# Patient Record
Sex: Male | Born: 1945 | Race: White | Hispanic: No | Marital: Single | Smoking: Never smoker
Health system: Southern US, Community
[De-identification: ages and names within clinical notes are randomized; demographics above are authoritative.]

## PROBLEM LIST (undated history)

## (undated) DIAGNOSIS — K7689 Other specified diseases of liver: Secondary | ICD-10-CM

## (undated) DIAGNOSIS — E7800 Pure hypercholesterolemia, unspecified: Secondary | ICD-10-CM

## (undated) DIAGNOSIS — E119 Type 2 diabetes mellitus without complications: Secondary | ICD-10-CM

## (undated) DIAGNOSIS — K7581 Nonalcoholic steatohepatitis (NASH): Secondary | ICD-10-CM

## (undated) DIAGNOSIS — I10 Essential (primary) hypertension: Secondary | ICD-10-CM

## (undated) DIAGNOSIS — N289 Disorder of kidney and ureter, unspecified: Secondary | ICD-10-CM

## (undated) DIAGNOSIS — E039 Hypothyroidism, unspecified: Secondary | ICD-10-CM

## (undated) DIAGNOSIS — R05 Cough: Secondary | ICD-10-CM

## (undated) DIAGNOSIS — D649 Anemia, unspecified: Secondary | ICD-10-CM

## (undated) DIAGNOSIS — M109 Gout, unspecified: Secondary | ICD-10-CM

## (undated) HISTORY — DX: Type 2 diabetes mellitus without complications: E11.9

## (undated) HISTORY — DX: Gout, unspecified: M10.9

## (undated) HISTORY — DX: Other specified diseases of liver: K76.89

## (undated) HISTORY — DX: Disorder of kidney and ureter, unspecified: N28.9

## (undated) HISTORY — DX: Cough: R05

## (undated) HISTORY — DX: Pure hypercholesterolemia, unspecified: E78.00

## (undated) HISTORY — DX: Nonalcoholic steatohepatitis (NASH): K75.81

## (undated) HISTORY — DX: Essential (primary) hypertension: I10

## (undated) HISTORY — DX: Hypothyroidism, unspecified: E03.9

## (undated) HISTORY — DX: Anemia, unspecified: D64.9

---

## 2003-11-05 ENCOUNTER — Encounter: Admission: RE | Admit: 2003-11-05 | Discharge: 2003-11-05 | Payer: Self-pay | Admitting: Endocrinology

## 2003-12-01 ENCOUNTER — Ambulatory Visit: Payer: Self-pay | Admitting: Endocrinology

## 2003-12-02 ENCOUNTER — Ambulatory Visit: Payer: Self-pay | Admitting: Endocrinology

## 2003-12-03 ENCOUNTER — Ambulatory Visit (HOSPITAL_COMMUNITY): Admission: RE | Admit: 2003-12-03 | Discharge: 2003-12-03 | Payer: Self-pay | Admitting: Endocrinology

## 2004-10-14 ENCOUNTER — Ambulatory Visit: Payer: Self-pay | Admitting: Endocrinology

## 2004-10-19 ENCOUNTER — Ambulatory Visit: Payer: Self-pay | Admitting: Endocrinology

## 2004-11-19 ENCOUNTER — Ambulatory Visit: Payer: Self-pay | Admitting: Endocrinology

## 2005-01-25 ENCOUNTER — Ambulatory Visit: Payer: Self-pay | Admitting: Endocrinology

## 2005-01-27 ENCOUNTER — Ambulatory Visit: Payer: Self-pay | Admitting: Endocrinology

## 2005-05-27 ENCOUNTER — Ambulatory Visit: Payer: Self-pay | Admitting: Endocrinology

## 2005-05-31 ENCOUNTER — Ambulatory Visit: Payer: Self-pay | Admitting: Endocrinology

## 2006-01-16 ENCOUNTER — Ambulatory Visit: Payer: Self-pay | Admitting: Endocrinology

## 2006-01-16 LAB — CONVERTED CEMR LAB
ALT: 33 units/L (ref 0–40)
AST: 32 units/L (ref 0–37)
Albumin: 3.9 g/dL (ref 3.5–5.2)
Alkaline Phosphatase: 50 units/L (ref 39–117)
BUN: 14 mg/dL (ref 6–23)
Basophils Absolute: 0.1 10*3/uL (ref 0.0–0.1)
Basophils Relative: 0.9 % (ref 0.0–1.0)
Bilirubin Urine: NEGATIVE
CO2: 32 meq/L (ref 19–32)
Calcium: 9.3 mg/dL (ref 8.4–10.5)
Chloride: 101 meq/L (ref 96–112)
Chol/HDL Ratio, serum: 4.1
Cholesterol: 172 mg/dL (ref 0–200)
Creatinine, Ser: 1.4 mg/dL (ref 0.4–1.5)
Creatinine,U: 189.7 mg/dL
Eosinophil percent: 2.9 % (ref 0.0–5.0)
GFR calc non Af Amer: 55 mL/min
Glomerular Filtration Rate, Af Am: 67 mL/min/{1.73_m2}
Glucose, Bld: 129 mg/dL — ABNORMAL HIGH (ref 70–99)
HCT: 45.8 % (ref 39.0–52.0)
HDL: 41.6 mg/dL (ref >39.0)
Hemoglobin, Urine: NEGATIVE
Hemoglobin: 15.4 g/dL (ref 13.0–17.0)
Hgb A1c MFr Bld: 6 % (ref 4.6–6.0)
Ketones, ur: NEGATIVE mg/dL
LDL DIRECT: 74.9 mg/dL
Leukocytes, UA: NEGATIVE
Lymphocytes Relative: 42.7 % (ref 12.0–46.0)
MCHC: 33.7 g/dL (ref 30.0–36.0)
MCV: 90.6 fL (ref 78.0–100.0)
Microalb Creat Ratio: 4.2 mg/g (ref 0.0–30.0)
Microalb, Ur: 0.8 mg/dL (ref 0.0–1.9)
Monocytes Absolute: 0.7 10*3/uL (ref 0.2–0.7)
Monocytes Relative: 7 % (ref 3.0–11.0)
Neutro Abs: 5 10*3/uL (ref 1.4–7.7)
Neutrophils Relative %: 46.5 % (ref 43.0–77.0)
Nitrite: NEGATIVE
PSA: 0.78 ng/mL (ref 0.10–4.00)
Platelets: 239 10*3/uL (ref 150–400)
Potassium: 4.6 meq/L (ref 3.5–5.1)
RBC: 5.06 M/uL (ref 4.22–5.81)
RDW: 14 % (ref 11.5–14.6)
Sodium: 139 meq/L (ref 135–145)
Specific Gravity, Urine: 1.02 (ref 1.000–1.03)
TSH: 4.94 microintl units/mL (ref 0.35–5.50)
Total Bilirubin: 0.9 mg/dL (ref 0.3–1.2)
Total Protein, Urine: NEGATIVE mg/dL
Total Protein: 7.4 g/dL (ref 6.0–8.3)
Triglyceride fasting, serum: 400 mg/dL (ref 0–149)
Uric Acid, Serum: 4.9 mg/dL (ref 2.4–7.0)
Urine Glucose: NEGATIVE mg/dL
Urobilinogen, UA: 0.2 (ref 0.0–1.0)
VLDL: 80 mg/dL — ABNORMAL HIGH (ref 0–40)
WBC: 10.6 10*3/uL — ABNORMAL HIGH (ref 4.5–10.5)
pH: 6 (ref 5.0–8.0)

## 2006-01-19 ENCOUNTER — Ambulatory Visit: Payer: Self-pay | Admitting: Endocrinology

## 2006-02-06 ENCOUNTER — Encounter: Admission: RE | Admit: 2006-02-06 | Discharge: 2006-02-06 | Payer: Self-pay | Admitting: Endocrinology

## 2006-07-20 ENCOUNTER — Ambulatory Visit: Payer: Self-pay | Admitting: Endocrinology

## 2006-07-20 LAB — CONVERTED CEMR LAB
ALT: 32 units/L (ref 0–53)
AST: 30 units/L (ref 0–37)
Albumin: 4 g/dL (ref 3.5–5.2)
Alkaline Phosphatase: 80 units/L (ref 39–117)
Bilirubin, Direct: 0.1 mg/dL (ref 0.0–0.3)
Cholesterol: 228 mg/dL (ref 0–200)
Direct LDL: 55.5 mg/dL
HDL: 42.5 mg/dL (ref >39.0)
Hgb A1c MFr Bld: 13.5 % — ABNORMAL HIGH (ref 4.6–6.0)
Total Bilirubin: 1.4 mg/dL — ABNORMAL HIGH (ref 0.3–1.2)
Total CHOL/HDL Ratio: 5.4
Total Protein: 7.9 g/dL (ref 6.0–8.3)
Triglycerides: 978 mg/dL (ref 0–149)
Uric Acid, Serum: 5.3 mg/dL (ref 2.4–7.0)
VLDL: 196 mg/dL — ABNORMAL HIGH (ref 0–40)

## 2006-07-21 ENCOUNTER — Ambulatory Visit: Payer: Self-pay | Admitting: Endocrinology

## 2006-07-28 ENCOUNTER — Ambulatory Visit: Payer: Self-pay | Admitting: Endocrinology

## 2006-08-17 ENCOUNTER — Encounter: Payer: Self-pay | Admitting: Endocrinology

## 2006-08-17 DIAGNOSIS — I10 Essential (primary) hypertension: Secondary | ICD-10-CM

## 2006-08-17 DIAGNOSIS — E039 Hypothyroidism, unspecified: Secondary | ICD-10-CM | POA: Insufficient documentation

## 2006-08-17 HISTORY — DX: Essential (primary) hypertension: I10

## 2006-08-17 HISTORY — DX: Hypothyroidism, unspecified: E03.9

## 2006-08-25 ENCOUNTER — Ambulatory Visit: Payer: Self-pay | Admitting: Endocrinology

## 2006-10-23 ENCOUNTER — Ambulatory Visit: Payer: Self-pay | Admitting: Endocrinology

## 2006-10-23 LAB — CONVERTED CEMR LAB
ALT: 21 units/L (ref 0–53)
AST: 28 units/L (ref 0–37)
Albumin: 4.1 g/dL (ref 3.5–5.2)
Alkaline Phosphatase: 41 units/L (ref 39–117)
BUN: 20 mg/dL (ref 6–23)
Bilirubin, Direct: 0.1 mg/dL (ref 0.0–0.3)
CO2: 30 meq/L (ref 19–32)
Calcium: 9.8 mg/dL (ref 8.4–10.5)
Chloride: 106 meq/L (ref 96–112)
Cholesterol: 119 mg/dL (ref 0–200)
Creatinine, Ser: 1.7 mg/dL — ABNORMAL HIGH (ref 0.4–1.5)
GFR calc Af Amer: 53 mL/min
GFR calc non Af Amer: 44 mL/min
Glucose, Bld: 98 mg/dL (ref 70–99)
HDL: 40.3 mg/dL (ref >39.0)
Hgb A1c MFr Bld: 5.7 % (ref 4.6–6.0)
LDL Cholesterol: 50 mg/dL (ref 0–99)
Potassium: 4.4 meq/L (ref 3.5–5.1)
Sodium: 144 meq/L (ref 135–145)
Total Bilirubin: 0.6 mg/dL (ref 0.3–1.2)
Total CHOL/HDL Ratio: 3
Total Protein: 7.4 g/dL (ref 6.0–8.3)
Triglycerides: 144 mg/dL (ref 0–149)
VLDL: 29 mg/dL (ref 0–40)

## 2006-10-25 ENCOUNTER — Ambulatory Visit: Payer: Self-pay | Admitting: Endocrinology

## 2007-02-14 ENCOUNTER — Encounter: Payer: Self-pay | Admitting: Endocrinology

## 2007-02-15 ENCOUNTER — Encounter: Payer: Self-pay | Admitting: Endocrinology

## 2007-04-27 ENCOUNTER — Ambulatory Visit: Payer: Self-pay | Admitting: Endocrinology

## 2007-04-29 LAB — CONVERTED CEMR LAB
ALT: 22 units/L (ref 0–53)
AST: 32 units/L (ref 0–37)
Albumin: 4.3 g/dL (ref 3.5–5.2)
Alkaline Phosphatase: 54 units/L (ref 39–117)
BUN: 27 mg/dL — ABNORMAL HIGH (ref 6–23)
Basophils Absolute: 0 10*3/uL (ref 0.0–0.1)
Basophils Relative: 0 % (ref 0.0–1.0)
Bilirubin Urine: NEGATIVE
Bilirubin, Direct: 0.1 mg/dL (ref 0.0–0.3)
CO2: 34 meq/L — ABNORMAL HIGH (ref 19–32)
Calcium: 10.1 mg/dL (ref 8.4–10.5)
Chloride: 101 meq/L (ref 96–112)
Cholesterol: 133 mg/dL (ref 0–200)
Creatinine, Ser: 1.5 mg/dL (ref 0.4–1.5)
Eosinophils Absolute: 0.2 10*3/uL (ref 0.0–0.7)
Eosinophils Relative: 1.6 % (ref 0.0–5.0)
GFR calc Af Amer: 62 mL/min
GFR calc non Af Amer: 51 mL/min
Glucose, Bld: 105 mg/dL — ABNORMAL HIGH (ref 70–99)
HCT: 46.6 % (ref 39.0–52.0)
HDL: 43.5 mg/dL (ref >39.0)
Hemoglobin, Urine: NEGATIVE
Hemoglobin: 15.5 g/dL (ref 13.0–17.0)
Hgb A1c MFr Bld: 5.7 % (ref 4.6–6.0)
Ketones, ur: NEGATIVE mg/dL
LDL Cholesterol: 60 mg/dL (ref 0–99)
Leukocytes, UA: NEGATIVE
Lymphocytes Relative: 42.1 % (ref 12.0–46.0)
MCHC: 33.2 g/dL (ref 30.0–36.0)
MCV: 91.9 fL (ref 78.0–100.0)
Monocytes Absolute: 0.7 10*3/uL (ref 0.1–1.0)
Monocytes Relative: 6.2 % (ref 3.0–12.0)
Neutro Abs: 5.6 10*3/uL (ref 1.4–7.7)
Neutrophils Relative %: 50.1 % (ref 43.0–77.0)
Nitrite: NEGATIVE
PSA: 0.86 ng/mL (ref 0.10–4.00)
Platelets: 213 10*3/uL (ref 150–400)
Potassium: 4.1 meq/L (ref 3.5–5.1)
RBC: 5.07 M/uL (ref 4.22–5.81)
RDW: 14.3 % (ref 11.5–14.6)
Sodium: 142 meq/L (ref 135–145)
Specific Gravity, Urine: >=1.03 (ref 1.000–1.03)
TSH: 5.71 microintl units/mL — ABNORMAL HIGH (ref 0.35–5.50)
Total Bilirubin: 0.7 mg/dL (ref 0.3–1.2)
Total CHOL/HDL Ratio: 3.1
Total Protein, Urine: 30 mg/dL — AB
Total Protein: 8 g/dL (ref 6.0–8.3)
Triglycerides: 149 mg/dL (ref 0–149)
Uric Acid, Serum: 4 mg/dL (ref 4.0–7.8)
Urine Glucose: NEGATIVE mg/dL
Urobilinogen, UA: 0.2 (ref 0.0–1.0)
VLDL: 30 mg/dL (ref 0–40)
WBC: 11.3 10*3/uL — ABNORMAL HIGH (ref 4.5–10.5)
pH: 5 (ref 5.0–8.0)

## 2007-05-02 ENCOUNTER — Ambulatory Visit: Payer: Self-pay | Admitting: Endocrinology

## 2007-05-02 DIAGNOSIS — E1169 Type 2 diabetes mellitus with other specified complication: Secondary | ICD-10-CM | POA: Insufficient documentation

## 2007-05-02 DIAGNOSIS — E7800 Pure hypercholesterolemia, unspecified: Secondary | ICD-10-CM

## 2007-05-02 DIAGNOSIS — E785 Hyperlipidemia, unspecified: Secondary | ICD-10-CM

## 2007-05-02 HISTORY — DX: Pure hypercholesterolemia, unspecified: E78.00

## 2007-05-18 ENCOUNTER — Ambulatory Visit: Payer: Self-pay | Admitting: Internal Medicine

## 2007-06-01 ENCOUNTER — Ambulatory Visit: Payer: Self-pay | Admitting: Internal Medicine

## 2007-06-01 ENCOUNTER — Encounter: Payer: Self-pay | Admitting: Internal Medicine

## 2007-06-01 LAB — HM COLONOSCOPY

## 2007-06-07 ENCOUNTER — Encounter: Payer: Self-pay | Admitting: Internal Medicine

## 2007-08-22 IMAGING — US US CAROTID DUPLEX BILAT
1 series · 14 of 24 positions shown · non-contrast
Comparison: none

CLINICAL DATA: Hypertension.  Abnormal ophthalmologic exam.
 BILATERAL CAROTID DUPLEX DOPPLER ULTRASOUND:
 The following Doppler flow velocity measurements were obtained (in cm/sec):

[Series 1: unknown · 0.07mm/px · 14 of 52 slices shown]
[im 1/52]
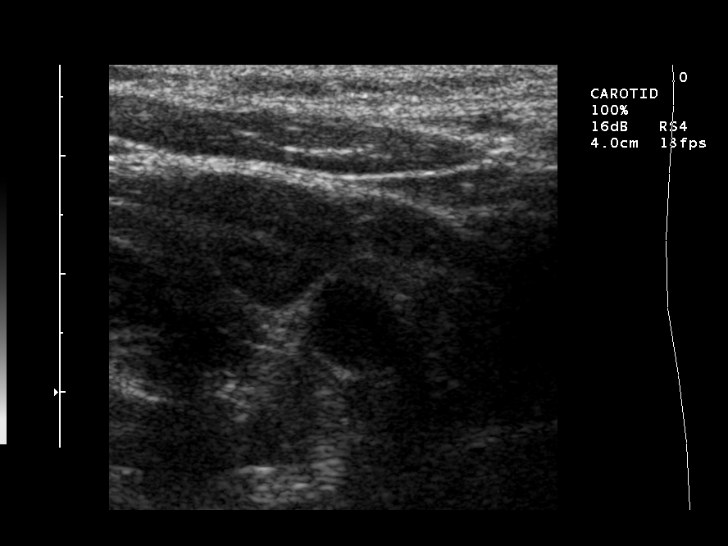
[im 5/52]
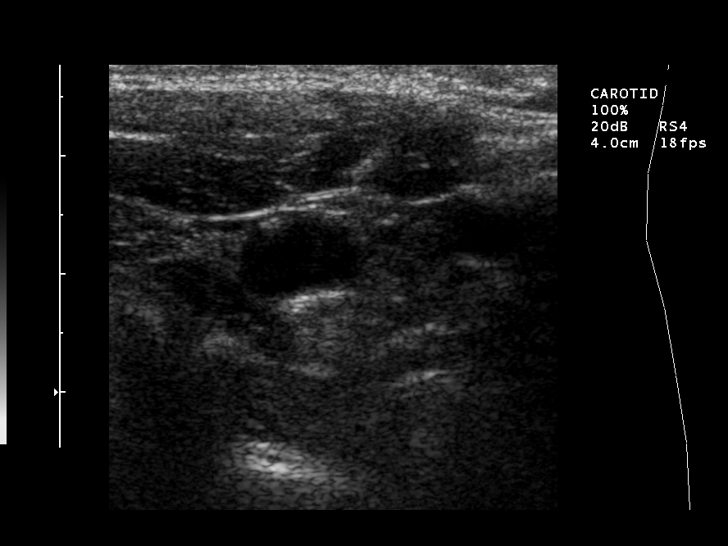
[im 9/52]
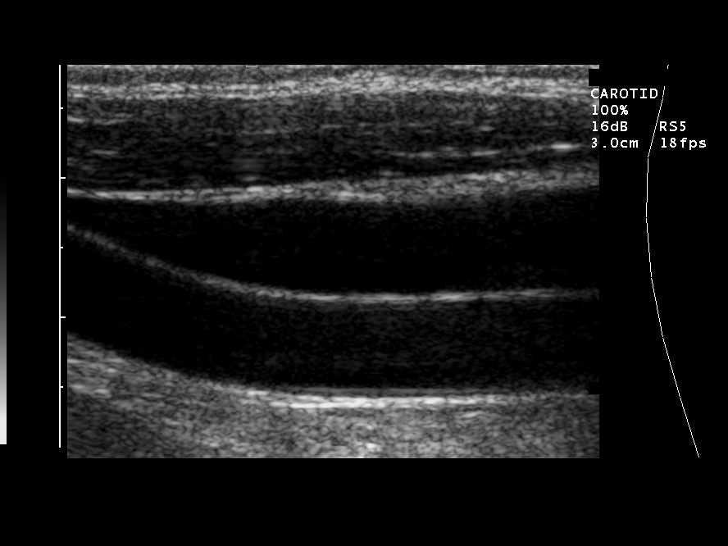
[im 14/52]
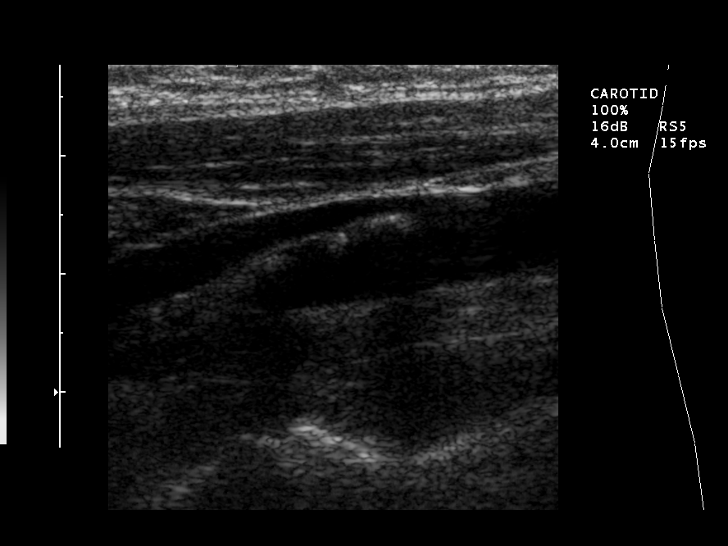
[im 16/52]
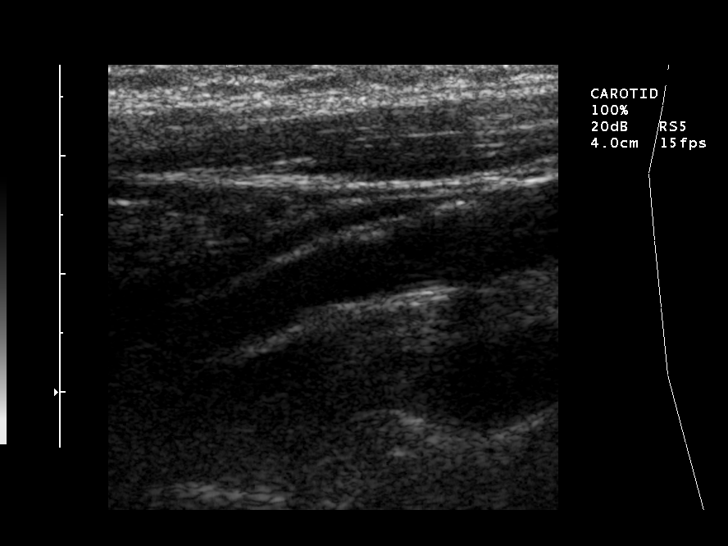
[im 20/52]
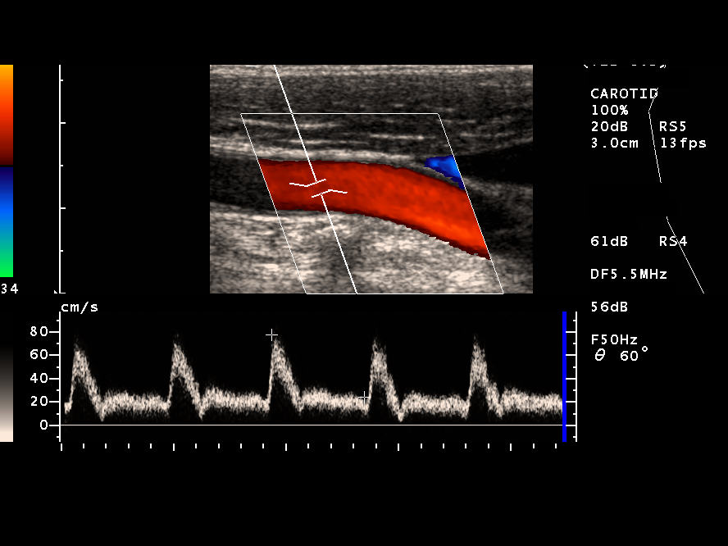
[im 25/52]
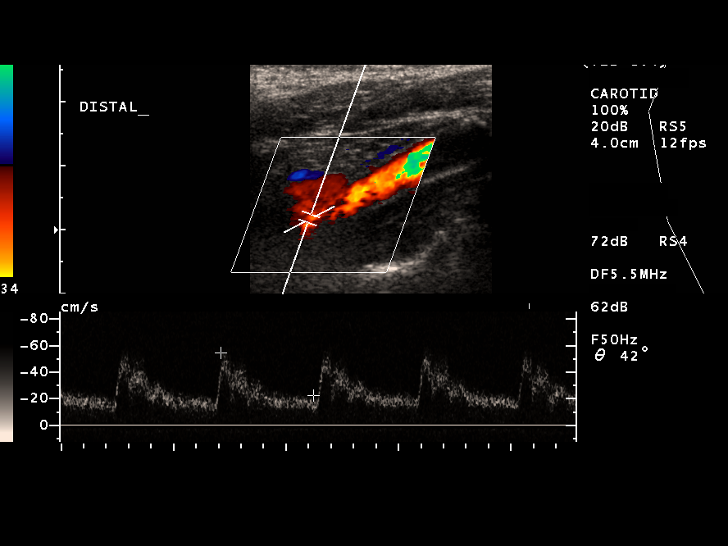
[im 27/52]
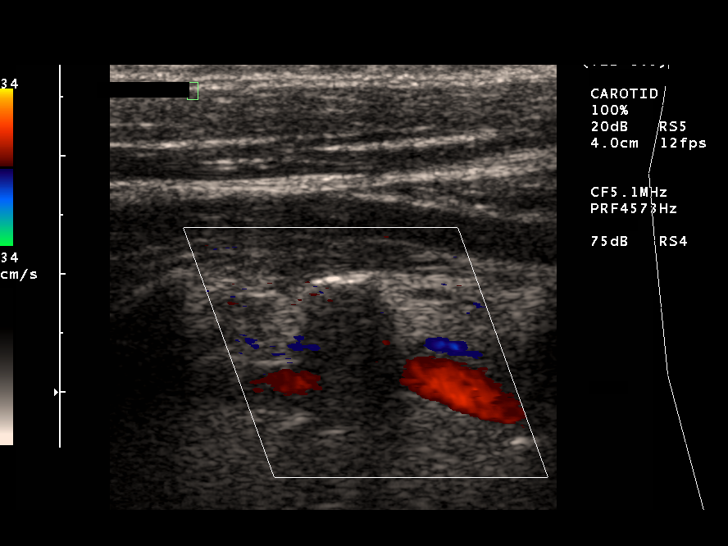
[im 32/52]
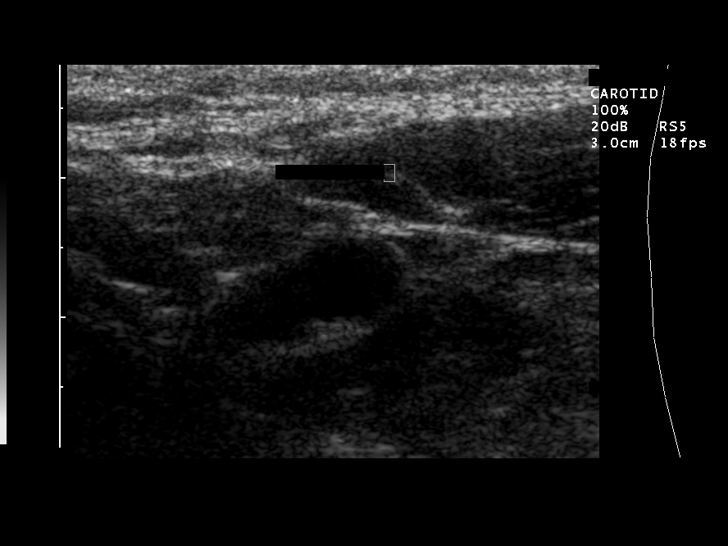
[im 36/52]
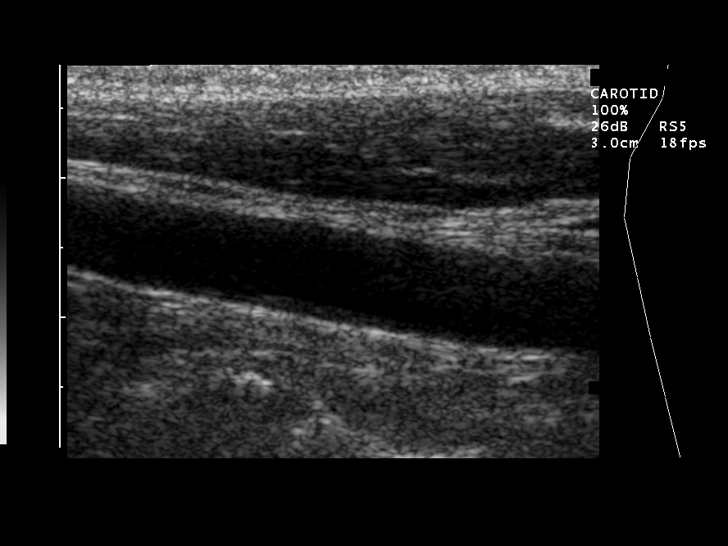
[im 40/52]
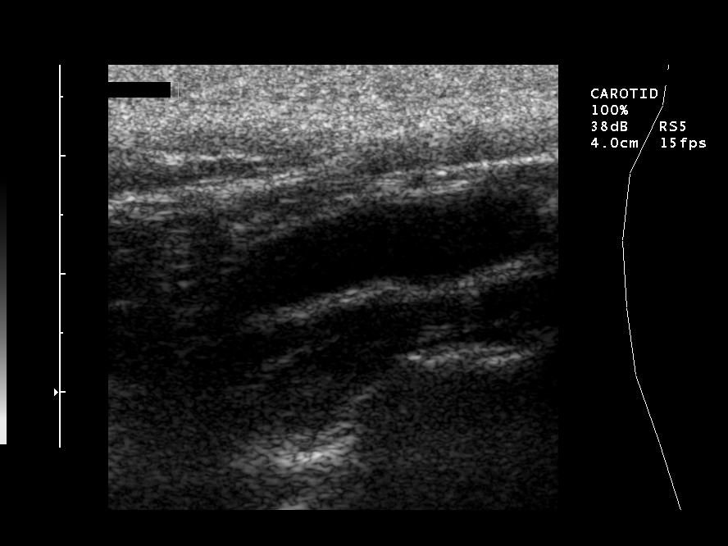
[im 43/52]
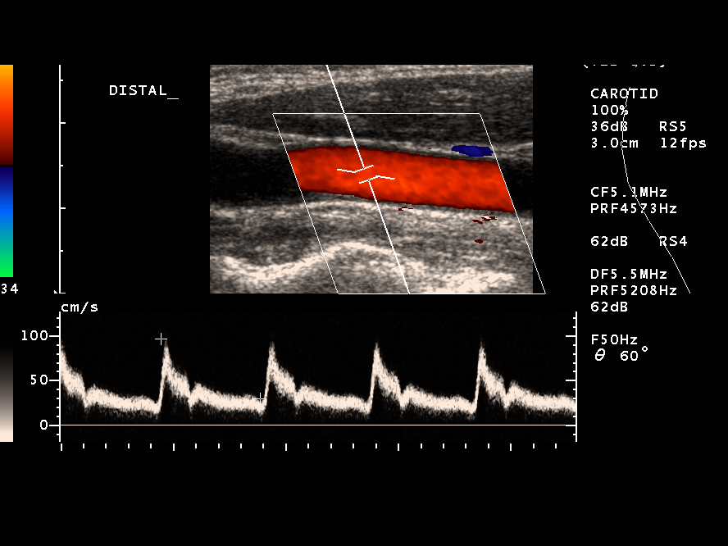
[im 47/52]
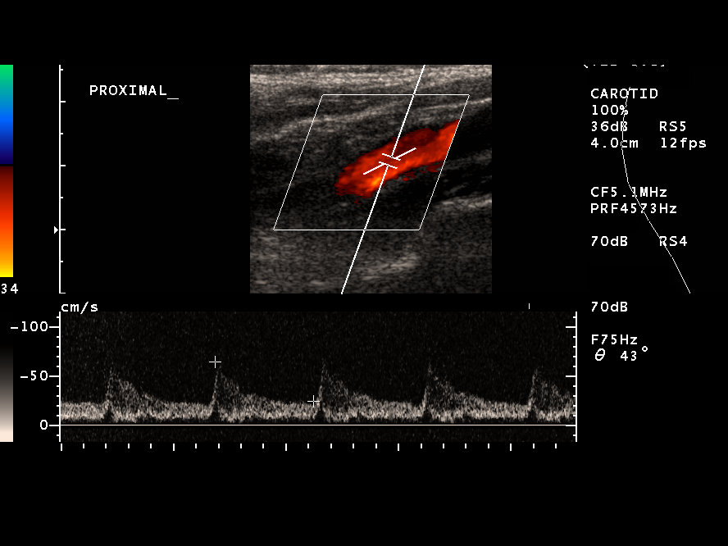
[im 52/52]
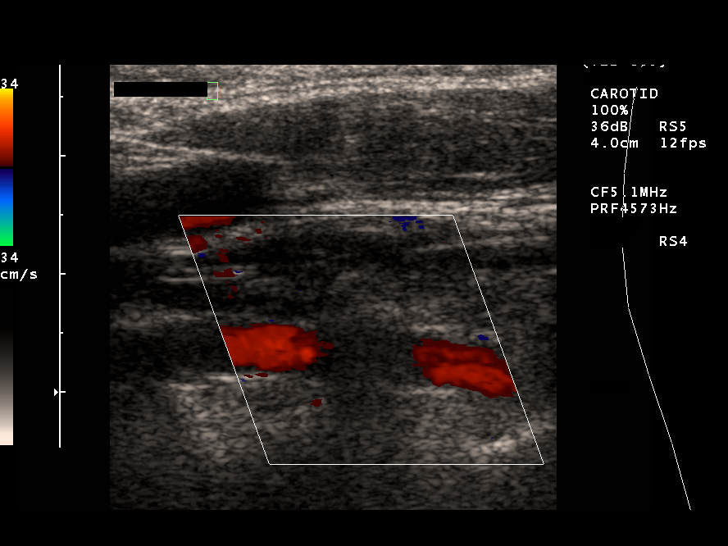

[14 of 24 positions shown; findings below may reference images not displayed]

SITE:  PEAK SYSTOLIC  END DIASTOLIC

 RIGHT  ICA:      89  31
 RIGHT CCA:  77  24
 RIGHT ICA/CCA RATIO:
 RIGHT ECA:  105
 LEFT ICA:  74  25
 LEFT CCA:  96    29
 LEFT ICA/CCA RATIO:  .77  .84
 LEFT ECA:  80
 Criteria:  Quantification of carotid stenosis is based on velocity parameters that correlate the residual internal carotid diameter with NASCET-based stenosis levels.
FINDINGS: There is minimal soft and calcified plaque in the right ICA bulb.  Doppler analysis demonstrates a low resistance waveform with sharp upstroke.  The right vertebral is antegrade in flow.  There is minimal predominantly soft plaque in the left ICA bulb.  Doppler analysis demonstrates a low resistance waveform with sharp upstroke.  The left vertebral antegrade in flow.
IMPRESSION: Estimated stenosis in the right and left ICA?s is 0 to 50% and 0 to 50% respectively.

## 2008-06-06 ENCOUNTER — Ambulatory Visit: Payer: Self-pay | Admitting: Endocrinology

## 2008-06-06 LAB — CONVERTED CEMR LAB
ALT: 59 units/L — ABNORMAL HIGH (ref 0–53)
AST: 41 units/L — ABNORMAL HIGH (ref 0–37)
Albumin: 4 g/dL (ref 3.5–5.2)
Alkaline Phosphatase: 63 units/L (ref 39–117)
BUN: 30 mg/dL — ABNORMAL HIGH (ref 6–23)
Basophils Absolute: 0.1 10*3/uL (ref 0.0–0.1)
Basophils Relative: 0.9 % (ref 0.0–3.0)
Bilirubin Urine: NEGATIVE
Bilirubin, Direct: 0.1 mg/dL (ref 0.0–0.3)
CO2: 29 meq/L (ref 19–32)
Calcium: 9.6 mg/dL (ref 8.4–10.5)
Chloride: 107 meq/L (ref 96–112)
Cholesterol: 197 mg/dL (ref 0–200)
Creatinine, Ser: 1.9 mg/dL — ABNORMAL HIGH (ref 0.4–1.5)
Direct LDL: 85.2 mg/dL
Eosinophils Absolute: 0.4 10*3/uL (ref 0.0–0.7)
Eosinophils Relative: 3.4 % (ref 0.0–5.0)
GFR calc non Af Amer: 38.49 mL/min (ref >60)
Glucose, Bld: 200 mg/dL — ABNORMAL HIGH (ref 70–99)
HCT: 44.5 % (ref 39.0–52.0)
HDL: 38.9 mg/dL — ABNORMAL LOW (ref >39.00)
Hemoglobin, Urine: NEGATIVE
Hemoglobin: 15.4 g/dL (ref 13.0–17.0)
Ketones, ur: NEGATIVE mg/dL
Leukocytes, UA: NEGATIVE
Lymphocytes Relative: 43.9 % (ref 12.0–46.0)
Lymphs Abs: 4.8 10*3/uL — ABNORMAL HIGH (ref 0.7–4.0)
MCHC: 34.6 g/dL (ref 30.0–36.0)
MCV: 90.7 fL (ref 78.0–100.0)
Monocytes Absolute: 0.7 10*3/uL (ref 0.1–1.0)
Monocytes Relative: 6.6 % (ref 3.0–12.0)
Neutro Abs: 5 10*3/uL (ref 1.4–7.7)
Neutrophils Relative %: 45.2 % (ref 43.0–77.0)
Nitrite: NEGATIVE
PSA: 0.49 ng/mL (ref 0.10–4.00)
Platelets: 209 10*3/uL (ref 150.0–400.0)
Potassium: 4.5 meq/L (ref 3.5–5.1)
RBC: 4.91 M/uL (ref 4.22–5.81)
RDW: 13.9 % (ref 11.5–14.6)
Sodium: 141 meq/L (ref 135–145)
Specific Gravity, Urine: >=1.03 (ref 1.000–1.030)
TSH: 8.76 microintl units/mL — ABNORMAL HIGH (ref 0.35–5.50)
Total Bilirubin: 1.1 mg/dL (ref 0.3–1.2)
Total CHOL/HDL Ratio: 5
Total Protein, Urine: NEGATIVE mg/dL
Total Protein: 7.5 g/dL (ref 6.0–8.3)
Triglycerides: 583 mg/dL — ABNORMAL HIGH (ref 0.0–149.0)
Urine Glucose: NEGATIVE mg/dL
Urobilinogen, UA: 0.2 (ref 0.0–1.0)
VLDL: 116.6 mg/dL — ABNORMAL HIGH (ref 0.0–40.0)
WBC: 11 10*3/uL — ABNORMAL HIGH (ref 4.5–10.5)
pH: 5 (ref 5.0–8.0)

## 2008-06-12 ENCOUNTER — Ambulatory Visit: Payer: Self-pay | Admitting: Endocrinology

## 2008-06-12 DIAGNOSIS — K7689 Other specified diseases of liver: Secondary | ICD-10-CM

## 2008-06-12 DIAGNOSIS — E119 Type 2 diabetes mellitus without complications: Secondary | ICD-10-CM

## 2008-06-12 DIAGNOSIS — K7581 Nonalcoholic steatohepatitis (NASH): Secondary | ICD-10-CM

## 2008-06-12 HISTORY — DX: Type 2 diabetes mellitus without complications: E11.9

## 2008-06-12 HISTORY — DX: Other specified diseases of liver: K76.89

## 2008-06-17 ENCOUNTER — Telehealth: Payer: Self-pay | Admitting: Endocrinology

## 2008-07-08 ENCOUNTER — Telehealth (INDEPENDENT_AMBULATORY_CARE_PROVIDER_SITE_OTHER): Payer: Self-pay | Admitting: *Deleted

## 2008-09-08 ENCOUNTER — Ambulatory Visit: Payer: Self-pay | Admitting: Endocrinology

## 2008-09-09 LAB — CONVERTED CEMR LAB
ALT: 22 units/L (ref 0–53)
AST: 26 units/L (ref 0–37)
Albumin: 4.1 g/dL (ref 3.5–5.2)
Alkaline Phosphatase: 49 units/L (ref 39–117)
Bilirubin, Direct: 0.1 mg/dL (ref 0.0–0.3)
Cholesterol: 145 mg/dL (ref 0–200)
HDL: 35.8 mg/dL — ABNORMAL LOW (ref >39.00)
Hgb A1c MFr Bld: 5.6 % (ref 4.6–6.5)
LDL Cholesterol: 81 mg/dL (ref 0–99)
Total Bilirubin: 0.9 mg/dL (ref 0.3–1.2)
Total CHOL/HDL Ratio: 4
Total Protein: 8.1 g/dL (ref 6.0–8.3)
Triglycerides: 142 mg/dL (ref 0.0–149.0)
Uric Acid, Serum: 3.4 mg/dL — ABNORMAL LOW (ref 4.0–7.8)
VLDL: 28.4 mg/dL (ref 0.0–40.0)

## 2008-09-10 ENCOUNTER — Ambulatory Visit: Payer: Self-pay | Admitting: Endocrinology

## 2008-09-10 DIAGNOSIS — R059 Cough, unspecified: Secondary | ICD-10-CM

## 2008-09-10 DIAGNOSIS — R05 Cough: Secondary | ICD-10-CM | POA: Insufficient documentation

## 2008-09-10 HISTORY — DX: Cough, unspecified: R05.9

## 2008-12-17 ENCOUNTER — Encounter: Payer: Self-pay | Admitting: Endocrinology

## 2008-12-29 ENCOUNTER — Ambulatory Visit: Payer: Self-pay | Admitting: Endocrinology

## 2008-12-30 ENCOUNTER — Ambulatory Visit: Payer: Self-pay | Admitting: Endocrinology

## 2008-12-30 LAB — CONVERTED CEMR LAB
Cholesterol: 171 mg/dL (ref 0–200)
Direct LDL: 93.3 mg/dL
HDL: 38.8 mg/dL — ABNORMAL LOW (ref >39.00)
Hgb A1c MFr Bld: 5.7 % (ref 4.6–6.5)
TSH: 2.38 microintl units/mL (ref 0.35–5.50)
Total CHOL/HDL Ratio: 4
Triglycerides: 221 mg/dL — ABNORMAL HIGH (ref 0.0–149.0)
VLDL: 44.2 mg/dL — ABNORMAL HIGH (ref 0.0–40.0)

## 2009-01-08 ENCOUNTER — Telehealth: Payer: Self-pay | Admitting: Endocrinology

## 2009-05-12 ENCOUNTER — Telehealth: Payer: Self-pay | Admitting: Endocrinology

## 2009-06-21 ENCOUNTER — Emergency Department (HOSPITAL_COMMUNITY): Admission: EM | Admit: 2009-06-21 | Discharge: 2009-06-21 | Payer: Self-pay | Admitting: Emergency Medicine

## 2009-06-22 ENCOUNTER — Telehealth: Payer: Self-pay | Admitting: Internal Medicine

## 2009-06-29 ENCOUNTER — Ambulatory Visit: Payer: Self-pay | Admitting: Endocrinology

## 2009-06-29 LAB — CONVERTED CEMR LAB
ALT: 65 units/L — ABNORMAL HIGH (ref 0–53)
AST: 54 units/L — ABNORMAL HIGH (ref 0–37)
Albumin: 4.2 g/dL (ref 3.5–5.2)
Alkaline Phosphatase: 58 units/L (ref 39–117)
BUN: 16 mg/dL (ref 6–23)
Basophils Absolute: 0 10*3/uL (ref 0.0–0.1)
Basophils Relative: 0.5 % (ref 0.0–3.0)
Bilirubin Urine: NEGATIVE
Bilirubin, Direct: 0.2 mg/dL (ref 0.0–0.3)
CO2: 30 meq/L (ref 19–32)
Calcium: 9.6 mg/dL (ref 8.4–10.5)
Chloride: 102 meq/L (ref 96–112)
Cholesterol: 160 mg/dL (ref 0–200)
Creatinine, Ser: 1.4 mg/dL (ref 0.4–1.5)
Direct LDL: 89.2 mg/dL
Eosinophils Absolute: 0.2 10*3/uL (ref 0.0–0.7)
Eosinophils Relative: 2.5 % (ref 0.0–5.0)
GFR calc non Af Amer: 53.25 mL/min (ref >60)
Glucose, Bld: 149 mg/dL — ABNORMAL HIGH (ref 70–99)
HCT: 28.2 % — ABNORMAL LOW (ref 39.0–52.0)
HDL: 37.8 mg/dL — ABNORMAL LOW (ref >39.00)
Hemoglobin, Urine: NEGATIVE
Hemoglobin: 9.7 g/dL — ABNORMAL LOW (ref 13.0–17.0)
Hgb A1c MFr Bld: 6.7 % — ABNORMAL HIGH (ref 4.6–6.5)
Ketones, ur: NEGATIVE mg/dL
Leukocytes, UA: NEGATIVE
Lymphocytes Relative: 16.4 % (ref 12.0–46.0)
Lymphs Abs: 1.3 10*3/uL (ref 0.7–4.0)
MCHC: 34.4 g/dL (ref 30.0–36.0)
MCV: 91.6 fL (ref 78.0–100.0)
Monocytes Absolute: 1 10*3/uL (ref 0.1–1.0)
Monocytes Relative: 13.2 % — ABNORMAL HIGH (ref 3.0–12.0)
Neutro Abs: 5.2 10*3/uL (ref 1.4–7.7)
Neutrophils Relative %: 67.4 % (ref 43.0–77.0)
Nitrite: NEGATIVE
PSA: 0.54 ng/mL (ref 0.10–4.00)
Platelets: 247 10*3/uL (ref 150.0–400.0)
Potassium: 4.5 meq/L (ref 3.5–5.1)
RBC: 3.08 M/uL — ABNORMAL LOW (ref 4.22–5.81)
RDW: 15.9 % — ABNORMAL HIGH (ref 11.5–14.6)
Sodium: 139 meq/L (ref 135–145)
Specific Gravity, Urine: >=1.03 (ref 1.000–1.030)
TSH: 6.19 microintl units/mL — ABNORMAL HIGH (ref 0.35–5.50)
Total Bilirubin: 1 mg/dL (ref 0.3–1.2)
Total CHOL/HDL Ratio: 4
Total Protein, Urine: NEGATIVE mg/dL
Total Protein: 7.5 g/dL (ref 6.0–8.3)
Triglycerides: 291 mg/dL — ABNORMAL HIGH (ref 0.0–149.0)
Urine Glucose: NEGATIVE mg/dL
Urobilinogen, UA: 1 (ref 0.0–1.0)
VLDL: 58.2 mg/dL — ABNORMAL HIGH (ref 0.0–40.0)
WBC: 7.7 10*3/uL (ref 4.5–10.5)
pH: 5.5 (ref 5.0–8.0)

## 2009-07-01 ENCOUNTER — Ambulatory Visit: Payer: Self-pay | Admitting: Endocrinology

## 2009-07-01 DIAGNOSIS — D649 Anemia, unspecified: Secondary | ICD-10-CM

## 2009-07-01 DIAGNOSIS — M109 Gout, unspecified: Secondary | ICD-10-CM

## 2009-07-01 HISTORY — DX: Anemia, unspecified: D64.9

## 2009-07-01 HISTORY — DX: Gout, unspecified: M10.9

## 2009-07-02 ENCOUNTER — Telehealth: Payer: Self-pay | Admitting: Endocrinology

## 2009-09-02 ENCOUNTER — Encounter: Payer: Self-pay | Admitting: Endocrinology

## 2009-09-30 ENCOUNTER — Ambulatory Visit: Payer: Self-pay | Admitting: Endocrinology

## 2009-09-30 LAB — CONVERTED CEMR LAB
Hgb A1c MFr Bld: 6.6 % — ABNORMAL HIGH (ref 4.6–6.5)
TSH: 0.34 microintl units/mL — ABNORMAL LOW (ref 0.35–5.50)
Uric Acid, Serum: 3.1 mg/dL — ABNORMAL LOW (ref 4.0–7.8)

## 2010-02-07 LAB — CONVERTED CEMR LAB
Basophils Absolute: 0 10*3/uL (ref 0.0–0.1)
Basophils Relative: 0.4 % (ref 0.0–3.0)
Eosinophils Absolute: 0.2 10*3/uL (ref 0.0–0.7)
Eosinophils Relative: 1.7 % (ref 0.0–5.0)
Folate: >20 ng/mL
HCT: 44 % (ref 39.0–52.0)
Hemoglobin: 15.4 g/dL (ref 13.0–17.0)
Iron: 63 ug/dL (ref 42–165)
Lymphocytes Relative: 31.6 % (ref 12.0–46.0)
Lymphs Abs: 3.1 10*3/uL (ref 0.7–4.0)
MCHC: 35 g/dL (ref 30.0–36.0)
MCV: 90.1 fL (ref 78.0–100.0)
Monocytes Absolute: 0.6 10*3/uL (ref 0.1–1.0)
Monocytes Relative: 5.9 % (ref 3.0–12.0)
Neutro Abs: 5.9 10*3/uL (ref 1.4–7.7)
Neutrophils Relative %: 60.4 % (ref 43.0–77.0)
Platelets: 259 10*3/uL (ref 150.0–400.0)
RBC: 4.88 M/uL (ref 4.22–5.81)
RDW: 14.2 % (ref 11.5–14.6)
Saturation Ratios: 15.3 % — ABNORMAL LOW (ref 20.0–50.0)
Transferrin: 294.7 mg/dL (ref 212.0–360.0)
Uric Acid, Serum: 2.8 mg/dL — ABNORMAL LOW (ref 4.0–7.8)
Vitamin B-12: 384 pg/mL (ref 211–911)
WBC: 9.8 10*3/uL (ref 4.5–10.5)

## 2010-02-09 NOTE — Assessment & Plan Note (Signed)
Summary: 6 MTH PHYSICAL STC   Vital Signs:  Patient profile:   65 year old male Height:      68 inches Weight:      241.50 pounds BMI:     36.85 O2 Sat:      95 % on Room air Temp:     97.0 degrees F oral Pulse rate:   81 / minute BP sitting:   172 / 90  (left arm) Cuff size:   regular  Vitals Entered By: Margaret Pyle, CMA (July 01, 2009 9:49 AM)  O2 Flow:  Room air CC: Physical   CC:  Physical.  History of Present Illness: here for regular wellness examination.  He's feeling pretty well in general, and does not smoke.  alcohol is rare.   Current Medications (verified): 1)  Adult Aspirin Low Strength 81 Mg  Tbdp (Aspirin) .... Take 1 By Mouth Qd 2)  Allopurinol 300 Mg  Tabs (Allopurinol) .... Take 1 By Mouth Once Daily 3)  Metoprolol Succinate 100 Mg  Xr24h-Tab (Metoprolol Succinate) .... Take 1 By Mouth Qd 4)  Simvastatin 40 Mg Tabs (Simvastatin) .Marland Kitchen.. 1 Qd 5)  Levothyroxine Sodium 125 Mcg Tabs (Levothyroxine Sodium) .Marland Kitchen.. 1 Qd 6)  Hyzaar 100-12.5 Mg Tabs (Losartan Potassium-Hctz) .Marland Kitchen.. 1 Qd 7)  Ondansetron 4 Mg Tbdp (Ondansetron) .Marland Kitchen.. 1 Q4h As Needed Nausea 8)  Onetouch Ultrasoft Lancets  Misc (Lancets) .... Use As Directed 9)  Onetouch Ultra Test  Strp (Glucose Blood) .... Use Test Strips As Directed 10)  Fenofibrate 54 Mg Tabs (Fenofibrate) .Marland Kitchen.. 1 Qd 11)  Amlodipine Besylate 2.5 Mg Tabs (Amlodipine Besylate) .Marland Kitchen.. 1 Once Daily 12)  Metformin Hcl 500 Mg Xr24h-Tab (Metformin Hcl) .Marland Kitchen.. 1 By Mouth Two Times A Day  Allergies (verified): 1)  ! Penicillin 2)  ! Tetracycline 3)  ! Actos (Pioglitazone Hcl)  Family History: Reviewed history from 05/02/2007 and no changes required. sister had breast cancer mother had breast cancer  Social History: Reviewed history from 05/02/2007 and no changes required. 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  The patient denies fever, vision loss, decreased hearing,  chest pain, syncope, prolonged cough, headaches, abdominal pain, melena, hematochezia, severe indigestion/heartburn, hematuria, suspicious skin lesions, and depression.    Physical Exam  General:  obese.  no distress  Head:  head: no deformity eyes: no periorbital swelling, no proptosis external nose and ears are normal mouth: no lesion seen Neck:  Supple without thyroid enlargement or tenderness.  Lungs:  Clear to auscultation bilaterally. Normal respiratory effort.  Heart:  Regular rate and rhythm without murmurs or gallops noted. Normal S1,S2.   Abdomen:  abdomen is soft, nontender.  no hepatosplenomegaly.   not distended.  no hernia  Rectal:  normal external and internal exam.  heme neg  Prostate:  Normal size prostate without masses or tenderness.  Msk:  muscle bulk and strength are grossly normal.  no obvious joint swelling.  gait is normal and steady  Neurologic:  cn 2-12 grossly intact.   readily moves all 4's.   sensation is intact to touch on the feet  Skin:  normal texture and temp.  no rash.  not diaphoretic  Cervical Nodes:  No significant adenopathy.  Psych:  Alert and cooperative; normal mood and affect; normal attention span and concentration.   Additional Exam:  SEPARATE EVALUATION FOLLOWS--EACH PROBLEM HERE IS NEW, NOT RESPONDING TO TREATMENT, OR POSES SIGNIFICANT RISK TO  THE PATIENT'S HEALTH: HISTORY OF THE PRESENT ILLNESS: hypothyroidism:  pt states fatigue recently htn:  he takes and tolerates hyzaar well PAST MEDICAL HISTORY reviewed and up to date today REVIEW OF SYSTEMS: denies sob  PHYSICAL EXAMINATION: no deformity.  no ulcer on the feet.  feet are of normal color and temp.  no edema dorsalis pedis intact bilat.  no carotid bruit see vs page LAB/XRAY RESULTS: FastTSH              [H]  6.19 uIU/mL   IMPRESSION: htn, needs increased rx hypothyroidism, needs increased rx PLAN: see instruction page   Impression & Recommendations:  Problem # 1:   ROUTINE GENERAL MEDICAL EXAM@HEALTH  CARE FACL (ICD-V70.0)  Medications Added to Medication List This Visit: 1)  Onetouch Ultrasoft Lancets Misc (Lancets) .... Use qd as directed 2)  Onetouch Ultra Test Strp (Glucose blood) .... Use test strips qd as directed 3)  Levothyroxine Sodium 150 Mcg Tabs (Levothyroxine sodium) .Marland Kitchen.. 1 once daily 4)  Losartan Potassium-hctz 100-25 Mg Tabs (Losartan potassium-hctz) .Marland Kitchen.. 1 once daily  Other Orders: EKG w/ Interpretation (93000) TLB-B12 + Folate Pnl ([address]_60454-U98/JXB) TLB-IBC Pnl (Iron/FE;Transferrin) (83550-IBC) TLB-CBC Platelet - w/Differential (85025-CBCD) TLB-Uric Acid, Blood (84550-URIC) Est. Patient Level III (14782) Est. Patient 40-64 years ([address])  Patient Instructions: 1)  blood tests are being ordered for you today.  please call [phone] to hear your test results. 2)  here are some cards to test for blood in the bowels.   3)  increase levothyroxine to 150 micrograms/day 4)  Please schedule a follow-up appointment in 3 months. 5)  change losartan-hctz to 100/25, 1/day. 6)  please consider these measures for your health:  minimize alcohol.  do not use tobacco products.  have a colonoscopy at least every 10 years from age 34.  keep firearms safely stored.  always use seat belts.  have working smoke alarms in your home.  see the dentist regularly.  never drive under the influence of alcohol or drugs (including prescription drugs).  those with fair skin should take precautions against the sun. 7)  please let me know what your wishes would be, if artificial life support measures should become necessary.  it is critically important to prevent falling down (keep floor areas well-lit, dry, and free of loose objects) 8)  (update: i left message on phone-tree:  anemia was apparently a lab error.  you don't need to do hemoccults). Prescriptions: ONETOUCH ULTRA TEST  STRP (GLUCOSE BLOOD) use test strips qd as directed  #50 x 11   Entered and Authorized  by:   Minus Breeding MD   Signed by:   Minus Breeding MD on 07/01/2009   Method used:   Electronically to        [facility] [address] (retail)       [address]       [place], [place]  [address]       Ph: [phone] or [phone]       Fax: [phone]   RxID:   [id number] METFORMIN HCL 500 MG XR24H-TAB (METFORMIN HCL) 1 by mouth two times a day  #60 x 11   Entered and Authorized by:   Minus Breeding MD   Signed by:   Minus Breeding MD on 07/01/2009   Method used:   Electronically to        [facility] [address] (retail)  [address]       [place], [place]  [address]       Ph: [phone] or [phone]       Fax: [phone]   RxID:   [id number] LOSARTAN POTASSIUM-HCTZ 100-25 MG TABS (LOSARTAN POTASSIUM-HCTZ) 1 once daily  #90 x 3   Entered and Authorized by:   Minus Breeding MD   Signed by:   Minus Breeding MD on 07/01/2009   Method used:   Print then Give to Patient   RxID:   [id number] LEVOTHYROXINE SODIUM 150 MCG TABS (LEVOTHYROXINE SODIUM) 1 once daily  #30 x 11   Entered and Authorized by:   Minus Breeding MD   Signed by:   Minus Breeding MD on 07/01/2009   Method used:   Electronically to        [facility] [address] [phone]* (retail)       [address]       [place], [place]  [address]       Ph: [phone] or [phone]       Fax: [phone]   RxID:   [id number]

## 2010-02-09 NOTE — Progress Notes (Signed)
Summary: Rx refill req  Phone Note Call from Patient Call back at Home Phone [phone]   Caller: Patient Reason for Call: Refill Medication Details for Reason: One Touch Ultrasoft Lancets Summary of Call: Pt wants rx for One Touch Ultrasoft Lancets [facility] to [facility] [address]please advise Initial call taken by: Brenton Grills MA,  July 02, 2009 4:36 PM  Follow-up for Phone Call        ok for once daily.  refill prn Follow-up by: Minus Breeding MD,  July 02, 2009 4:56 PM    Prescriptions: Biagio Borg LANCETS  MISC (LANCETS) use qd as directed  #100 x 3   Entered by:   Margaret Pyle, CMA   Authorized by:   Minus Breeding MD   Signed by:   Margaret Pyle, CMA on 07/03/2009   Method used:   Electronically to        [facility] [address] (retail)       [address]       [place], [place]  [address]       Ph: [phone] or [phone]       Fax: [phone]   RxID:   [id number]

## 2010-02-09 NOTE — Progress Notes (Signed)
Summary: Amlodipine-Pharmacy change  Phone Note Refill Request Message from:  Fax from Pharmacy on May 12, 2009 10:38 AM  Refills Requested: Medication #1:  AMLODIPINE BESYLATE 2.5 MG TABS 1 once daily.   Notes: [facility] [address]. Initial call taken by: Lucious Groves,  May 12, 2009 10:38 AM    Prescriptions: AMLODIPINE BESYLATE 2.5 MG TABS (AMLODIPINE BESYLATE) 1 once daily  #30 x 11   Entered by:   Lucious Groves   Authorized by:   Minus Breeding MD   Signed by:   Lucious Groves on 05/12/2009   Method used:   Electronically to        [facility] [address] (retail)       [address]       [place], [place]  [address]       Ph: [phone] or [phone]       Fax: [phone]   RxID:   [id number]

## 2010-02-09 NOTE — Letter (Signed)
Summary: [facility]   Imported By: Sherian Rein 10/01/2009 09:52:11  _____________________________________________________________________  External Attachment:    Type:   Image     Comment:   External Document

## 2010-02-09 NOTE — Progress Notes (Signed)
Summary: elevated bs  Phone Note From Other Clinic Call back at Home Phone [phone]   Caller: Alisa/ [facility] Details for Reason: FYI Summary of Call: Wanted to inform md pt was seen over weekend in the ER due to fall. Blood sugar then was elevated 291. pt told them he has appt schedule to see Dr. Everardo All 07/01/09. Serina Cowper states she call pt this am to see how he was doing. pt blood sugar this am was 222.  Initial call taken by: Orlan Leavens,  June 22, 2009 10:38 AM  Follow-up for Phone Call        Called pt to see how he was doing. he states he feel fine check BS this am it was 220. Pt states he use to take metformin in the past but since BS was doing well he stop taking but when he went to ER over weekend BS was 291. Pt is schedule for his CPX next wed (07/01/09) with dr. Everardo All. Should pt be taking something for his increase BS until he see md. [email or web address] Follow-up by: Orlan Leavens,  June 22, 2009 11:43 AM  Additional Follow-up for Phone Call Additional follow up Details #1::        ok to resume metformin xr 500mg  two times a day until he sees SAE - ex done Additional Follow-up by: Newt Lukes MD,  June 22, 2009 12:23 PM    Additional Follow-up for Phone Call Additional follow up Details #2::    Notified pt with md response. rx already [facility] to [facility] Follow-up by: Orlan Leavens,  June 22, 2009 3:45 PM  New/Updated Medications: METFORMIN HCL 500 MG XR24H-TAB (METFORMIN HCL) 1 by mouth two times a day Prescriptions: METFORMIN HCL 500 MG XR24H-TAB (METFORMIN HCL) 1 by mouth two times a day  #60 x 0   Entered and Authorized by:   Newt Lukes MD   Signed by:   Newt Lukes MD on 06/22/2009   Method used:   Electronically to        [facility] [address] (retail)       [address]       [place], [place]  [address]       Ph: [phone] or [phone]       Fax: [phone]   RxID:    [id number]

## 2010-02-09 NOTE — Assessment & Plan Note (Signed)
Summary: 3 MTH FU  STC   Vital Signs:  Patient profile:   65 year old male Height:      68 inches (172.72 cm) Weight:      234.50 pounds (106.59 kg) BMI:     35.78 O2 Sat:      97 % on Room air Temp:     98.1 degrees F (36.72 degrees C) oral Pulse rate:   100 / minute BP sitting:   142 / 82  (left arm) Cuff size:   large  Vitals Entered By: Brenton Grills MA (September 30, 2009 10:35 AM)  O2 Flow:  Room air CC: 3 month F/U/aj Is Patient Diabetic? Yes   CC:  3 month F/U/aj.  History of Present Illness: the status of at least 3 ongoing medical problems is addressed today: dm:  he has lost weight, due to his efforts. gout:  no recent sxs htn:  he takes meds as rx'ed.  denies sob.  he wants cheaper form of metoprolol.   Current Medications (verified): 1)  Adult Aspirin Low Strength 81 Mg  Tbdp (Aspirin) .... Take 1 By Mouth Qd 2)  Allopurinol 300 Mg  Tabs (Allopurinol) .... Take 1 By Mouth Once Daily 3)  Metoprolol Succinate 100 Mg  Xr24h-Tab (Metoprolol Succinate) .... Take 1 By Mouth Qd 4)  Simvastatin 40 Mg Tabs (Simvastatin) .Marland Kitchen.. 1 Qd 5)  Ondansetron 4 Mg Tbdp (Ondansetron) .Marland Kitchen.. 1 Q4h As Needed Nausea 6)  Onetouch Ultrasoft Lancets  Misc (Lancets) .... Use Qd As Directed 7)  Onetouch Ultra Test  Strp (Glucose Blood) .... Use Test Strips Qd As Directed 8)  Fenofibrate 54 Mg Tabs (Fenofibrate) .Marland Kitchen.. 1 Qd 9)  Amlodipine Besylate 2.5 Mg Tabs (Amlodipine Besylate) .Marland Kitchen.. 1 Once Daily 10)  Metformin Hcl 500 Mg Xr24h-Tab (Metformin Hcl) .Marland Kitchen.. 1 By Mouth Two Times A Day 11)  Levothyroxine Sodium 150 Mcg Tabs (Levothyroxine Sodium) .Marland Kitchen.. 1 Once Daily 12)  Losartan Potassium-Hctz 100-25 Mg Tabs (Losartan Potassium-Hctz) .Marland Kitchen.. 1 Once Daily  Allergies (verified): 1)  ! Penicillin 2)  ! Tetracycline 3)  ! 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         denies myalgias  Physical Exam  General:  obese.  no distress  Extremities:  no edema Additional Exam:  FastTSH              [L]  0.34 uIU/mL                 0.35-5.50 Uric Acid            [L]  3.1 mg/dL                   1.6-1.0 Hemoglobin A1C       [H]  6.6 %        Impression & Recommendations:  Problem # 1:  DM (ICD-250.00) well-controlled  Problem # 2:  HYPERTENSION (ICD-401.9) needs increased rx  Problem # 3:  GOUT, UNSPECIFIED (ICD-274.9) well-controlled  Problem # 4:  HYPOTHYROIDISM (ICD-244.9) minimally overreplaced  Medications Added to Medication List This Visit: 1)  Metoprolol Tartrate 50 Mg Tabs (Metoprolol tartrate) .Marland Kitchen.. 1 tab two times a day 2)  Lumigan 0.01 % Soln (Bimatoprost) .Marland Kitchen.. 1 drop right eye at bedtime 3)  Amlodipine Besylate 5 Mg Tabs (Amlodipine besylate) .Marland Kitchen.. 1 tab  once daily  Other Orders: TLB-TSH (Thyroid Stimulating Hormone) (84443-TSH) TLB-Uric Acid, Blood (84550-URIC) TLB-A1C / Hgb A1C (Glycohemoglobin) (83036-A1C) Est. Patient Level IV ([address])  Patient Instructions: 1)  blood tests are being ordered for you today.  please call [phone] to hear your test results. 2)  Please schedule a follow-up appointment in 4 months. 3)  change metoprolol to immediate-release 50 mg two times a day 4)  increase amlodipine to 5 mg once daily. 5)  (update: i left message on phone-tree:  rx as we discussed) Prescriptions: AMLODIPINE BESYLATE 5 MG TABS (AMLODIPINE BESYLATE) 1 tab once daily  #90 x 3   Entered and Authorized by:   Minus Breeding MD   Signed by:   Minus Breeding MD on 09/30/2009   Method used:   Electronically to        [facility] [address] (retail)       [address]       [place], [place]  [address]       Ph: [phone] or [phone]       Fax: [phone]   RxID:   [id number] METOPROLOL TARTRATE 50 MG TABS  (METOPROLOL TARTRATE) 1 tab two times a day  #180 x 3   Entered and Authorized by:   Minus Breeding MD   Signed by:   Minus Breeding MD on 09/30/2009   Method used:   Electronically to        [facility] [address] (retail)       [address]       [place], [place]  [address]       Ph: [phone] or [phone]       Fax: [phone]   RxID:   [id number]

## 2010-02-10 ENCOUNTER — Ambulatory Visit: Admit: 2010-02-10 | Payer: Self-pay | Admitting: Endocrinology

## 2010-02-10 ENCOUNTER — Ambulatory Visit: Payer: Self-pay | Admitting: Endocrinology

## 2010-02-26 ENCOUNTER — Ambulatory Visit: Payer: Self-pay | Admitting: Endocrinology

## 2010-03-04 ENCOUNTER — Other Ambulatory Visit: Payer: Self-pay | Admitting: Endocrinology

## 2010-03-04 ENCOUNTER — Other Ambulatory Visit: Payer: PRIVATE HEALTH INSURANCE

## 2010-03-04 ENCOUNTER — Ambulatory Visit (INDEPENDENT_AMBULATORY_CARE_PROVIDER_SITE_OTHER): Payer: PRIVATE HEALTH INSURANCE | Admitting: Endocrinology

## 2010-03-04 ENCOUNTER — Encounter: Payer: Self-pay | Admitting: Endocrinology

## 2010-03-04 DIAGNOSIS — E119 Type 2 diabetes mellitus without complications: Secondary | ICD-10-CM

## 2010-03-04 LAB — HEMOGLOBIN A1C: Hgb A1c MFr Bld: 7.3 % — ABNORMAL HIGH (ref 4.6–6.5)

## 2010-03-09 NOTE — Assessment & Plan Note (Signed)
Summary: 4 MTH FU STC--- PT RS'D/CD   Vital Signs:  Patient profile:   65 year old male Height:      68 inches (172.72 cm) Weight:      239.38 pounds (108.81 kg) BMI:     36.53 O2 Sat:      94 % on Room air Temp:     98.2 degrees F (36.78 degrees C) oral Pulse rate:   91 / minute Pulse rhythm:   regular BP sitting:   148 / 88  (left arm) Cuff size:   large  Vitals Entered By: Brenton Grills CMA Duncan Dull) (March 04, 2010 9:34 AM)  O2 Flow:  Room air CC: 4 month F/U/aj Is Patient Diabetic? Yes Comments Pt declined flu shot   CC:  4 month F/U/aj.  History of Present Illness: the status of at least 3 ongoing medical problems is addressed today: dyslipidemia: pt says he continues to struggle with his weight.  no chest pain. dm:  no cbg record, but states cbg's are low to mid-100's.  no hypoglycemic sxs. htn:  no sob.  there is an inteaction berween zocor and norvasc. hypothyroid:  he takes synthroid as rx'ed.  denies diarrhea.  Current Medications (verified): 1)  Adult Aspirin Low Strength 81 Mg  Tbdp (Aspirin) .... Take 1 By Mouth Qd 2)  Allopurinol 300 Mg  Tabs (Allopurinol) .... Take 1 By Mouth Once Daily 3)  Simvastatin 40 Mg Tabs (Simvastatin) .Marland Kitchen.. 1 Qd 4)  Onetouch Ultrasoft Lancets  Misc (Lancets) .... Use Qd As Directed 5)  Onetouch Ultra Test  Strp (Glucose Blood) .... Use Test Strips Qd As Directed 6)  Fenofibrate 54 Mg Tabs (Fenofibrate) .Marland Kitchen.. 1 Qd 7)  Metformin Hcl 500 Mg Xr24h-Tab (Metformin Hcl) .Marland Kitchen.. 1 By Mouth Two Times A Day 8)  Levothyroxine Sodium 150 Mcg Tabs (Levothyroxine Sodium) .Marland Kitchen.. 1 Once Daily 9)  Losartan Potassium-Hctz 100-25 Mg Tabs (Losartan Potassium-Hctz) .Marland Kitchen.. 1 Once Daily 10)  Metoprolol Tartrate 50 Mg Tabs (Metoprolol Tartrate) .Marland Kitchen.. 1 Tab Two Times A Day 11)  Lumigan 0.01 % Soln (Bimatoprost) .Marland Kitchen.. 1 Drop Right Eye At Bedtime 12)  Amlodipine Besylate 5 Mg Tabs (Amlodipine Besylate) .Marland Kitchen.. 1 Tab Once Daily  Allergies (verified): 1)  !  Penicillin 2)  ! Tetracycline 3)  ! 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    Physical Exam  General:  obese.  no distress  Lungs:  Clear to auscultation bilaterally. Normal respiratory effort.  Heart:  Regular rate and rhythm without murmurs or gallops noted. Normal S1,S2.   Pulses:  dorsalis pedis intact bilat.   Extremities:  no deformity.  no ulcer on the feet.  feet are of normal color and temp.  no edema  Neurologic:  sensation is intact to touch on the feet Additional Exam:  Hemoglobin A1C       [H]  7.3 %    Impression & Recommendations:  Problem # 1:  DM (ICD-250.00) needs increased rx  Problem # 2:  HYPERCHOLESTEROLEMIA (ICD-272.0) LDL: 81   Problem # 3:  HYPOTHYROIDISM (ICD-244.9) overreplaced TSH: 0.34 (09/30/2009)     Problem # 4:  HYPERTENSION (ICD-401.9) needs increased rx  Medications Added to Medication List This Visit: 1)  Metformin Hcl  500 Mg Xr24h-tab (Metformin hcl) .... 2 tabs by mouth, two times a day 2)  Levothyroxine Sodium 125 Mcg Tabs (Levothyroxine sodium) .Marland Kitchen.. 1 tab once daily 3)  Amlodipine Besylate 10 Mg Tabs (Amlodipine besylate) .Marland Kitchen.. 1 tab once daily 4)  Simvastatin 20 Mg Tabs (Simvastatin) .Marland Kitchen.. 1 tab once daily  Other Orders: Surgical Referral (Surgery) TLB-A1C / Hgb A1C (Glycohemoglobin) (83036-A1C) Est. Patient Level IV ([address])  Patient Instructions: 1)  our office will call you about the next informational meeting for weight-loss surgery. 2)  blood tests are being ordered for you today.  please call [phone] to hear your test results. 3)  pending the test results, please increase metformin to 2 pills  two times a day. 4)  decrease levothyroxine to 125 micrograms/day. 5)  increase amlodipine to 10 mg once daily 6)  decrease simvastatin to 20 mg at bedtime 7)  Please schedule a regular physical appointment in 4 months. 8)  (update: i left message on phone-tree:  rx as we discussed) Prescriptions: SIMVASTATIN 20 MG TABS (SIMVASTATIN) 1 tab once daily  #30 x 11   Entered and Authorized by:   Minus Breeding MD   Signed by:   Minus Breeding MD on 03/04/2010   Method used:   Electronically to        [facility] [address] (retail)       [address]       [place], [place]  [address]       Ph: [phone] or [phone]       Fax: [phone]   RxID:   [id number] AMLODIPINE BESYLATE 10 MG TABS (AMLODIPINE BESYLATE) 1 tab once daily  #30 x 11   Entered and Authorized by:   Minus Breeding MD   Signed by:   Minus Breeding MD on 03/04/2010   Method used:   Electronically to        [facility] [address] (retail)       [address]       [place], [place]  [address]       Ph: [phone] or [phone]       Fax: [phone]   RxID:   [id number] LEVOTHYROXINE SODIUM 125 MCG TABS (LEVOTHYROXINE SODIUM) 1 tab once daily  #30 x 11   Entered and Authorized by:   Minus Breeding MD   Signed by:   Minus Breeding MD on 03/04/2010   Method used:   Electronically to        [facility] [address] (retail)       [address]       [place], [place]  [address]       Ph: [phone] or [phone]       Fax: [phone]   RxID:   [id number] METFORMIN HCL 500 MG XR24H-TAB (METFORMIN HCL) 2 tabs by mouth, two times a day  #120 x 11   Entered and Authorized by:   Minus Breeding MD   Signed by:   Minus Breeding MD on 03/04/2010   Method used:   Electronically to        [facility] [address] (retail)       [address]       [facility]  [place], [place]  [address]       Ph: [phone] or [phone]       Fax: [phone]   RxID:   [id number] ONETOUCH ULTRA TEST  STRP (GLUCOSE BLOOD) use test strips qd as directed  #100 x 11   Entered and Authorized by:   Minus Breeding MD   Signed by:   Minus Breeding MD on 03/04/2010   Method used:   Electronically to        [facility] [address] (retail)       [address]       [place], [place]  [address]       Ph: [phone] or [phone]       Fax: [phone]   RxID:   [id number]    Orders Added: 1)  Surgical Referral [Surgery] 2)  TLB-A1C / Hgb A1C (Glycohemoglobin) [83036-A1C] 3)  Est. Patient Level IV [[address]]

## 2010-03-29 LAB — URINALYSIS, ROUTINE W REFLEX MICROSCOPIC
Bilirubin Urine: NEGATIVE
Glucose, UA: >1000 mg/dL — AB
Hgb urine dipstick: NEGATIVE
Ketones, ur: NEGATIVE mg/dL
Leukocytes, UA: NEGATIVE
Nitrite: NEGATIVE
Protein, ur: 30 mg/dL — AB
Specific Gravity, Urine: 1.028 (ref 1.005–1.030)
Urobilinogen, UA: 0.2 mg/dL (ref 0.0–1.0)
pH: 5 (ref 5.0–8.0)

## 2010-03-29 LAB — GLUCOSE, CAPILLARY
Glucose-Capillary: 274 mg/dL — ABNORMAL HIGH (ref 70–99)
Glucose-Capillary: 291 mg/dL — ABNORMAL HIGH (ref 70–99)

## 2010-03-29 LAB — URINE MICROSCOPIC-ADD ON

## 2010-04-14 ENCOUNTER — Other Ambulatory Visit: Payer: Self-pay | Admitting: Endocrinology

## 2010-05-24 ENCOUNTER — Other Ambulatory Visit: Payer: Self-pay | Admitting: Endocrinology

## 2010-06-12 ENCOUNTER — Other Ambulatory Visit: Payer: Self-pay | Admitting: Endocrinology

## 2010-06-21 ENCOUNTER — Other Ambulatory Visit: Payer: Self-pay | Admitting: Endocrinology

## 2010-06-29 ENCOUNTER — Other Ambulatory Visit: Payer: Self-pay | Admitting: Endocrinology

## 2010-06-29 ENCOUNTER — Other Ambulatory Visit: Payer: PRIVATE HEALTH INSURANCE

## 2010-06-29 DIAGNOSIS — Z0389 Encounter for observation for other suspected diseases and conditions ruled out: Secondary | ICD-10-CM

## 2010-06-29 DIAGNOSIS — Z0000 Encounter for general adult medical examination without abnormal findings: Secondary | ICD-10-CM

## 2010-06-29 DIAGNOSIS — E119 Type 2 diabetes mellitus without complications: Secondary | ICD-10-CM

## 2010-07-06 ENCOUNTER — Encounter: Payer: PRIVATE HEALTH INSURANCE | Admitting: Endocrinology

## 2010-08-02 ENCOUNTER — Other Ambulatory Visit: Payer: PRIVATE HEALTH INSURANCE

## 2010-08-09 ENCOUNTER — Encounter: Payer: PRIVATE HEALTH INSURANCE | Admitting: Endocrinology

## 2010-08-11 ENCOUNTER — Other Ambulatory Visit: Payer: PRIVATE HEALTH INSURANCE

## 2010-08-19 ENCOUNTER — Encounter: Payer: PRIVATE HEALTH INSURANCE | Admitting: Endocrinology

## 2010-08-31 ENCOUNTER — Telehealth: Payer: Self-pay | Admitting: *Deleted

## 2010-08-31 DIAGNOSIS — Z0389 Encounter for observation for other suspected diseases and conditions ruled out: Secondary | ICD-10-CM

## 2010-08-31 DIAGNOSIS — Z0000 Encounter for general adult medical examination without abnormal findings: Secondary | ICD-10-CM

## 2010-08-31 DIAGNOSIS — E119 Type 2 diabetes mellitus without complications: Secondary | ICD-10-CM

## 2010-08-31 NOTE — Telephone Encounter (Signed)
CPX labs placed in Epic

## 2010-09-01 ENCOUNTER — Other Ambulatory Visit: Payer: Self-pay | Admitting: Endocrinology

## 2010-09-01 ENCOUNTER — Other Ambulatory Visit: Payer: PRIVATE HEALTH INSURANCE

## 2010-09-01 ENCOUNTER — Other Ambulatory Visit (INDEPENDENT_AMBULATORY_CARE_PROVIDER_SITE_OTHER): Payer: PRIVATE HEALTH INSURANCE

## 2010-09-01 DIAGNOSIS — Z0000 Encounter for general adult medical examination without abnormal findings: Secondary | ICD-10-CM

## 2010-09-01 DIAGNOSIS — E119 Type 2 diabetes mellitus without complications: Secondary | ICD-10-CM

## 2010-09-01 DIAGNOSIS — Z0389 Encounter for observation for other suspected diseases and conditions ruled out: Secondary | ICD-10-CM

## 2010-09-01 LAB — BASIC METABOLIC PANEL
Calcium: 9.5 mg/dL (ref 8.4–10.5)
GFR: 52.2 mL/min — ABNORMAL LOW (ref >60.00)
Potassium: 3.6 mEq/L (ref 3.5–5.1)
Sodium: 138 mEq/L (ref 135–145)

## 2010-09-01 LAB — CBC WITH DIFFERENTIAL/PLATELET
Eosinophils Absolute: 0.2 10*3/uL (ref 0.0–0.7)
Lymphs Abs: 3.8 10*3/uL (ref 0.7–4.0)
MCHC: 33.7 g/dL (ref 30.0–36.0)
MCV: 90.7 fl (ref 78.0–100.0)
Monocytes Absolute: 0.8 10*3/uL (ref 0.1–1.0)
Neutrophils Relative %: 58.9 % (ref 43.0–77.0)
Platelets: 269 10*3/uL (ref 150.0–400.0)
RDW: 14.3 % (ref 11.5–14.6)

## 2010-09-01 LAB — URINALYSIS, ROUTINE W REFLEX MICROSCOPIC
Leukocytes, UA: NEGATIVE
Nitrite: NEGATIVE
Specific Gravity, Urine: >=1.03 (ref 1.000–1.030)
Total Protein, Urine: NEGATIVE
pH: 5.5 (ref 5.0–8.0)

## 2010-09-01 LAB — HEPATIC FUNCTION PANEL
AST: 33 U/L (ref 0–37)
Alkaline Phosphatase: 49 U/L (ref 39–117)
Total Bilirubin: 0.7 mg/dL (ref 0.3–1.2)

## 2010-09-01 LAB — MICROALBUMIN / CREATININE URINE RATIO
Creatinine,U: 303.5 mg/dL
Microalb, Ur: 2.6 mg/dL — ABNORMAL HIGH (ref 0.0–1.9)

## 2010-09-01 LAB — TSH: TSH: 4.1 u[IU]/mL (ref 0.35–5.50)

## 2010-09-01 LAB — PSA: PSA: 0.61 ng/mL (ref 0.10–4.00)

## 2010-09-01 LAB — LIPID PANEL: Cholesterol: 167 mg/dL (ref 0–200)

## 2010-09-01 LAB — LDL CHOLESTEROL, DIRECT: Direct LDL: 89 mg/dL

## 2010-09-08 ENCOUNTER — Encounter: Payer: Self-pay | Admitting: Endocrinology

## 2010-09-08 ENCOUNTER — Ambulatory Visit (INDEPENDENT_AMBULATORY_CARE_PROVIDER_SITE_OTHER): Payer: PRIVATE HEALTH INSURANCE | Admitting: Endocrinology

## 2010-09-08 VITALS — BP 142/86 | HR 79 | Temp 98.3°F | Ht 69.0 in | Wt 240.4 lb

## 2010-09-08 DIAGNOSIS — I10 Essential (primary) hypertension: Secondary | ICD-10-CM

## 2010-09-08 DIAGNOSIS — Z0000 Encounter for general adult medical examination without abnormal findings: Secondary | ICD-10-CM

## 2010-09-08 DIAGNOSIS — Z23 Encounter for immunization: Secondary | ICD-10-CM

## 2010-09-08 DIAGNOSIS — E119 Type 2 diabetes mellitus without complications: Secondary | ICD-10-CM

## 2010-09-08 MED ORDER — SIMVASTATIN 10 MG PO TABS: 10.0000 mg | ORAL_TABLET | Freq: Every day | ORAL | Status: DC

## 2010-09-08 MED ORDER — LOSARTAN POTASSIUM-HCTZ 100-25 MG PO TABS: 1.0000 | ORAL_TABLET | Freq: Every day | ORAL | Status: DC

## 2010-09-08 MED ORDER — ONETOUCH ULTRASOFT LANCETS MISC: 1.0000 | Freq: Every day | Status: DC

## 2010-09-08 MED ORDER — ALLOPURINOL 300 MG PO TABS: 300.0000 mg | ORAL_TABLET | Freq: Every day | ORAL | Status: DC

## 2010-09-08 MED ORDER — AMLODIPINE BESYLATE 10 MG PO TABS: 10.0000 mg | ORAL_TABLET | Freq: Every day | ORAL | Status: DC

## 2010-09-08 MED ORDER — METOPROLOL TARTRATE 50 MG PO TABS: 50.0000 mg | ORAL_TABLET | Freq: Two times a day (BID) | ORAL | Status: DC

## 2010-09-08 NOTE — Progress Notes (Signed)
Subjective:    Patient ID: Paul Banks, male    DOB: Apr 10, 1945, 65 y.o.   MRN: [id number]  HPI here for regular wellness examination.  He's feeling pretty well in general, and says chronic med probs are stable, except as noted below Past Medical History  Diagnosis Date  . HYPOTHYROIDISM 08/17/2006  . DM 06/12/2008  . HYPERCHOLESTEROLEMIA 05/02/2007  . Gout, unspecified 07/01/2009  . ANEMIA 07/01/2009  . HYPERTENSION 08/17/2006  . FATTY LIVER DISEASE 06/12/2008  . COUGH DUE TO ACE INHIBITORS 09/10/2008  . NASH (nonalcoholic steatohepatitis)   . Mild renal insufficiency     w/u NEG    No past surgical history on file.  History   Social History  . Marital Status: Single    Spouse Name: N/A    Number of Children: N/A  . Years of Education: N/A   Occupational History  . Production manager     for [employer]   Social History Main Topics  . Smoking status: Not on file  . Smokeless tobacco: Not on file  . Alcohol Use:   . Drug Use:   . Sexually Active:    Other Topics Concern  . Not on file   Social History Narrative  . No narrative on file    Current Outpatient Prescriptions on File Prior to Visit  Medication Sig Dispense Refill  . allopurinol (ZYLOPRIM) 300 MG tablet TAKE 1 TABLET BY MOUTH DAILY  90 tablet  1  . amLODipine (NORVASC) 10 MG tablet Take 10 mg by mouth daily.        Marland Kitchen aspirin 81 MG tablet Take 81 mg by mouth daily.        . Bimatoprost (LUMIGAN) 0.01 % SOLN Apply to eye. 1 drop in right eye at bedtime       . fenofibrate 54 MG tablet TAKE ONE TABLET BY MOUTH DAILY  30 tablet  4  . glucose blood (ONE TOUCH ULTRA TEST) test strip Use test strips once daily as directed       . Lancets (ONETOUCH ULTRASOFT) lancets Use as instructed once daily       . levothyroxine (SYNTHROID, LEVOTHROID) 125 MCG tablet Take 125 mcg by mouth daily.        Marland Kitchen losartan-hydrochlorothiazide (HYZAAR) 100-25 MG per tablet TAKE ONE TABLET BY MOUTH DAILY  30 tablet  2  . metFORMIN  (GLUCOPHAGE-XR) 500 MG 24 hr tablet 2 tablets by mouth two times a day       . metoprolol (LOPRESSOR) 50 MG tablet Take 50 mg by mouth 2 (two) times daily.        . simvastatin (ZOCOR) 20 MG tablet Take 20 mg by mouth daily.          Allergies  Allergen Reactions  . Penicillins     REACTION: Rash  . Pioglitazone     REACTION: edema  . Tetracycline     REACTION: Rash    Family History  Problem Relation Age of Onset  . Cancer Mother     Breast Cancer  . Cancer Sister     Breast Cancer    There were no vitals taken for this visit.     Review of Systems  Constitutional: Negative for fever.  HENT: Negative for hearing loss.   Eyes: Negative for visual disturbance.  Respiratory: Negative for shortness of breath.   Cardiovascular: Negative for chest pain.  Gastrointestinal: Negative for anal bleeding.  Genitourinary: Negative for hematuria.  Musculoskeletal: Negative for arthralgias.  Skin: Negative for rash.  Neurological: Negative for syncope.  Hematological: Does not bruise/bleed easily.  Psychiatric/Behavioral: Negative for dysphoric mood.       Objective:   Physical Exam VS: see vs page. GEN: no distress HEAD: head: no deformity. eyes: no periorbital swelling, no proptosis external nose and ears are normal mouth: no lesion seen NECK: supple, thyroid is not enlarged CHEST WALL: no deformity LUNGS: clear to auscultation BREASTS:  No gynecomastia CV: reg rate and rhythm, no murmur ABD: abdomen is soft, nontender.  no hepatosplenomegaly.  not distended.  no hernia RECTAL: normal external and internal exam.  heme neg. PROSTATE:  Normal size.  No nodule MUSCULOSKELETAL: muscle bulk and strength are grossly normal.  no obvious joint swelling.  gait is normal and steady PULSES: no carotid bruit NEURO:  cn 2-12 grossly intact.   readily moves all 4's.   SKIN:  Normal texture and temperature.  No rash or suspicious lesion is visible.   NODES:  None palpable at the  neck PSYCH: alert, oriented x3.  Does not appear anxious nor depressed.       Assessment & Plan:  Wellness visit today, with problems stable, except as noted.   SEPARATE EVALUATION FOLLOWS--EACH PROBLEM HERE IS NEW, NOT RESPONDING TO TREATMENT, OR POSES SIGNIFICANT RISK TO THE PATIENT'S HEALTH: HISTORY OF THE PRESENT ILLNESS: Pt says he takes norvasc only 5 mg.  He says januvia 100 mg caused hypoglycemia in the past. PAST MEDICAL HISTORY reviewed and up to date today REVIEW OF SYSTEMS: Denies weight change and headache PHYSICAL EXAMINATION: VS: see vital signs page Pulses: dorsalis pedis intact bilat.   Feet: no deformity.  no ulcer on the feet.  feet are of normal color and temp.  no edema Neuro: sensation is intact to touch on the feet. LAB/XRAY RESULTS: Lab Results  Component Value Date   CHOL 167 09/01/2010   CHOL 160 06/29/2009   CHOL 171 12/29/2008   Lab Results  Component Value Date   HDL 35.70* 09/01/2010   HDL 37.80* 06/29/2009   HDL 38.80* 12/29/2008   Lab Results  Component Value Date   LDLCALC 81 09/08/2008   LDLCALC 60 04/27/2007   LDLCALC 50 10/23/2006   Lab Results  Component Value Date   TRIG 342.0* 09/01/2010   TRIG 291.0* 06/29/2009   TRIG 221.0* 12/29/2008   Lab Results  Component Value Date   CHOLHDL 5 09/01/2010   CHOLHDL 4 06/29/2009   CHOLHDL 4 12/29/2008   Lab Results  Component Value Date   LDLDIRECT 89.0 09/01/2010   LDLDIRECT 89.2 06/29/2009   LDLDIRECT 93.3 12/29/2008    Lab Results  Component Value Date   HGBA1C 7.0* 09/01/2010  IMPRESSION: Dyslipidemia.  There is an interaction between norvasc and zocor Dm, Needs increased rx, if it can be done with a regimen that avoids or minimizes hypoglycemia. Htn, needs increased rx PLAN: See instruction page

## 2010-09-08 NOTE — Patient Instructions (Addendum)
please consider these measures for your health:  minimize alcohol.  do not use tobacco products.  have a colonoscopy at least every 10 years from age 63.  Women should have an annual mammogram from age 2.  keep firearms safely stored.  always use seat belts.  have working smoke alarms in your home.  see an eye doctor and dentist regularly.  never drive under the influence of alcohol or drugs (including prescription drugs).  those with fair skin should take precautions against the sun. please let me know what your wishes would be, if artificial life support measures should become necessary.  it is critically important to prevent falling down (keep floor areas well-lit, dry, and free of loose objects) Please make a follow-up appointment in 6 months. Increase amlodipine to 10 mg daily Decrease simvastatin to 10 mg daily. Add januvia 50 mg daily.

## 2010-09-13 DIAGNOSIS — Z0000 Encounter for general adult medical examination without abnormal findings: Secondary | ICD-10-CM | POA: Insufficient documentation

## 2010-10-16 ENCOUNTER — Other Ambulatory Visit: Payer: Self-pay | Admitting: Endocrinology

## 2010-12-29 ENCOUNTER — Other Ambulatory Visit: Payer: Self-pay | Admitting: Endocrinology

## 2011-03-09 ENCOUNTER — Ambulatory Visit: Payer: PRIVATE HEALTH INSURANCE | Admitting: Endocrinology

## 2011-03-09 ENCOUNTER — Other Ambulatory Visit: Payer: Self-pay | Admitting: Endocrinology

## 2011-03-30 ENCOUNTER — Ambulatory Visit: Payer: PRIVATE HEALTH INSURANCE | Admitting: Endocrinology

## 2011-04-20 ENCOUNTER — Ambulatory Visit: Payer: PRIVATE HEALTH INSURANCE | Admitting: Endocrinology

## 2011-05-04 ENCOUNTER — Ambulatory Visit: Payer: PRIVATE HEALTH INSURANCE | Admitting: Endocrinology

## 2011-05-24 ENCOUNTER — Ambulatory Visit: Payer: PRIVATE HEALTH INSURANCE | Admitting: Endocrinology

## 2011-06-01 ENCOUNTER — Ambulatory Visit: Payer: PRIVATE HEALTH INSURANCE | Admitting: Endocrinology

## 2011-06-22 ENCOUNTER — Ambulatory Visit (INDEPENDENT_AMBULATORY_CARE_PROVIDER_SITE_OTHER): Payer: Self-pay | Admitting: Endocrinology

## 2011-06-22 ENCOUNTER — Encounter: Payer: Self-pay | Admitting: Endocrinology

## 2011-06-22 ENCOUNTER — Other Ambulatory Visit (INDEPENDENT_AMBULATORY_CARE_PROVIDER_SITE_OTHER): Payer: Self-pay

## 2011-06-22 VITALS — BP 132/82 | HR 89 | Temp 98.3°F | Ht 69.0 in | Wt 241.0 lb

## 2011-06-22 DIAGNOSIS — Z0000 Encounter for general adult medical examination without abnormal findings: Secondary | ICD-10-CM

## 2011-06-22 DIAGNOSIS — E119 Type 2 diabetes mellitus without complications: Secondary | ICD-10-CM

## 2011-06-22 DIAGNOSIS — Z0389 Encounter for observation for other suspected diseases and conditions ruled out: Secondary | ICD-10-CM

## 2011-06-22 LAB — CBC WITH DIFFERENTIAL/PLATELET
Basophils Relative: 0.9 % (ref 0.0–3.0)
Eosinophils Absolute: 0.1 10*3/uL (ref 0.0–0.7)
Eosinophils Relative: 1.1 % (ref 0.0–5.0)
HCT: 48.5 % (ref 39.0–52.0)
Hemoglobin: 16.4 g/dL (ref 13.0–17.0)
Lymphs Abs: 3.9 10*3/uL (ref 0.7–4.0)
MCHC: 33.7 g/dL (ref 30.0–36.0)
MCV: 88.9 fl (ref 78.0–100.0)
Monocytes Absolute: 0.8 10*3/uL (ref 0.1–1.0)
Neutro Abs: 7.4 10*3/uL (ref 1.4–7.7)
RBC: 5.46 Mil/uL (ref 4.22–5.81)
WBC: 12.3 10*3/uL — ABNORMAL HIGH (ref 4.5–10.5)

## 2011-06-22 LAB — HEMOGLOBIN A1C: Hgb A1c MFr Bld: 8.1 % — ABNORMAL HIGH (ref 4.6–6.5)

## 2011-06-22 LAB — LIPID PANEL
Cholesterol: 216 mg/dL — ABNORMAL HIGH (ref 0–200)
Total CHOL/HDL Ratio: 5
VLDL: 93.8 mg/dL — ABNORMAL HIGH (ref 0.0–40.0)

## 2011-06-22 LAB — BASIC METABOLIC PANEL
BUN: 16 mg/dL (ref 6–23)
Creatinine, Ser: 1.7 mg/dL — ABNORMAL HIGH (ref 0.4–1.5)
GFR: 44.85 mL/min — ABNORMAL LOW (ref >60.00)
Glucose, Bld: 178 mg/dL — ABNORMAL HIGH (ref 70–99)
Potassium: 4.1 mEq/L (ref 3.5–5.1)

## 2011-06-22 LAB — URINALYSIS
Ketones, ur: NEGATIVE
Leukocytes, UA: NEGATIVE
Specific Gravity, Urine: 1.02 (ref 1.000–1.030)
Urine Glucose: NEGATIVE
Urobilinogen, UA: 1 (ref 0.0–1.0)
pH: 6.5 (ref 5.0–8.0)

## 2011-06-22 LAB — HEPATIC FUNCTION PANEL
Alkaline Phosphatase: 59 U/L (ref 39–117)
Bilirubin, Direct: 0.1 mg/dL (ref 0.0–0.3)
Total Bilirubin: 0.5 mg/dL (ref 0.3–1.2)

## 2011-06-22 LAB — LDL CHOLESTEROL, DIRECT: Direct LDL: 108.7 mg/dL

## 2011-06-22 LAB — MICROALBUMIN / CREATININE URINE RATIO: Microalb Creat Ratio: 2.9 mg/g (ref 0.0–30.0)

## 2011-06-22 MED ORDER — HYDRALAZINE HCL 10 MG PO TABS: 10.0000 mg | ORAL_TABLET | Freq: Three times a day (TID) | ORAL | Status: DC

## 2011-06-22 NOTE — Progress Notes (Signed)
Subjective:    Patient ID: Paul Banks, male    DOB: March 29, 1945, 66 y.o.   MRN: [id number]  HPI He wants to do the minimum until he gets medicare in 11 mos Pt returns for f/u of type 2 DM (2008;  No known complications). He stopped [place] due to swelling.   Past Medical History  Diagnosis Date  . HYPOTHYROIDISM 08/17/2006  . DM 06/12/2008  . HYPERCHOLESTEROLEMIA 05/02/2007  . Gout, unspecified 07/01/2009  . ANEMIA 07/01/2009  . HYPERTENSION 08/17/2006  . FATTY LIVER DISEASE 06/12/2008  . COUGH DUE TO ACE INHIBITORS 09/10/2008  . NASH (nonalcoholic steatohepatitis)   . Mild renal insufficiency     w/u NEG    No past surgical history on file.  History   Social History  . Marital Status: Single    Spouse Name: N/A    Number of Children: N/A  . Years of Education: N/A   Occupational History  . Production manager     for [employer]   Social History Main Topics  . Smoking status: Never Smoker   . Smokeless tobacco: Not on file  . Alcohol Use: Not on file  . Drug Use: Not on file  . Sexually Active: Not on file   Other Topics Concern  . Not on file   Social History Narrative  . No narrative on file    Current Outpatient Prescriptions on File Prior to Visit  Medication Sig Dispense Refill  . allopurinol (ZYLOPRIM) 300 MG tablet Take 1 tablet (300 mg total) by mouth daily.  90 tablet  3  . aspirin 81 MG tablet Take 81 mg by mouth daily.        . Bimatoprost (LUMIGAN) 0.01 % SOLN Apply to eye. 1 drop in right eye at bedtime       . fenofibrate 54 MG tablet TAKE ONE TABLET BY MOUTH DAILY  30 tablet  9  . glucose blood (ONE TOUCH ULTRA TEST) test strip Use test strips once daily as directed       . Lancets (ONETOUCH ULTRASOFT) lancets 1 each by Other route daily. Use as instructed once daily  100 each  3  . levothyroxine (SYNTHROID, LEVOTHROID) 125 MCG tablet TAKE ONE TABLET BY MOUTH DAILY  30 tablet  8  . losartan-hydrochlorothiazide (HYZAAR) 100-25 MG per tablet Take 1 tablet by  mouth daily.  90 tablet  3  . metFORMIN (GLUCOPHAGE-XR) 500 MG 24 hr tablet       . metoprolol (LOPRESSOR) 50 MG tablet Take 1 tablet (50 mg total) by mouth 2 (two) times daily.  180 tablet  3  . saxagliptin HCl (ONGLYZA) 5 MG TABS tablet Take 5 mg by mouth daily.      . simvastatin (ZOCOR) 10 MG tablet Take 1 tablet (10 mg total) by mouth at bedtime.  90 tablet  3  . bromocriptine (PARLODEL) 2.5 MG tablet Take 1 tablet (2.5 mg total) by mouth daily.  30 tablet  11  . hydrALAZINE (APRESOLINE) 10 MG tablet Take 1 tablet (10 mg total) by mouth 3 (three) times daily.  90 tablet  11    Allergies  Allergen Reactions  . Penicillins     REACTION: Rash  . Pioglitazone     REACTION: edema  . Tetracycline     REACTION: Rash    Family History  Problem Relation Age of Onset  . Cancer Mother     Breast Cancer  . Cancer Sister     Breast Cancer  BP 132/82  Pulse 89  Temp 98.3 F (36.8 C) (Oral)  Ht 5\' 9"  (1.753 m)  Wt 241 lb (109.317 kg)  BMI 35.59 kg/m2  SpO2 97%  Review of Systems Denies sob    Objective:   Physical Exam VITAL SIGNS:  See vs page GENERAL: no distress Pulses: dorsalis pedis intact bilat.   Feet: no deformity.  no ulcer on the feet.  feet are of normal color and temp.  1+ bilat leg edema. Neuro: sensation is intact to touch on the feet.  Lab Results  Component Value Date   HGBA1C 8.1* 06/22/2011      Assessment & Plan:  DM.  needs increased rx Edema, prob due to norvasc

## 2011-06-22 NOTE — Patient Instructions (Addendum)
blood tests are being requested for you today.  You will receive a letter with results. Please make a follow-up appointment in 6 months. decrease amlodipine to 5 mg daily. Change januvia to onglyza 5 mg daily.   i have sent a prescription to your pharmacy, for an additional blood pressure medication.

## 2011-06-23 ENCOUNTER — Telehealth: Payer: Self-pay | Admitting: *Deleted

## 2011-06-23 MED ORDER — BROMOCRIPTINE MESYLATE 2.5 MG PO TABS: 2.5000 mg | ORAL_TABLET | Freq: Every day | ORAL | Status: DC

## 2011-06-23 NOTE — Telephone Encounter (Signed)
sent 

## 2011-06-23 NOTE — Telephone Encounter (Signed)
Called pt to inform of lab results, pt informed (letter also mailed to pt). Pt needs rx for Bromocriptine sent to [facility].

## 2011-06-23 NOTE — Telephone Encounter (Signed)
Pt informed rx sent.

## 2011-06-29 ENCOUNTER — Other Ambulatory Visit: Payer: Self-pay | Admitting: *Deleted

## 2011-06-29 MED ORDER — AMLODIPINE BESYLATE 5 MG PO TABS: 5.0000 mg | ORAL_TABLET | Freq: Every day | ORAL | Status: DC

## 2011-06-29 NOTE — Telephone Encounter (Signed)
R'cd fax from [facility] for updated rx on Amlodipine dosage. Rx was changed to 5mg  daily at pt's last OV.

## 2011-09-21 ENCOUNTER — Ambulatory Visit: Payer: Self-pay | Admitting: Endocrinology

## 2011-09-25 ENCOUNTER — Other Ambulatory Visit: Payer: Self-pay | Admitting: Endocrinology

## 2011-10-05 ENCOUNTER — Ambulatory Visit: Payer: Self-pay | Admitting: Endocrinology

## 2011-10-16 ENCOUNTER — Other Ambulatory Visit: Payer: Self-pay | Admitting: Endocrinology

## 2011-10-17 ENCOUNTER — Ambulatory Visit: Payer: Self-pay | Admitting: Endocrinology

## 2011-10-17 NOTE — Telephone Encounter (Signed)
Refill request for Allopurinol and Lopressor. Last filled on 09/08/10 pt last seen on 06/22/11.

## 2011-10-26 ENCOUNTER — Ambulatory Visit: Payer: Self-pay | Admitting: Endocrinology

## 2011-11-02 ENCOUNTER — Encounter: Payer: Self-pay | Admitting: Endocrinology

## 2011-11-02 ENCOUNTER — Ambulatory Visit (INDEPENDENT_AMBULATORY_CARE_PROVIDER_SITE_OTHER): Payer: Self-pay | Admitting: Endocrinology

## 2011-11-02 VITALS — BP 134/86 | HR 74 | Temp 98.0°F | Resp 16 | Wt 237.1 lb

## 2011-11-02 DIAGNOSIS — E119 Type 2 diabetes mellitus without complications: Secondary | ICD-10-CM

## 2011-11-02 LAB — HEMOGLOBIN A1C: Mean Plasma Glucose: 214 mg/dL — ABNORMAL HIGH (ref <117)

## 2011-11-02 NOTE — Patient Instructions (Addendum)
blood tests are being requested for you today.  You will be contacted with results. Please come back for a follow-up appointment in 4 months. check your blood sugar once a day.  vary the time of day when you check, between before the 3 meals, and at bedtime.  also check if you have symptoms of your blood sugar being too high or too low.  please keep a record of the readings and bring it to your next appointment here.  please call us sooner if your blood sugar goes below 70, or if you have a lot of readings over 200.

## 2011-11-02 NOTE — Progress Notes (Signed)
Subjective:    Patient ID: Paul Banks, male    DOB: Sep 02, 1945, 66 y.o.   MRN: [id number]  HPI He wants to do the minimum until he gets medicare in 11 mos Pt returns for f/u of type 2 DM (dx'ed 2008; no known complications; he did not tolerate [place] due to leg swelling; he did not tolerate parlodel due to dizziness).  no cbg record, but states cbg's are well-controlled.   Past Medical History  Diagnosis Date  . HYPOTHYROIDISM 08/17/2006  . DM 06/12/2008  . HYPERCHOLESTEROLEMIA 05/02/2007  . Gout, unspecified 07/01/2009  . ANEMIA 07/01/2009  . HYPERTENSION 08/17/2006  . FATTY LIVER DISEASE 06/12/2008  . COUGH DUE TO ACE INHIBITORS 09/10/2008  . NASH (nonalcoholic steatohepatitis)   . Mild renal insufficiency     w/u NEG    No past surgical history on file.  History   Social History  . Marital Status: Single    Spouse Name: N/A    Number of Children: N/A  . Years of Education: N/A   Occupational History  . Production manager     for [employer]   Social History Main Topics  . Smoking status: Never Smoker   . Smokeless tobacco: Not on file  . Alcohol Use: Not on file  . Drug Use: Not on file  . Sexually Active: Not on file   Other Topics Concern  . Not on file   Social History Narrative  . No narrative on file    Current Outpatient Prescriptions on File Prior to Visit  Medication Sig Dispense Refill  . allopurinol (ZYLOPRIM) 300 MG tablet TAKE 1 TABLET (300 MG TOTAL) BY MOUTH DAILY.  90 tablet  2  . amLODipine (NORVASC) 5 MG tablet Take 1 tablet (5 mg total) by mouth daily.  30 tablet  11  . aspirin 81 MG tablet Take 81 mg by mouth daily.        . Bimatoprost (LUMIGAN) 0.01 % SOLN Apply to eye. 1 drop in right eye at bedtime       . fenofibrate 54 MG tablet TAKE ONE TABLET BY MOUTH DAILY  30 tablet  4  . glucose blood (ONE TOUCH ULTRA TEST) test strip Use test strips once daily as directed       . Lancets (ONETOUCH ULTRASOFT) lancets 1 each by Other route daily. Use as  instructed once daily  100 each  3  . levothyroxine (SYNTHROID, LEVOTHROID) 125 MCG tablet TAKE ONE TABLET BY MOUTH DAILY  30 tablet  8  . losartan-hydrochlorothiazide (HYZAAR) 100-25 MG per tablet TAKE 1 TABLET BY MOUTH DAILY.  30 tablet  4  . metFORMIN (GLUCOPHAGE-XR) 500 MG 24 hr tablet       . metoprolol (LOPRESSOR) 50 MG tablet TAKE 1 TABLET (50 MG TOTAL) BY MOUTH TWICE  DAILY  180 tablet  2  . saxagliptin HCl (ONGLYZA) 5 MG TABS tablet Take 5 mg by mouth daily.      Marland Kitchen glimepiride (AMARYL) 4 MG tablet Take 1 tablet (4 mg total) by mouth daily before breakfast.  30 tablet  6  . hydrALAZINE (APRESOLINE) 10 MG tablet Take 1 tablet (10 mg total) by mouth 3 (three) times daily.  90 tablet  11  . simvastatin (ZOCOR) 10 MG tablet Take 1 tablet (10 mg total) by mouth at bedtime.  90 tablet  3    Allergies  Allergen Reactions  . Penicillins     REACTION: Rash  . Pioglitazone  REACTION: edema  . Tetracycline     REACTION: Rash    Family History  Problem Relation Age of Onset  . Cancer Mother     Breast Cancer  . Cancer Sister     Breast Cancer    BP 134/86  Pulse 74  Temp 98 F (36.7 C) (Oral)  Resp 16  Wt 237 lb 2 oz (107.559 kg)  SpO2 99%   Review of Systems Denies weight change    Objective:   Physical Exam VITAL SIGNS:  See vs page GENERAL: no distress Ext: no edema      Assessment & Plan:  DM, needs increased rx.  therapy limited by pt's request for least expensive meds

## 2011-11-03 MED ORDER — GLIMEPIRIDE 4 MG PO TABS: 4.0000 mg | ORAL_TABLET | Freq: Every day | ORAL | Status: DC

## 2011-11-10 ENCOUNTER — Other Ambulatory Visit: Payer: Self-pay | Admitting: Endocrinology

## 2011-11-23 ENCOUNTER — Other Ambulatory Visit: Payer: Self-pay | Admitting: Endocrinology

## 2011-12-21 ENCOUNTER — Other Ambulatory Visit: Payer: Self-pay | Admitting: Endocrinology

## 2011-12-21 ENCOUNTER — Ambulatory Visit: Payer: Self-pay | Admitting: Endocrinology

## 2012-02-24 ENCOUNTER — Other Ambulatory Visit: Payer: Self-pay | Admitting: Endocrinology

## 2012-02-25 ENCOUNTER — Other Ambulatory Visit: Payer: Self-pay

## 2012-03-07 ENCOUNTER — Ambulatory Visit: Payer: Self-pay | Admitting: Endocrinology

## 2012-03-21 ENCOUNTER — Ambulatory Visit: Payer: Self-pay | Admitting: Endocrinology

## 2012-04-04 ENCOUNTER — Ambulatory Visit: Payer: Self-pay | Admitting: Endocrinology

## 2012-04-11 ENCOUNTER — Encounter: Payer: Self-pay | Admitting: Endocrinology

## 2012-04-11 ENCOUNTER — Ambulatory Visit (INDEPENDENT_AMBULATORY_CARE_PROVIDER_SITE_OTHER): Payer: Self-pay | Admitting: Endocrinology

## 2012-04-11 VITALS — BP 126/74 | HR 80 | Wt 242.0 lb

## 2012-04-11 DIAGNOSIS — E119 Type 2 diabetes mellitus without complications: Secondary | ICD-10-CM

## 2012-04-11 NOTE — Progress Notes (Signed)
Subjective:    Patient ID: Paul Banks, male    DOB: May 12, 1945, 67 y.o.   MRN: [id number]  HPI He wants to do the minimum until he gets medicare in 11 mos Pt returns for f/u of type 2 DM (dx'ed 2008; no known complications; he did not tolerate [place] due to leg swelling; he did not tolerate parlodel due to dizziness).  no cbg record, but states cbg's are well-controlled.  Past Medical History  Diagnosis Date  . HYPOTHYROIDISM 08/17/2006  . DM 06/12/2008  . HYPERCHOLESTEROLEMIA 05/02/2007  . Gout, unspecified 07/01/2009  . ANEMIA 07/01/2009  . HYPERTENSION 08/17/2006  . FATTY LIVER DISEASE 06/12/2008  . COUGH DUE TO ACE INHIBITORS 09/10/2008  . NASH (nonalcoholic steatohepatitis)   . Mild renal insufficiency     w/u NEG    No past surgical history on file.  History   Social History  . Marital Status: Single    Spouse Name: N/A    Number of Children: N/A  . Years of Education: N/A   Occupational History  . Production manager     for [employer]   Social History Main Topics  . Smoking status: Never Smoker   . Smokeless tobacco: Not on file  . Alcohol Use: Not on file  . Drug Use: Not on file  . Sexually Active: Not on file   Other Topics Concern  . Not on file   Social History Narrative  . No narrative on file    Current Outpatient Prescriptions on File Prior to Visit  Medication Sig Dispense Refill  . allopurinol (ZYLOPRIM) 300 MG tablet TAKE 1 TABLET (300 MG TOTAL) BY MOUTH DAILY.  90 tablet  2  . amLODipine (NORVASC) 5 MG tablet Take 1 tablet (5 mg total) by mouth daily.  30 tablet  11  . aspirin 81 MG tablet Take 81 mg by mouth daily.        . Bimatoprost (LUMIGAN) 0.01 % SOLN Apply to eye. 1 drop in right eye at bedtime       . fenofibrate 54 MG tablet TAKE ONE TABLET BY MOUTH DAILY  30 tablet  3  . glimepiride (AMARYL) 4 MG tablet Take 1 tablet (4 mg total) by mouth daily before breakfast.  30 tablet  6  . glucose blood (ONE TOUCH ULTRA TEST) test strip Use test strips  once daily as directed       . Lancets (ONETOUCH ULTRASOFT) lancets 1 each by Other route daily. Use as instructed once daily  100 each  3  . levothyroxine (SYNTHROID, LEVOTHROID) 125 MCG tablet TAKE ONE TABLET BY MOUTH DAILY  90 tablet  1  . losartan-hydrochlorothiazide (HYZAAR) 100-25 MG per tablet TAKE 1 TABLET BY MOUTH DAILY.  30 tablet  3  . metFORMIN (GLUCOPHAGE-XR) 500 MG 24 hr tablet TAKE TWO TABLETS BY MOUTH TWICE DAILY  60 tablet  4  . metoprolol (LOPRESSOR) 50 MG tablet TAKE 1 TABLET (50 MG TOTAL) BY MOUTH TWICE  DAILY  180 tablet  2  . saxagliptin HCl (ONGLYZA) 5 MG TABS tablet Take 5 mg by mouth daily.      . simvastatin (ZOCOR) 10 MG tablet TAKE 1 TABLET (10 MG TOTAL) BY MOUTH AT BEDTIME.  90 tablet  2  . hydrALAZINE (APRESOLINE) 10 MG tablet Take 1 tablet (10 mg total) by mouth 3 (three) times daily.  90 tablet  11   No current facility-administered medications on file prior to visit.    Allergies  Allergen  Reactions  . Penicillins     REACTION: Rash  . Pioglitazone     REACTION: edema  . Tetracycline     REACTION: Rash    Family History  Problem Relation Age of Onset  . Cancer Mother     Breast Cancer  . Cancer Sister     Breast Cancer    BP 126/74  Pulse 80  Wt 242 lb (109.77 kg)  BMI 35.72 kg/m2  SpO2 98%  Review of Systems denies hypoglycemia    Objective:   Physical Exam VITAL SIGNS:  See vs page GENERAL: no distress Pulses: dorsalis pedis intact bilat.   Feet: no deformity.  no ulcer on the feet.  feet are of normal color and temp.  no edema Neuro: sensation is intact to touch on the feet      Assessment & Plan:  Type 2 DM, apparently well-controlled.  Due to no insurance, he can skip a1c until then

## 2012-04-11 NOTE — Patient Instructions (Addendum)
Please come back for a "welcome to medicare" appointment in 2 months.  Please continue the same medications.

## 2012-05-21 ENCOUNTER — Other Ambulatory Visit: Payer: Self-pay | Admitting: *Deleted

## 2012-05-21 ENCOUNTER — Other Ambulatory Visit: Payer: Self-pay

## 2012-05-21 ENCOUNTER — Other Ambulatory Visit: Payer: Self-pay | Admitting: Endocrinology

## 2012-05-21 MED ORDER — LEVOTHYROXINE SODIUM 125 MCG PO TABS: 125.0000 ug | ORAL_TABLET | Freq: Every day | ORAL | Status: DC

## 2012-05-21 MED ORDER — GLIMEPIRIDE 4 MG PO TABS: 4.0000 mg | ORAL_TABLET | Freq: Every day | ORAL | Status: DC

## 2012-05-28 ENCOUNTER — Other Ambulatory Visit: Payer: Self-pay | Admitting: *Deleted

## 2012-05-28 ENCOUNTER — Other Ambulatory Visit: Payer: Self-pay | Admitting: Endocrinology

## 2012-05-28 MED ORDER — METFORMIN HCL ER 500 MG PO TB24: ORAL_TABLET | ORAL | Status: DC

## 2012-06-13 ENCOUNTER — Ambulatory Visit: Payer: Self-pay | Admitting: Endocrinology

## 2012-06-15 ENCOUNTER — Other Ambulatory Visit: Payer: Self-pay | Admitting: Endocrinology

## 2012-06-20 ENCOUNTER — Other Ambulatory Visit: Payer: Self-pay | Admitting: Endocrinology

## 2012-06-25 ENCOUNTER — Encounter: Payer: Self-pay | Admitting: Internal Medicine

## 2012-07-04 ENCOUNTER — Encounter: Payer: Self-pay | Admitting: Endocrinology

## 2012-07-04 ENCOUNTER — Ambulatory Visit (INDEPENDENT_AMBULATORY_CARE_PROVIDER_SITE_OTHER): Payer: Medicare PPO | Admitting: Endocrinology

## 2012-07-04 VITALS — BP 132/74 | HR 79 | Ht 69.0 in | Wt 243.0 lb

## 2012-07-04 DIAGNOSIS — M109 Gout, unspecified: Secondary | ICD-10-CM

## 2012-07-04 DIAGNOSIS — E7800 Pure hypercholesterolemia, unspecified: Secondary | ICD-10-CM

## 2012-07-04 DIAGNOSIS — I10 Essential (primary) hypertension: Secondary | ICD-10-CM

## 2012-07-04 DIAGNOSIS — Z125 Encounter for screening for malignant neoplasm of prostate: Secondary | ICD-10-CM

## 2012-07-04 DIAGNOSIS — D649 Anemia, unspecified: Secondary | ICD-10-CM

## 2012-07-04 DIAGNOSIS — Z0000 Encounter for general adult medical examination without abnormal findings: Secondary | ICD-10-CM

## 2012-07-04 DIAGNOSIS — E119 Type 2 diabetes mellitus without complications: Secondary | ICD-10-CM

## 2012-07-04 DIAGNOSIS — E039 Hypothyroidism, unspecified: Secondary | ICD-10-CM

## 2012-07-04 DIAGNOSIS — K7689 Other specified diseases of liver: Secondary | ICD-10-CM

## 2012-07-04 LAB — LIPID PANEL
Cholesterol: 256 mg/dL — ABNORMAL HIGH (ref 0–200)
HDL: 41.8 mg/dL (ref >39.00)
Triglycerides: 588 mg/dL — ABNORMAL HIGH (ref 0.0–149.0)
VLDL: 117.6 mg/dL — ABNORMAL HIGH (ref 0.0–40.0)

## 2012-07-04 LAB — CBC WITH DIFFERENTIAL/PLATELET
Basophils Absolute: 0 10*3/uL (ref 0.0–0.1)
Eosinophils Absolute: 0.2 10*3/uL (ref 0.0–0.7)
Hemoglobin: 17 g/dL (ref 13.0–17.0)
Lymphocytes Relative: 35.7 % (ref 12.0–46.0)
Lymphs Abs: 4.8 10*3/uL — ABNORMAL HIGH (ref 0.7–4.0)
MCHC: 33.8 g/dL (ref 30.0–36.0)
Monocytes Absolute: 0.7 10*3/uL (ref 0.1–1.0)
Neutro Abs: 7.6 10*3/uL (ref 1.4–7.7)
RDW: 14.8 % — ABNORMAL HIGH (ref 11.5–14.6)

## 2012-07-04 LAB — BASIC METABOLIC PANEL
BUN: 17 mg/dL (ref 6–23)
CO2: 27 mEq/L (ref 19–32)
Chloride: 91 mEq/L — ABNORMAL LOW (ref 96–112)
Creatinine, Ser: 1.5 mg/dL (ref 0.4–1.5)

## 2012-07-04 LAB — HEPATIC FUNCTION PANEL
AST: 31 U/L (ref 0–37)
Albumin: 4.5 g/dL (ref 3.5–5.2)
Alkaline Phosphatase: 66 U/L (ref 39–117)
Bilirubin, Direct: 0.2 mg/dL (ref 0.0–0.3)
Total Protein: 8.4 g/dL — ABNORMAL HIGH (ref 6.0–8.3)

## 2012-07-04 LAB — URINALYSIS, ROUTINE W REFLEX MICROSCOPIC
Bilirubin Urine: NEGATIVE
Ketones, ur: NEGATIVE
Leukocytes, UA: NEGATIVE
Specific Gravity, Urine: 1.015 (ref 1.000–1.030)
Urobilinogen, UA: 0.2 (ref 0.0–1.0)

## 2012-07-04 LAB — TSH: TSH: 5.75 u[IU]/mL — ABNORMAL HIGH (ref 0.35–5.50)

## 2012-07-04 LAB — IBC PANEL: Transferrin: 357.8 mg/dL (ref 212.0–360.0)

## 2012-07-04 MED ORDER — ATORVASTATIN CALCIUM 20 MG PO TABS: 20.0000 mg | ORAL_TABLET | Freq: Every day | ORAL | Status: DC

## 2012-07-04 MED ORDER — CANAGLIFLOZIN 300 MG PO TABS: 1.0000 | ORAL_TABLET | Freq: Every day | ORAL | Status: DC

## 2012-07-04 MED ORDER — GLUCOSE BLOOD VI STRP: 1.0000 | ORAL_STRIP | Freq: Every day | Status: DC

## 2012-07-04 NOTE — Patient Instructions (Addendum)
blood tests are being requested for you today.  We'll contact you with results. Please come back for a regular physical appointment in 3 months. check your blood sugar once a day.  vary the time of day when you check, between before the 3 meals, and at bedtime.  also check if you have symptoms of your blood sugar being too high or too low.  please keep a record of the readings and bring it to your next appointment here.  please call us sooner if your blood sugar goes below 70, or if you have a lot of readings over 200.   Here are some samples of "tradjenta."  Take 1 per day.  It is interchageable with the onglyza If the blood test says we need to add additional medication, we'll add "invokana."

## 2012-07-04 NOTE — Progress Notes (Signed)
Subjective:    Patient ID: Paul Banks, male    DOB: 02/17/1945, 67 y.o.   MRN: [id number]  HPI Pt returns for f/u of type 2 DM (dx'ed 2008; he has mild if any neuropathy of the lower extremities. no known associated complications; he did not tolerate [place] due to leg swelling; he did not tolerate parlodel due to dizziness; he did not tolerate actos due to edema).  no cbg record, but states cbg's are well-controlled.  He has not recently taken the onglyza. Past Medical History  Diagnosis Date  . HYPOTHYROIDISM 08/17/2006  . DM 06/12/2008  . HYPERCHOLESTEROLEMIA 05/02/2007  . Gout, unspecified 07/01/2009  . ANEMIA 07/01/2009  . HYPERTENSION 08/17/2006  . FATTY LIVER DISEASE 06/12/2008  . COUGH DUE TO ACE INHIBITORS 09/10/2008  . NASH (nonalcoholic steatohepatitis)   . Mild renal insufficiency     w/u NEG    No past surgical history on file.  History   Social History  . Marital Status: Single    Spouse Name: N/A    Number of Children: N/A  . Years of Education: N/A   Occupational History  . Production manager     for [employer]   Social History Main Topics  . Smoking status: Never Smoker   . Smokeless tobacco: Not on file  . Alcohol Use: Not on file  . Drug Use: Not on file  . Sexually Active: Not on file   Other Topics Concern  . Not on file   Social History Narrative  . No narrative on file    Current Outpatient Prescriptions on File Prior to Visit  Medication Sig Dispense Refill  . allopurinol (ZYLOPRIM) 300 MG tablet TAKE 1 TABLET (300 MG TOTAL) BY MOUTH DAILY.  90 tablet  2  . amLODipine (NORVASC) 5 MG tablet TAKE 1 TABLET (5 MG TOTAL) BY MOUTH DAILY.  90 tablet  10  . aspirin 81 MG tablet Take 81 mg by mouth daily.        . Bimatoprost (LUMIGAN) 0.01 % SOLN Apply to eye. 1 drop in right eye at bedtime       . fenofibrate 54 MG tablet TAKE ONE TABLET BY MOUTH DAILY  30 tablet  2  . glimepiride (AMARYL) 4 MG tablet Take 1 tablet (4 mg total) by mouth daily before  breakfast.  30 tablet  4  . Lancets (ONETOUCH ULTRASOFT) lancets 1 each by Other route daily. Use as instructed once daily  100 each  3  . levothyroxine (SYNTHROID, LEVOTHROID) 125 MCG tablet Take 1 tablet (125 mcg total) by mouth daily before breakfast.  90 tablet  1  . losartan-hydrochlorothiazide (HYZAAR) 100-25 MG per tablet TAKE 1 TABLET BY MOUTH DAILY.  30 tablet  2  . metFORMIN (GLUCOPHAGE-XR) 500 MG 24 hr tablet TAKE TWO TABLETS BY MOUTH TWICE DAILY  120 tablet  2  . metoprolol (LOPRESSOR) 50 MG tablet TAKE 1 TABLET (50 MG TOTAL) BY MOUTH TWICE  DAILY  180 tablet  2  . saxagliptin HCl (ONGLYZA) 5 MG TABS tablet Take 5 mg by mouth daily.      . hydrALAZINE (APRESOLINE) 10 MG tablet Take 1 tablet (10 mg total) by mouth 3 (three) times daily.  90 tablet  11   No current facility-administered medications on file prior to visit.    Allergies  Allergen Reactions  . Penicillins     REACTION: Rash  . Pioglitazone     REACTION: edema  . Tetracycline  REACTION: Rash    Family History  Problem Relation Age of Onset  . Cancer Mother     Breast Cancer  . Cancer Sister     Breast Cancer    BP 132/74  Pulse 79  Ht 5\' 9"  (1.753 m)  Wt 243 lb (110.224 kg)  BMI 35.87 kg/m2  SpO2 97%    Review of Systems Denies weight change and hypoglycemia.     Objective:   Physical Exam VITAL SIGNS:  See vs page GENERAL: no distress    Lab Results  Component Value Date   WBC 13.3* 07/04/2012   HGB 17.0 07/04/2012   HCT 50.2 07/04/2012   PLT 308.0 07/04/2012   GLUCOSE 215* 07/04/2012   CHOL 256* 07/04/2012   TRIG 588.0* 07/04/2012   HDL 41.80 07/04/2012   LDLDIRECT 126.0 07/04/2012   LDLCALC 81 09/08/2008   ALT 42 07/04/2012   AST 31 07/04/2012   NA 133* 07/04/2012   K 4.0 07/04/2012   CL 91* 07/04/2012   CREATININE 1.5 07/04/2012   BUN 17 07/04/2012   CO2 27 07/04/2012   TSH 5.75* 07/04/2012   PSA 0.59 07/04/2012   HGBA1C 9.2* 07/04/2012   MICROALBUR 4.4* 07/04/2012      Assessment &  Plan:  DM: he has failed oral agents.  He needs insulin. Dyslipidemia, needs increased rx Hypothyroidism, with TSH close to normal Hyponatremia, mild, prob due to hyzaar.

## 2012-07-12 ENCOUNTER — Other Ambulatory Visit: Payer: Self-pay | Admitting: *Deleted

## 2012-07-12 ENCOUNTER — Other Ambulatory Visit: Payer: Self-pay | Admitting: Endocrinology

## 2012-07-12 MED ORDER — METOPROLOL TARTRATE 50 MG PO TABS: 50.0000 mg | ORAL_TABLET | Freq: Two times a day (BID) | ORAL | Status: DC

## 2012-08-06 ENCOUNTER — Encounter: Payer: Self-pay | Admitting: Internal Medicine

## 2012-08-15 ENCOUNTER — Other Ambulatory Visit: Payer: Self-pay

## 2012-08-15 ENCOUNTER — Other Ambulatory Visit: Payer: Self-pay | Admitting: *Deleted

## 2012-08-15 ENCOUNTER — Other Ambulatory Visit: Payer: Self-pay | Admitting: Endocrinology

## 2012-08-15 MED ORDER — ALLOPURINOL 300 MG PO TABS: ORAL_TABLET | ORAL | Status: DC

## 2012-09-27 ENCOUNTER — Other Ambulatory Visit: Payer: Self-pay | Admitting: Endocrinology

## 2012-10-03 ENCOUNTER — Ambulatory Visit: Payer: Medicare PPO | Admitting: Endocrinology

## 2012-10-10 ENCOUNTER — Ambulatory Visit (INDEPENDENT_AMBULATORY_CARE_PROVIDER_SITE_OTHER): Payer: Medicare PPO | Admitting: Endocrinology

## 2012-10-10 ENCOUNTER — Encounter: Payer: Self-pay | Admitting: Endocrinology

## 2012-10-10 VITALS — BP 120/80 | HR 69 | Ht 69.0 in | Wt 210.0 lb

## 2012-10-10 DIAGNOSIS — E039 Hypothyroidism, unspecified: Secondary | ICD-10-CM

## 2012-10-10 DIAGNOSIS — E119 Type 2 diabetes mellitus without complications: Secondary | ICD-10-CM

## 2012-10-10 DIAGNOSIS — N289 Disorder of kidney and ureter, unspecified: Secondary | ICD-10-CM

## 2012-10-10 DIAGNOSIS — E7800 Pure hypercholesterolemia, unspecified: Secondary | ICD-10-CM

## 2012-10-10 DIAGNOSIS — Z0000 Encounter for general adult medical examination without abnormal findings: Secondary | ICD-10-CM

## 2012-10-10 LAB — HEMOGLOBIN A1C: Hgb A1c MFr Bld: 5.7 % (ref 4.6–6.5)

## 2012-10-10 MED ORDER — SAXAGLIPTIN HCL 5 MG PO TABS: 5.0000 mg | ORAL_TABLET | Freq: Every day | ORAL | Status: DC

## 2012-10-10 NOTE — Patient Instructions (Addendum)
blood tests are being requested for you today.  We'll contact you with results. Please stop the amlodipine. Please come back for a follow-up appointment in January. please consider these measures for your health:  minimize alcohol.  do not use tobacco products.  have a colonoscopy at least every 10 years from age 65.  keep firearms safely stored.  always use seat belts.  have working smoke alarms in your home.  see an eye doctor and dentist regularly.  never drive under the influence of alcohol or drugs (including prescription drugs).  those with fair skin should take precautions against the sun. it is critically important to prevent falling down (keep floor areas well-lit, dry, and free of loose objects.  If you have a cane, walker, or wheelchair, you should use it, even for short trips around the house.  Also, try not to rush)

## 2012-10-10 NOTE — Progress Notes (Signed)
Subjective:    Patient ID: Paul Banks, male    DOB: Sep 02, 1945, 67 y.o.   MRN: [id number]  HPI Pt is here for regular wellness examination, and is feeling pretty well in general, and says chronic med probs are stable, except as noted below Past Medical History  Diagnosis Date  . HYPOTHYROIDISM 08/17/2006  . DM 06/12/2008  . HYPERCHOLESTEROLEMIA 05/02/2007  . Gout, unspecified 07/01/2009  . ANEMIA 07/01/2009  . HYPERTENSION 08/17/2006  . FATTY LIVER DISEASE 06/12/2008  . COUGH DUE TO ACE INHIBITORS 09/10/2008  . NASH (nonalcoholic steatohepatitis)   . Mild renal insufficiency     w/u NEG    No past surgical history on file.  History   Social History  . Marital Status: Single    Spouse Name: N/A    Number of Children: N/A  . Years of Education: N/A   Occupational History  . Production manager     for [employer]   Social History Main Topics  . Smoking status: Never Smoker   . Smokeless tobacco: Not on file  . Alcohol Use: Not on file  . Drug Use: Not on file  . Sexual Activity: Not on file   Other Topics Concern  . Not on file   Social History Narrative  . No narrative on file    Current Outpatient Prescriptions on File Prior to Visit  Medication Sig Dispense Refill  . allopurinol (ZYLOPRIM) 300 MG tablet TAKE 1 TABLET (300 MG TOTAL) BY MOUTH DAILY.  90 tablet  2  . aspirin 81 MG tablet Take 81 mg by mouth daily.        Marland Kitchen atorvastatin (LIPITOR) 20 MG tablet Take 1 tablet (20 mg total) by mouth daily.  90 tablet  3  . Bimatoprost (LUMIGAN) 0.01 % SOLN Apply to eye. 1 drop in right eye at bedtime       . glucose blood (ONE TOUCH ULTRA TEST) test strip 1 each by Other route daily. And lancets 1/day 250.00  100 each  3  . Lancets (ONETOUCH ULTRASOFT) lancets 1 each by Other route daily. Use as instructed once daily  100 each  3  . levothyroxine (SYNTHROID, LEVOTHROID) 125 MCG tablet Take 1 tablet (125 mcg total) by mouth daily before breakfast.  90 tablet  1  .  losartan-hydrochlorothiazide (HYZAAR) 100-25 MG per tablet TAKE 1 TABLET BY MOUTH DAILY.  30 tablet  2  . metFORMIN (GLUCOPHAGE-XR) 500 MG 24 hr tablet TAKE TWO TABLETS BY MOUTH TWICE DAILY  120 tablet  2  . metoprolol (LOPRESSOR) 50 MG tablet Take 1 tablet (50 mg total) by mouth 2 (two) times daily.  180 tablet  2  . fenofibrate 54 MG tablet TAKE ONE TABLET BY MOUTH DAILY  30 tablet  2  . losartan-hydrochlorothiazide (HYZAAR) 100-25 MG per tablet TAKE 1 TABLET BY MOUTH DAILY.  30 tablet  2   No current facility-administered medications on file prior to visit.    Allergies  Allergen Reactions  . Penicillins     REACTION: Rash  . Pioglitazone     REACTION: edema  . Tetracycline     REACTION: Rash    Family History  Problem Relation Age of Onset  . Cancer Mother     Breast Cancer  . Cancer Sister     Breast Cancer    BP 120/80  Pulse 69  Ht 5\' 9"  (1.753 m)  Wt 210 lb (95.255 kg)  BMI 31 kg/m2  SpO2 99%  Review  of Systems  Constitutional: Negative for fever.  HENT: Negative for hearing loss.   Eyes: Negative for visual disturbance.  Cardiovascular: Negative for chest pain.  Gastrointestinal: Negative for abdominal pain.  Endocrine: Negative for cold intolerance.  Genitourinary: Negative for hematuria and difficulty urinating.  Musculoskeletal: Negative for back pain.  Skin: Negative for rash.  Allergic/Immunologic: Negative for environmental allergies.  Neurological: Negative for syncope.  Hematological: Does not bruise/bleed easily.  Psychiatric/Behavioral: Negative for dysphoric mood.       Objective:   Physical Exam VS: see vs page GEN: no distress HEAD: head: no deformity eyes: no periorbital swelling, no proptosis external nose and ears are normal mouth: no lesion seen NECK: supple, thyroid is not enlarged CHEST WALL: no deformity LUNGS: clear to auscultation BREASTS:  No gynecomastia ABD: abdomen is soft, nontender.  no hepatosplenomegaly.  not  distended.  no hernia RECTAL: normal external and internal exam.  heme neg. PROSTATE:  Normal size.  No nodule MUSCULOSKELETAL: muscle bulk and strength are grossly normal.  no obvious joint swelling.  gait is normal and steady PULSES: no carotid bruit NEURO:  cn 2-12 grossly intact.   readily moves all 4's.  SKIN:  Normal texture and temperature.  No rash or suspicious lesion is visible.   NODES:  None palpable at the neck PSYCH: alert, oriented x3.  Does not appear anxious nor depressed.     Assessment & Plan:  Wellness visit today, with problems stable, except as noted. we discussed code status.  pt requests full code, but would not want to be started or maintained on artificial life-support measures if there was not a reasonable chance of recovery.    SEPARATE EVALUATION FOLLOWS--EACH PROBLEM HERE IS NEW, NOT RESPONDING TO TREATMENT, OR POSES SIGNIFICANT RISK TO THE PATIENT'S HEALTH: HISTORY OF THE PRESENT ILLNESS: Pt returns for f/u of type 2 DM (dx'ed 2008; he has mild if any neuropathy of the lower extremities. no known associated complications; he did not tolerate [place] due to leg swelling; he did not tolerate parlodel due to dizziness; he did not tolerate actos due to edema).  He states cbg's are well-controlled. He does not take invokana or glimiperide.   He has lost weight He takes BP meds as rx'ed.  He reports intermittent leg edema PAST MEDICAL HISTORY reviewed and up to date today REVIEW OF SYSTEMS: Denies numbness and sob PHYSICAL EXAMINATION: VITAL SIGNS:  See vs page GENERAL: no distress HEART:  Regular rate and rhythm without murmurs noted. Normal S1,S2.   LAB/XRAY RESULTS: i reviewed electrocardiogram. IMPRESSION: DM: he needs increased rx Weight loss: this helps his health HTN: overcontrolled, due to weight loss. PLAN: See instruction page.

## 2012-10-11 LAB — BASIC METABOLIC PANEL WITH GFR
BUN: 15 mg/dL (ref 6–23)
CO2: 24 meq/L (ref 19–32)
Calcium: 10.1 mg/dL (ref 8.4–10.5)
Chloride: 100 meq/L (ref 96–112)
Creatinine, Ser: 1.5 mg/dL (ref 0.4–1.5)
GFR: 51.44 mL/min — ABNORMAL LOW
Glucose, Bld: 111 mg/dL — ABNORMAL HIGH (ref 70–99)
Potassium: 3.8 meq/L (ref 3.5–5.1)
Sodium: 138 meq/L (ref 135–145)

## 2012-10-11 LAB — LIPID PANEL
Cholesterol: 150 mg/dL (ref 0–200)
HDL: 45 mg/dL
LDL Cholesterol: 73 mg/dL (ref 0–99)
Total CHOL/HDL Ratio: 3
Triglycerides: 158 mg/dL — ABNORMAL HIGH (ref 0.0–149.0)
VLDL: 31.6 mg/dL (ref 0.0–40.0)

## 2012-10-11 LAB — TSH: TSH: 1.31 u[IU]/mL (ref 0.35–5.50)

## 2012-10-11 LAB — PTH, INTACT AND CALCIUM
Calcium: 10.4 mg/dL (ref 8.4–10.5)
PTH: 18.9 pg/mL (ref 14.0–72.0)

## 2012-10-12 ENCOUNTER — Other Ambulatory Visit: Payer: Self-pay | Admitting: Endocrinology

## 2012-10-14 ENCOUNTER — Other Ambulatory Visit: Payer: Self-pay | Admitting: Endocrinology

## 2012-10-16 ENCOUNTER — Encounter: Payer: Self-pay | Admitting: Endocrinology

## 2012-10-16 DIAGNOSIS — Z0279 Encounter for issue of other medical certificate: Secondary | ICD-10-CM

## 2012-10-24 ENCOUNTER — Encounter: Payer: Medicare PPO | Admitting: Internal Medicine

## 2012-11-15 ENCOUNTER — Other Ambulatory Visit: Payer: Self-pay

## 2012-12-01 ENCOUNTER — Other Ambulatory Visit: Payer: Self-pay | Admitting: Endocrinology

## 2012-12-03 ENCOUNTER — Other Ambulatory Visit: Payer: Self-pay | Admitting: *Deleted

## 2012-12-03 MED ORDER — LOSARTAN POTASSIUM-HCTZ 100-25 MG PO TABS: 1.0000 | ORAL_TABLET | Freq: Every day | ORAL | Status: DC

## 2012-12-03 MED ORDER — METFORMIN HCL ER 500 MG PO TB24: ORAL_TABLET | ORAL | Status: DC

## 2012-12-03 MED ORDER — FENOFIBRATE 54 MG PO TABS: 54.0000 mg | ORAL_TABLET | Freq: Every day | ORAL | Status: DC

## 2012-12-17 ENCOUNTER — Other Ambulatory Visit: Payer: Self-pay | Admitting: Endocrinology

## 2013-01-16 ENCOUNTER — Ambulatory Visit (INDEPENDENT_AMBULATORY_CARE_PROVIDER_SITE_OTHER): Payer: Commercial Managed Care - HMO | Admitting: Endocrinology

## 2013-01-16 ENCOUNTER — Encounter: Payer: Self-pay | Admitting: Endocrinology

## 2013-01-16 VITALS — BP 140/80 | HR 65 | Temp 97.9°F | Ht 69.0 in | Wt 195.0 lb

## 2013-01-16 DIAGNOSIS — E119 Type 2 diabetes mellitus without complications: Secondary | ICD-10-CM

## 2013-01-16 MED ORDER — ALLOPURINOL 100 MG PO TABS: 100.0000 mg | ORAL_TABLET | Freq: Every day | ORAL | Status: DC

## 2013-01-16 MED ORDER — ATORVASTATIN CALCIUM 10 MG PO TABS: 10.0000 mg | ORAL_TABLET | Freq: Every day | ORAL | Status: DC

## 2013-01-16 NOTE — Patient Instructions (Addendum)
blood tests are being requested for you today.  We'll contact you with results. Please reduce the metformin to just 1 pill per day. You can also stop the fenofibrate.  Please reduce the allopurinol to just 100 mg daily, and lipitor to 10 mg daily. Please come back for a follow-up appointment in 3 months.

## 2013-01-16 NOTE — Progress Notes (Signed)
Subjective:    Patient ID: Paul Banks, male    DOB: 1945-02-25, 68 y.o.   MRN: [id number]  HPI Pt returns for f/u of type 2 DM (dx'ed 2008; he has mild if any neuropathy of the lower extremities. no known associated complications; he did not tolerate [place] due to leg swelling; he did not tolerate parlodel due to dizziness; he did not tolerate actos due to edema). he brings a record of his cbg's which i have reviewed today.  All are less than 100.  He never took the onglyza.  denies hypoglycemia Denies sob No recent gout sxs. Past Medical History  Diagnosis Date  . HYPOTHYROIDISM 08/17/2006  . DM 06/12/2008  . HYPERCHOLESTEROLEMIA 05/02/2007  . Gout, unspecified 07/01/2009  . ANEMIA 07/01/2009  . HYPERTENSION 08/17/2006  . FATTY LIVER DISEASE 06/12/2008  . COUGH DUE TO ACE INHIBITORS 09/10/2008  . NASH (nonalcoholic steatohepatitis)   . Mild renal insufficiency     w/u NEG    No past surgical history on file.  History   Social History  . Marital Status: Single    Spouse Name: N/A    Number of Children: N/A  . Years of Education: N/A   Occupational History  . Production managerBuyer     for [employer]   Social History Main Topics  . Smoking status: Never Smoker   . Smokeless tobacco: Not on file  . Alcohol Use: Not on file  . Drug Use: Not on file  . Sexual Activity: Not on file   Other Topics Concern  . Not on file   Social History Narrative  . No narrative on file    Current Outpatient Prescriptions on File Prior to Visit  Medication Sig Dispense Refill  . aspirin 81 MG tablet Take 81 mg by mouth daily.        . Bimatoprost (LUMIGAN) 0.01 % SOLN Apply to eye. 1 drop in right eye at bedtime       . glucose blood (ONE TOUCH ULTRA TEST) test strip 1 each by Other route daily. And lancets 1/day 250.00  100 each  3  . Lancets (ONETOUCH ULTRASOFT) lancets 1 each by Other route daily. Use as instructed once daily  100 each  3  . levothyroxine (SYNTHROID, LEVOTHROID) 125 MCG tablet  Take 1 tablet (125 mcg total) by mouth daily before breakfast.  90 tablet  1  . losartan-hydrochlorothiazide (HYZAAR) 100-25 MG per tablet Take 1 tablet by mouth daily.  30 tablet  2  . metoprolol (LOPRESSOR) 50 MG tablet Take 1 tablet (50 mg total) by mouth 2 (two) times daily.  180 tablet  2   No current facility-administered medications on file prior to visit.    Allergies  Allergen Reactions  . Penicillins     REACTION: Rash  . Pioglitazone     REACTION: edema  . Tetracycline     REACTION: Rash    Family History  Problem Relation Age of Onset  . Cancer Mother     Breast Cancer  . Cancer Sister     Breast Cancer   BP 140/80  Pulse 65  Temp(Src) 97.9 F (36.6 C) (Oral)  Ht 5\' 9"  (1.753 m)  Wt 195 lb (88.451 kg)  BMI 28.78 kg/m2  SpO2 99%  Review of Systems He continues to lose weight.  Denies chest pain    Objective:   Physical Exam VITAL SIGNS:  See vs page GENERAL: no distress     Assessment & Plan:  weight loss, due to his efforts DM: weight loss reduces need for meds Gout: weight loss reduces need for meds HTN: he cannot reduce meds for this, despite weight-loss

## 2013-02-06 ENCOUNTER — Encounter: Payer: Self-pay | Admitting: Internal Medicine

## 2013-02-15 ENCOUNTER — Other Ambulatory Visit: Payer: Self-pay | Admitting: Endocrinology

## 2013-03-26 ENCOUNTER — Other Ambulatory Visit: Payer: Self-pay | Admitting: Endocrinology

## 2013-04-06 ENCOUNTER — Other Ambulatory Visit: Payer: Self-pay | Admitting: Endocrinology

## 2013-04-08 MED ORDER — ONETOUCH ULTRASOFT LANCETS MISC: Status: DC

## 2013-04-08 NOTE — Addendum Note (Signed)
Addended by: Bethann PunchesUCK, MEGAN E on: 04/08/2013 09:11 AM   Modules accepted: Orders

## 2013-04-10 ENCOUNTER — Other Ambulatory Visit: Payer: Self-pay | Admitting: Endocrinology

## 2013-04-24 ENCOUNTER — Ambulatory Visit: Payer: Medicare PPO | Admitting: Endocrinology

## 2013-05-08 ENCOUNTER — Ambulatory Visit (INDEPENDENT_AMBULATORY_CARE_PROVIDER_SITE_OTHER): Payer: Medicare PPO | Admitting: Endocrinology

## 2013-05-08 ENCOUNTER — Encounter: Payer: Self-pay | Admitting: Endocrinology

## 2013-05-08 VITALS — BP 130/92 | HR 72 | Temp 98.4°F | Ht 69.0 in | Wt 192.0 lb

## 2013-05-08 DIAGNOSIS — E7800 Pure hypercholesterolemia, unspecified: Secondary | ICD-10-CM

## 2013-05-08 DIAGNOSIS — M109 Gout, unspecified: Secondary | ICD-10-CM

## 2013-05-08 DIAGNOSIS — E119 Type 2 diabetes mellitus without complications: Secondary | ICD-10-CM

## 2013-05-08 LAB — URIC ACID: URIC ACID, SERUM: 5.2 mg/dL (ref 4.0–7.8)

## 2013-05-08 LAB — LIPID PANEL
Cholesterol: 177 mg/dL (ref 0–200)
HDL: 45.9 mg/dL (ref >39.00)
LDL Cholesterol: 72 mg/dL (ref 0–99)
TRIGLYCERIDES: 296 mg/dL — AB (ref 0.0–149.0)
Total CHOL/HDL Ratio: 4
VLDL: 59.2 mg/dL — ABNORMAL HIGH (ref 0.0–40.0)

## 2013-05-08 LAB — HEMOGLOBIN A1C: HEMOGLOBIN A1C: 5.6 % (ref 4.6–6.5)

## 2013-05-08 MED ORDER — ONETOUCH ULTRASOFT LANCETS MISC: Status: DC

## 2013-05-08 MED ORDER — GLUCOSE BLOOD VI STRP: 1.0000 | ORAL_STRIP | Freq: Every day | Status: DC

## 2013-05-08 MED ORDER — LEVOTHYROXINE SODIUM 125 MCG PO TABS: 125.0000 ug | ORAL_TABLET | Freq: Every day | ORAL | Status: DC

## 2013-05-08 NOTE — Patient Instructions (Addendum)
blood tests are being requested for you today.  We'll contact you with results. check your blood sugar once a day.  vary the time of day when you check, between before the 3 meals, and at bedtime.  also check if you have symptoms of your blood sugar being too high or too low.  please keep a record of the readings and bring it to your next appointment here.  You can write it on any piece of paper.  please call us sooner if your blood sugar goes below 70, or if you have a lot of readings over 200. Please come back for a regular physical appointment in 6 months.

## 2013-05-08 NOTE — Progress Notes (Signed)
Subjective:    Patient ID: Paul Banks, male    DOB: 1945-07-22, 68 y.o.   MRN: [id number]  HPI Pt returns for f/u of type 2 DM (dx'ed 2008; he has mild if any neuropathy of the lower extremities. no known associated complications; he has never taken insulin; he did not tolerate [place] due to leg swelling; he did not tolerate parlodel due to dizziness; he did not tolerate actos due to edema; after losing 50 lbs in 2014, he needs only the metformin). no cbg record, but states cbg's are well-controlled.  He has lost a few more lbs.  Past Medical History  Diagnosis Date  . HYPOTHYROIDISM 08/17/2006  . DM 06/12/2008  . HYPERCHOLESTEROLEMIA 05/02/2007  . Gout, unspecified 07/01/2009  . ANEMIA 07/01/2009  . HYPERTENSION 08/17/2006  . FATTY LIVER DISEASE 06/12/2008  . COUGH DUE TO ACE INHIBITORS 09/10/2008  . NASH (nonalcoholic steatohepatitis)   . Mild renal insufficiency     w/u NEG    No past surgical history on file.  History   Social History  . Marital Status: Single    Spouse Name: N/A    Number of Children: N/A  . Years of Education: N/A   Occupational History  . Production managerBuyer     for [employer]   Social History Main Topics  . Smoking status: Never Smoker   . Smokeless tobacco: Not on file  . Alcohol Use: Not on file  . Drug Use: Not on file  . Sexual Activity: Not on file   Other Topics Concern  . Not on file   Social History Narrative  . No narrative on file    Current Outpatient Prescriptions on File Prior to Visit  Medication Sig Dispense Refill  . allopurinol (ZYLOPRIM) 100 MG tablet Take 1 tablet (100 mg total) by mouth daily.  90 tablet  3  . aspirin 81 MG tablet Take 81 mg by mouth daily.        Marland Kitchen. atorvastatin (LIPITOR) 10 MG tablet Take 1 tablet (10 mg total) by mouth daily.  90 tablet  3  . Bimatoprost (LUMIGAN) 0.01 % SOLN Apply to eye. 1 drop in right eye at bedtime       . losartan-hydrochlorothiazide (HYZAAR) 100-25 MG per tablet TAKE 1 TABLET BY MOUTH  DAILY.  30 tablet  2  . metFORMIN (GLUCOPHAGE-XR) 500 MG 24 hr tablet Take 500 mg by mouth daily with breakfast.      . metoprolol (LOPRESSOR) 50 MG tablet TAKE 1 TABLET (50 MG TOTAL) BY MOUTH 2 (TWO) TIMES DAILY.  180 tablet  1   No current facility-administered medications on file prior to visit.    Allergies  Allergen Reactions  . Penicillins     REACTION: Rash  . Pioglitazone     REACTION: edema  . Tetracycline     REACTION: Rash    Family History  Problem Relation Age of Onset  . Cancer Mother     Breast Cancer  . Cancer Sister     Breast Cancer    BP 130/92  Pulse 72  Temp(Src) 98.4 F (36.9 C) (Oral)  Ht 5\' 9"  (1.753 m)  Wt 192 lb (87.091 kg)  BMI 28.34 kg/m2  SpO2 96%   Review of Systems denies hypoglycemia.  No recent gout sxs.     Objective:   Physical Exam VITAL SIGNS:  See vs page GENERAL: no distress   Lab Results  Component Value Date   HGBA1C 5.6 05/08/2013  Lab Results  Component Value Date   CHOL 177 05/08/2013   HDL 45.90 05/08/2013   LDLCALC 72 05/08/2013   LDLDIRECT 126.0 07/04/2012   TRIG 296.0* 05/08/2013   CHOLHDL 4 05/08/2013   Uric acid=5.2    Assessment & Plan:  HTN: this will improve with further weight loss.  DM: well-controlled. Dyslipidemia:  this will also improve with further weight loss.  Gout: well-controlled.

## 2013-05-09 ENCOUNTER — Encounter: Payer: Self-pay | Admitting: Endocrinology

## 2013-06-25 ENCOUNTER — Other Ambulatory Visit: Payer: Self-pay | Admitting: Endocrinology

## 2013-08-26 ENCOUNTER — Other Ambulatory Visit: Payer: Self-pay | Admitting: Endocrinology

## 2013-10-07 ENCOUNTER — Other Ambulatory Visit: Payer: Self-pay | Admitting: Endocrinology

## 2013-10-25 ENCOUNTER — Other Ambulatory Visit: Payer: Self-pay

## 2013-11-06 ENCOUNTER — Ambulatory Visit: Payer: Medicare PPO | Admitting: Endocrinology

## 2013-11-13 ENCOUNTER — Ambulatory Visit (INDEPENDENT_AMBULATORY_CARE_PROVIDER_SITE_OTHER): Payer: Commercial Managed Care - HMO | Admitting: Endocrinology

## 2013-11-13 ENCOUNTER — Encounter: Payer: Self-pay | Admitting: Endocrinology

## 2013-11-13 VITALS — BP 138/88 | HR 62 | Temp 98.3°F | Ht 69.0 in | Wt 200.0 lb

## 2013-11-13 DIAGNOSIS — E7800 Pure hypercholesterolemia, unspecified: Secondary | ICD-10-CM

## 2013-11-13 DIAGNOSIS — N289 Disorder of kidney and ureter, unspecified: Secondary | ICD-10-CM

## 2013-11-13 DIAGNOSIS — I10 Essential (primary) hypertension: Secondary | ICD-10-CM

## 2013-11-13 DIAGNOSIS — E039 Hypothyroidism, unspecified: Secondary | ICD-10-CM

## 2013-11-13 DIAGNOSIS — Z0000 Encounter for general adult medical examination without abnormal findings: Secondary | ICD-10-CM

## 2013-11-13 DIAGNOSIS — E1122 Type 2 diabetes mellitus with diabetic chronic kidney disease: Secondary | ICD-10-CM

## 2013-11-13 DIAGNOSIS — Z23 Encounter for immunization: Secondary | ICD-10-CM

## 2013-11-13 DIAGNOSIS — M109 Gout, unspecified: Secondary | ICD-10-CM

## 2013-11-13 DIAGNOSIS — N189 Chronic kidney disease, unspecified: Secondary | ICD-10-CM

## 2013-11-13 DIAGNOSIS — Z125 Encounter for screening for malignant neoplasm of prostate: Secondary | ICD-10-CM

## 2013-11-13 LAB — BASIC METABOLIC PANEL
BUN: 23 mg/dL (ref 6–23)
CHLORIDE: 98 meq/L (ref 96–112)
CO2: 27 meq/L (ref 19–32)
Calcium: 10.4 mg/dL (ref 8.4–10.5)
Creatinine, Ser: 1.3 mg/dL (ref 0.4–1.5)
GFR: 58.1 mL/min — ABNORMAL LOW (ref >60.00)
Glucose, Bld: 131 mg/dL — ABNORMAL HIGH (ref 70–99)
POTASSIUM: 4.5 meq/L (ref 3.5–5.1)
SODIUM: 137 meq/L (ref 135–145)

## 2013-11-13 LAB — HEPATIC FUNCTION PANEL
ALT: 20 U/L (ref 0–53)
AST: 22 U/L (ref 0–37)
Albumin: 3.8 g/dL (ref 3.5–5.2)
Alkaline Phosphatase: 55 U/L (ref 39–117)
Bilirubin, Direct: 0.2 mg/dL (ref 0.0–0.3)
TOTAL PROTEIN: 8.1 g/dL (ref 6.0–8.3)
Total Bilirubin: 1.2 mg/dL (ref 0.2–1.2)

## 2013-11-13 LAB — PSA: PSA: 0.88 ng/mL (ref 0.10–4.00)

## 2013-11-13 LAB — MICROALBUMIN / CREATININE URINE RATIO
Creatinine,U: 52.9 mg/dL
MICROALB/CREAT RATIO: 0.9 mg/g (ref 0.0–30.0)
Microalb, Ur: 0.5 mg/dL (ref 0.0–1.9)

## 2013-11-13 LAB — CBC WITH DIFFERENTIAL/PLATELET
BASOS PCT: 0.4 % (ref 0.0–3.0)
Basophils Absolute: 0 10*3/uL (ref 0.0–0.1)
Eosinophils Absolute: 0.1 10*3/uL (ref 0.0–0.7)
Eosinophils Relative: 1.1 % (ref 0.0–5.0)
HCT: 49.3 % (ref 39.0–52.0)
HEMOGLOBIN: 16.6 g/dL (ref 13.0–17.0)
LYMPHS PCT: 33.2 % (ref 12.0–46.0)
Lymphs Abs: 3.8 10*3/uL (ref 0.7–4.0)
MCHC: 33.7 g/dL (ref 30.0–36.0)
MCV: 88.8 fl (ref 78.0–100.0)
Monocytes Absolute: 0.6 10*3/uL (ref 0.1–1.0)
Monocytes Relative: 5.3 % (ref 3.0–12.0)
NEUTROS ABS: 6.9 10*3/uL (ref 1.4–7.7)
Neutrophils Relative %: 60 % (ref 43.0–77.0)
Platelets: 268 10*3/uL (ref 150.0–400.0)
RBC: 5.56 Mil/uL (ref 4.22–5.81)
RDW: 14.4 % (ref 11.5–15.5)
WBC: 11.4 10*3/uL — ABNORMAL HIGH (ref 4.0–10.5)

## 2013-11-13 LAB — URINALYSIS, ROUTINE W REFLEX MICROSCOPIC
Bilirubin Urine: NEGATIVE
Hgb urine dipstick: NEGATIVE
KETONES UR: NEGATIVE
Leukocytes, UA: NEGATIVE
Nitrite: NEGATIVE
RBC / HPF: NONE SEEN (ref 0–?)
TOTAL PROTEIN, URINE-UPE24: NEGATIVE
URINE GLUCOSE: NEGATIVE
Urobilinogen, UA: 0.2 (ref 0.0–1.0)
pH: 7 (ref 5.0–8.0)

## 2013-11-13 LAB — LIPID PANEL
CHOLESTEROL: 164 mg/dL (ref 0–200)
HDL: 50.2 mg/dL (ref >39.00)
LDL Cholesterol: 85 mg/dL (ref 0–99)
NonHDL: 113.8
TRIGLYCERIDES: 146 mg/dL (ref 0.0–149.0)
Total CHOL/HDL Ratio: 3
VLDL: 29.2 mg/dL (ref 0.0–40.0)

## 2013-11-13 LAB — TSH: TSH: 3.98 u[IU]/mL (ref 0.35–4.50)

## 2013-11-13 LAB — HEMOGLOBIN A1C: Hgb A1c MFr Bld: 5.8 % (ref 4.6–6.5)

## 2013-11-13 NOTE — Patient Instructions (Addendum)
blood tests are being requested for you today.  We'll contact you with results. please consider these measures for your health:  minimize alcohol.  do not use tobacco products.  have a colonoscopy at least every 10 years from age 68.  keep firearms safely stored.  always use seat belts.  have working smoke alarms in your home.  see an eye doctor and dentist regularly.  never drive under the influence of alcohol or drugs (including prescription drugs).  those with fair skin should take precautions against the sun.   Losing weight helps your blood pressure, sugar, cholesterol, and other health problems.   Please come back for a follow-up appointment in 4 months

## 2013-11-13 NOTE — Progress Notes (Signed)
Subjective:    Patient ID: Paul Banks, male    DOB: 03-03-1945, 68 y.o.   MRN: [id number]  HPI Pt is here for regular wellness examination, and is feeling pretty well in general, and says chronic med probs are stable, except as noted below.  Past Medical History  Diagnosis Date  . HYPOTHYROIDISM 08/17/2006  . DM 06/12/2008  . HYPERCHOLESTEROLEMIA 05/02/2007  . Gout, unspecified 07/01/2009  . ANEMIA 07/01/2009  . HYPERTENSION 08/17/2006  . FATTY LIVER DISEASE 06/12/2008  . COUGH DUE TO ACE INHIBITORS 09/10/2008  . NASH (nonalcoholic steatohepatitis)   . Mild renal insufficiency     w/u NEG    No past surgical history on file.  History   Social History  . Marital Status: Single    Spouse Name: N/A    Number of Children: N/A  . Years of Education: N/A   Occupational History  . Production managerBuyer     for [employer]   Social History Main Topics  . Smoking status: Never Smoker   . Smokeless tobacco: Not on file  . Alcohol Use: Not on file  . Drug Use: Not on file  . Sexual Activity: Not on file   Other Topics Concern  . Not on file   Social History Narrative    Current Outpatient Prescriptions on File Prior to Visit  Medication Sig Dispense Refill  . allopurinol (ZYLOPRIM) 100 MG tablet Take 1 tablet (100 mg total) by mouth daily. 90 tablet 3  . aspirin 81 MG tablet Take 81 mg by mouth daily.      Marland Kitchen. atorvastatin (LIPITOR) 10 MG tablet Take 1 tablet (10 mg total) by mouth daily. 90 tablet 3  . Bimatoprost (LUMIGAN) 0.01 % SOLN Apply to eye. 1 drop in right eye at bedtime     . glucose blood (ONE TOUCH ULTRA TEST) test strip 1 each by Other route daily. And lancets 1/day 250.00 100 each 3  . Lancets (ONETOUCH ULTRASOFT) lancets TEST ONCE DAILY 100 each 2  . levothyroxine (SYNTHROID, LEVOTHROID) 125 MCG tablet Take 1 tablet (125 mcg total) by mouth daily before breakfast. 90 tablet 1  . losartan-hydrochlorothiazide (HYZAAR) 100-25 MG per tablet TAKE 1 TABLET BY MOUTH DAILY. 30  tablet 1  . metFORMIN (GLUCOPHAGE-XR) 500 MG 24 hr tablet Take 500 mg by mouth daily with breakfast.    . metoprolol (LOPRESSOR) 50 MG tablet TAKE 1 TABLET (50 MG TOTAL) BY MOUTH 2 (TWO) TIMES DAILY. 180 tablet 0   No current facility-administered medications on file prior to visit.    Allergies  Allergen Reactions  . Penicillins     REACTION: Rash  . Pioglitazone     REACTION: edema  . Tetracycline     REACTION: Rash    Family History  Problem Relation Age of Onset  . Cancer Mother     Breast Cancer  . Cancer Sister     Breast Cancer    BP 138/88 mmHg  Pulse 62  Temp(Src) 98.3 F (36.8 C) (Oral)  Ht 5\' 9"  (1.753 m)  Wt 200 lb (90.719 kg)  BMI 29.52 kg/m2  SpO2 99%     Review of Systems  Constitutional: Negative for fever and unexpected weight change.  HENT: Negative for hearing loss.   Eyes: Negative for visual disturbance.  Respiratory: Negative for shortness of breath.   Cardiovascular: Negative for chest pain.  Gastrointestinal: Negative for anal bleeding.  Endocrine: Negative for cold intolerance.  Genitourinary: Negative for hematuria and difficulty  urinating.  Musculoskeletal: Negative for back pain.  Skin: Negative for rash.  Allergic/Immunologic: Negative for environmental allergies.  Neurological: Negative for numbness.  Hematological: Does not bruise/bleed easily.  Psychiatric/Behavioral: Negative for dysphoric mood.       Objective:   Physical Exam VS: see vs page GEN: no distress HEAD: head: no deformity eyes: no periorbital swelling, no proptosis external nose and ears are normal mouth: no lesion seen NECK: supple, thyroid is not enlarged CHEST WALL: no deformity LUNGS: clear to auscultation BREASTS:  No gynecomastia CV: reg rate and rhythm, no murmur ABD: abdomen is soft, nontender.  no hepatosplenomegaly.  not distended.  no hernia RECTAL: normal external and internal exam.  heme neg. PROSTATE:  Normal size.  No nodule.    MUSCULOSKELETAL: muscle bulk and strength are grossly normal.  no obvious joint swelling.  gait is normal and steady EXTEMITIES: no deformity.  no ulcer on the feet.  feet are of normal color and temp.  no edema PULSES: dorsalis pedis intact bilat.  no carotid bruit NEURO:  cn 2-12 grossly intact.   readily moves all 4's.  sensation is intact to touch on the feet SKIN:  Normal texture and temperature.  No rash or suspicious lesion is visible.   NODES:  None palpable at the neck PSYCH: alert, well-oriented.  Does not appear anxious nor depressed.    i reviewed electrocardiogram     Assessment & Plan:  Wellness visit today, with problems stable, except as noted. we discussed code status.  pt requests full code, but would not want to be started or maintained on artificial life-support measures if there was not a reasonable chance of recovery.    Patient is advised the following: Patient Instructions  blood tests are being requested for you today.  We'll contact you with results. please consider these measures for your health:  minimize alcohol.  do not use tobacco products.  have a colonoscopy at least every 10 years from age 89.  keep firearms safely stored.  always use seat belts.  have working smoke alarms in your home.  see an eye doctor and dentist regularly.  never drive under the influence of alcohol or drugs (including prescription drugs).  those with fair skin should take precautions against the sun.   Losing weight helps your blood pressure, sugar, cholesterol, and other health problems.   Please come back for a follow-up appointment in 4 months

## 2013-11-14 ENCOUNTER — Other Ambulatory Visit: Payer: Self-pay

## 2013-11-14 ENCOUNTER — Other Ambulatory Visit: Payer: Self-pay | Admitting: Endocrinology

## 2013-11-14 MED ORDER — LEVOTHYROXINE SODIUM 125 MCG PO TABS: 125.0000 ug | ORAL_TABLET | Freq: Every day | ORAL | Status: DC

## 2013-12-02 ENCOUNTER — Other Ambulatory Visit: Payer: Self-pay | Admitting: Endocrinology

## 2013-12-09 ENCOUNTER — Other Ambulatory Visit: Payer: Self-pay

## 2013-12-09 MED ORDER — LOSARTAN POTASSIUM-HCTZ 100-25 MG PO TABS: 1.0000 | ORAL_TABLET | Freq: Every day | ORAL | Status: DC

## 2013-12-16 ENCOUNTER — Encounter: Payer: Self-pay | Admitting: Endocrinology

## 2013-12-27 ENCOUNTER — Telehealth: Payer: Self-pay

## 2013-12-27 NOTE — Telephone Encounter (Signed)
Requested call back to discuss Flu Shot.

## 2013-12-31 ENCOUNTER — Other Ambulatory Visit: Payer: Self-pay | Admitting: Endocrinology

## 2014-01-06 ENCOUNTER — Other Ambulatory Visit: Payer: Self-pay | Admitting: Endocrinology

## 2014-02-07 ENCOUNTER — Other Ambulatory Visit: Payer: Self-pay | Admitting: Endocrinology

## 2014-02-17 ENCOUNTER — Other Ambulatory Visit: Payer: Self-pay | Admitting: Endocrinology

## 2014-03-12 ENCOUNTER — Ambulatory Visit: Payer: Commercial Managed Care - HMO | Admitting: Endocrinology

## 2014-03-20 ENCOUNTER — Other Ambulatory Visit: Payer: Self-pay | Admitting: Endocrinology

## 2014-03-21 ENCOUNTER — Other Ambulatory Visit: Payer: Self-pay | Admitting: *Deleted

## 2014-03-21 MED ORDER — ALLOPURINOL 100 MG PO TABS: 100.0000 mg | ORAL_TABLET | Freq: Every day | ORAL | Status: DC

## 2014-04-02 ENCOUNTER — Ambulatory Visit: Payer: Commercial Managed Care - HMO | Admitting: Endocrinology

## 2014-04-23 ENCOUNTER — Encounter: Payer: Self-pay | Admitting: Endocrinology

## 2014-04-23 ENCOUNTER — Ambulatory Visit (INDEPENDENT_AMBULATORY_CARE_PROVIDER_SITE_OTHER): Payer: Commercial Managed Care - HMO | Admitting: Endocrinology

## 2014-04-23 ENCOUNTER — Other Ambulatory Visit: Payer: Self-pay | Admitting: Endocrinology

## 2014-04-23 VITALS — BP 132/80 | HR 63 | Temp 98.4°F | Ht 69.0 in | Wt 204.0 lb

## 2014-04-23 DIAGNOSIS — N189 Chronic kidney disease, unspecified: Secondary | ICD-10-CM | POA: Diagnosis not present

## 2014-04-23 DIAGNOSIS — E1122 Type 2 diabetes mellitus with diabetic chronic kidney disease: Secondary | ICD-10-CM

## 2014-04-23 LAB — HEMOGLOBIN A1C: Hgb A1c MFr Bld: 5.9 % (ref 4.6–6.5)

## 2014-04-23 NOTE — Patient Instructions (Addendum)
blood tests are being requested for you today.  We'll contact you with results.   Losing weight helps your blood pressure, sugar, cholesterol, and other health problems.   Please come back for a follow-up appointment in 4 months.

## 2014-04-23 NOTE — Progress Notes (Signed)
Subjective:    Patient ID: Paul Banks, male    DOB: Feb 23, 1945, 69 y.o.   MRN: [id number]  HPI Pt returns for f/u of diabetes mellitus: DM type: 2 Dx'ed: 2008 Complications: none Therapy: metformin DKA: never Severe hypoglycemia: never Pancreatitis: never Other: he has never taken insulin; he did not tolerate [place] due to leg swelling; he did not tolerate parlodel due to dizziness; he did not tolerate actos due to edema; after losing 50 lbs in 2014, he needs only the metformin Interval history: no cbg record, but states cbg's are well-controlled.  pt states she feels well in general. Past Medical History  Diagnosis Date  . HYPOTHYROIDISM 08/17/2006  . DM 06/12/2008  . HYPERCHOLESTEROLEMIA 05/02/2007  . Gout, unspecified 07/01/2009  . ANEMIA 07/01/2009  . HYPERTENSION 08/17/2006  . FATTY LIVER DISEASE 06/12/2008  . COUGH DUE TO ACE INHIBITORS 09/10/2008  . NASH (nonalcoholic steatohepatitis)   . Mild renal insufficiency     w/u NEG    No past surgical history on file.  History   Social History  . Marital Status: Single    Spouse Name: N/A  . Number of Children: N/A  . Years of Education: N/A   Occupational History  . Production manager     for [employer]   Social History Main Topics  . Smoking status: Never Smoker   . Smokeless tobacco: Not on file  . Alcohol Use: Not on file  . Drug Use: Not on file  . Sexual Activity: Not on file   Other Topics Concern  . Not on file   Social History Narrative    Current Outpatient Prescriptions on File Prior to Visit  Medication Sig Dispense Refill  . allopurinol (ZYLOPRIM) 100 MG tablet Take 1 tablet (100 mg total) by mouth daily. 90 tablet 0  . aspirin 81 MG tablet Take 81 mg by mouth daily.      Marland Kitchen atorvastatin (LIPITOR) 10 MG tablet TAKE 1 TABLET (10 MG TOTAL) BY MOUTH DAILY. 90 tablet 2  . Bimatoprost (LUMIGAN) 0.01 % SOLN Apply to eye. 1 drop in right eye at bedtime     . glucose blood (ONE TOUCH ULTRA TEST) test strip 1  each by Other route daily. And lancets 1/day 250.00 100 each 3  . Lancets (ONETOUCH ULTRASOFT) lancets TEST ONCE DAILY 100 each 2  . levothyroxine (SYNTHROID, LEVOTHROID) 125 MCG tablet TAKE 1 TABLET (125 MCG TOTAL) BY MOUTH DAILY BEFORE BREAKFAST. 90 tablet 0  . metFORMIN (GLUCOPHAGE-XR) 500 MG 24 hr tablet Take 500 mg by mouth daily with breakfast.    . metoprolol (LOPRESSOR) 50 MG tablet TAKE 1 TABLET (50 MG TOTAL) BY MOUTH 2 (TWO) TIMES DAILY. 180 tablet 2   No current facility-administered medications on file prior to visit.    Allergies  Allergen Reactions  . Penicillins     REACTION: Rash  . Pioglitazone     REACTION: edema  . Tetracycline     REACTION: Rash    Family History  Problem Relation Age of Onset  . Cancer Mother     Breast Cancer  . Cancer Sister     Breast Cancer    BP 132/80 mmHg  Pulse 63  Temp(Src) 98.4 F (36.9 C) (Oral)  Ht  (1.753 m)  Wt 204 lb (92.534 kg)  BMI 30.11 kg/m2  SpO2 97%    Review of Systems He denies hypoglycemia    Objective:   Physical Exam VITAL SIGNS:  See vs page  GENERAL: no distress Pulses: dorsalis pedis intact bilat.   MSK: no deformity of the feet CV: no leg edema Skin:  no ulcer on the feet.  normal color and temp on the feet. Neuro: sensation is intact to touch on the feet    Lab Results  Component Value Date   HGBA1C 5.9 04/23/2014      Assessment & Plan:  DM: well-controlled.    Patient is advised the following: Patient Instructions  blood tests are being requested for you today.  We'll contact you with results.   Losing weight helps your blood pressure, sugar, cholesterol, and other health problems.   Please come back for a follow-up appointment in 4 months.    addendum: Please continue the same metformin.

## 2014-05-05 ENCOUNTER — Telehealth: Payer: Self-pay | Admitting: Endocrinology

## 2014-05-05 NOTE — Telephone Encounter (Signed)
Pt returned call about message that was left for him on his voice  The answer to  The questions left on voice mail about eye Dr is Dr. Hazle Quantigby  In 11/2013

## 2014-05-05 NOTE — Telephone Encounter (Signed)
I am not sure what this means?

## 2014-05-07 ENCOUNTER — Other Ambulatory Visit: Payer: Self-pay | Admitting: Endocrinology

## 2014-05-08 DIAGNOSIS — H2513 Age-related nuclear cataract, bilateral: Secondary | ICD-10-CM | POA: Diagnosis not present

## 2014-05-08 DIAGNOSIS — H40013 Open angle with borderline findings, low risk, bilateral: Secondary | ICD-10-CM | POA: Diagnosis not present

## 2014-05-08 DIAGNOSIS — E119 Type 2 diabetes mellitus without complications: Secondary | ICD-10-CM | POA: Diagnosis not present

## 2014-05-14 ENCOUNTER — Encounter: Payer: Self-pay | Admitting: Internal Medicine

## 2014-05-20 ENCOUNTER — Other Ambulatory Visit: Payer: Self-pay | Admitting: Endocrinology

## 2014-06-23 ENCOUNTER — Other Ambulatory Visit: Payer: Self-pay | Admitting: Endocrinology

## 2014-07-18 ENCOUNTER — Other Ambulatory Visit: Payer: Self-pay | Admitting: Endocrinology

## 2014-08-07 ENCOUNTER — Other Ambulatory Visit: Payer: Self-pay | Admitting: Endocrinology

## 2014-08-20 ENCOUNTER — Ambulatory Visit: Payer: Commercial Managed Care - HMO | Admitting: Endocrinology

## 2014-09-10 ENCOUNTER — Ambulatory Visit: Payer: Commercial Managed Care - HMO | Admitting: Endocrinology

## 2014-09-30 DIAGNOSIS — H2513 Age-related nuclear cataract, bilateral: Secondary | ICD-10-CM | POA: Diagnosis not present

## 2014-09-30 DIAGNOSIS — H4011X1 Primary open-angle glaucoma, mild stage: Secondary | ICD-10-CM | POA: Diagnosis not present

## 2014-10-01 ENCOUNTER — Ambulatory Visit (INDEPENDENT_AMBULATORY_CARE_PROVIDER_SITE_OTHER): Payer: Commercial Managed Care - HMO | Admitting: Endocrinology

## 2014-10-01 ENCOUNTER — Encounter: Payer: Self-pay | Admitting: Endocrinology

## 2014-10-01 VITALS — BP 138/88 | HR 71 | Temp 99.1°F | Ht 69.0 in | Wt 210.0 lb

## 2014-10-01 DIAGNOSIS — N189 Chronic kidney disease, unspecified: Secondary | ICD-10-CM | POA: Diagnosis not present

## 2014-10-01 DIAGNOSIS — E1122 Type 2 diabetes mellitus with diabetic chronic kidney disease: Secondary | ICD-10-CM

## 2014-10-01 LAB — POCT GLYCOSYLATED HEMOGLOBIN (HGB A1C): Hemoglobin A1C: 5.9

## 2014-10-01 NOTE — Progress Notes (Signed)
we discussed code status.  pt requests full code, but would not want to be started or maintained on artificial life-support measures if there was not a reasonable chance of recovery 

## 2014-10-01 NOTE — Progress Notes (Signed)
Subjective:    Patient ID: Paul Banks, male    DOB: 01-Oct-1945, 69 y.o.   MRN: [id number]  HPI Pt returns for f/u of diabetes mellitus: DM type: 2 Dx'ed: 2008 Complications: none Therapy: metformin DKA: never Severe hypoglycemia: never. Pancreatitis: never.   Other: he has never taken insulin; he did not tolerate [place] due to leg swelling; he did not tolerate parlodel due to dizziness; he did not tolerate actos due to edema; after losing 50 lbs in 2014, he needs only the metformin Interval history: no cbg record, but states cbg's are well-controlled.  pt states she feels well in general, except for weight gain.  Past Medical History  Diagnosis Date  . HYPOTHYROIDISM 08/17/2006  . DM 06/12/2008  . HYPERCHOLESTEROLEMIA 05/02/2007  . Gout, unspecified 07/01/2009  . ANEMIA 07/01/2009  . HYPERTENSION 08/17/2006  . FATTY LIVER DISEASE 06/12/2008  . COUGH DUE TO ACE INHIBITORS 09/10/2008  . NASH (nonalcoholic steatohepatitis)   . Mild renal insufficiency     w/u NEG    No past surgical history on file.  Social History   Social History  . Marital Status: Single    Spouse Name: N/A  . Number of Children: N/A  . Years of Education: N/A   Occupational History  . Production manager     for [employer]   Social History Main Topics  . Smoking status: Never Smoker   . Smokeless tobacco: Not on file  . Alcohol Use: Not on file  . Drug Use: Not on file  . Sexual Activity: Not on file   Other Topics Concern  . Not on file   Social History Narrative    Current Outpatient Prescriptions on File Prior to Visit  Medication Sig Dispense Refill  . allopurinol (ZYLOPRIM) 100 MG tablet TAKE 1 TABLET (100 MG TOTAL) BY MOUTH DAILY. 90 tablet 1  . aspirin 81 MG tablet Take 81 mg by mouth daily.      Marland Kitchen atorvastatin (LIPITOR) 10 MG tablet TAKE 1 TABLET (10 MG TOTAL) BY MOUTH DAILY. 90 tablet 2  . Bimatoprost (LUMIGAN) 0.01 % SOLN Apply to eye. 1 drop in right eye at bedtime     . Fish  Oil-Cholecalciferol (FISH OIL + D3 PO) Take by mouth.    Marland Kitchen glucose blood (ONE TOUCH ULTRA TEST) test strip 1 each by Other route daily. And lancets 1/day 250.00 100 each 3  . Lancets (ONETOUCH ULTRASOFT) lancets TEST ONCE DAILY 100 each 2  . levothyroxine (SYNTHROID, LEVOTHROID) 125 MCG tablet TAKE 1 TABLET (125 MCG TOTAL) BY MOUTH DAILY BEFORE BREAKFAST. 90 tablet 0  . losartan-hydrochlorothiazide (HYZAAR) 100-25 MG per tablet TAKE 1 TABLET BY MOUTH DAILY. 30 tablet 0  . metFORMIN (GLUCOPHAGE-XR) 500 MG 24 hr tablet Take 500 mg by mouth daily with breakfast.    . metoprolol (LOPRESSOR) 50 MG tablet TAKE 1 TABLET (50 MG TOTAL) BY MOUTH 2 (TWO) TIMES DAILY. 180 tablet 2  . Multiple Vitamins-Minerals (MULTI COMPLETE PO) Take by mouth.     No current facility-administered medications on file prior to visit.    Allergies  Allergen Reactions  . Penicillins     REACTION: Rash  . Pioglitazone     REACTION: edema  . Tetracycline     REACTION: Rash    Family History  Problem Relation Age of Onset  . Cancer Mother     Breast Cancer  . Cancer Sister     Breast Cancer    BP 138/88 mmHg  Pulse 71  Temp(Src) 99.1 F (37.3 C) (Oral)  Ht  (1.753 m)  Wt 210 lb (95.255 kg)  BMI 31.00 kg/m2  SpO2 95%    Review of Systems Denies edema    Objective:   Physical Exam VITAL SIGNS:  See vs page GENERAL: no distress Pulses: dorsalis pedis intact bilat.   MSK: no deformity of the feet CV: no leg edema Skin:  no ulcer on the feet.  normal color and temp on the feet. Neuro: sensation is intact to touch on the feet.      A1c=5.9%    Assessment & Plan:  DM: well-controlled.  Please continue the same medication  Subjective:   Patient here for Medicare annual wellness visit and management of other chronic and acute problems.     Risk factors: advanced age    Roster of Physicians Providing Medical Care to Patient:  See "snapshot"   Activities of Daily Living: In your present  state of health, do you have any difficulty performing the following activities?:  Preparing food and eating?: No  Bathing yourself: No  Getting dressed: No  Using the toilet:No  Moving around from place to place: No  In the past year have you fallen or had a near fall?: No    Home Safety: Has smoke detector and wears seat belts. No firearms. No excess sun exposure.  Diet and Exercise  Current exercise habits: pt says good Dietary issues discussed: pt reports a healthy diet   Depression Screen  Q1: Over the past two weeks, have you felt down, depressed or hopeless? no  Q2: Over the past two weeks, have you felt little interest or pleasure in doing things? no   The following portions of the patient's history were reviewed and updated as appropriate: allergies, current medications, past family history, past medical history, past social history, past surgical history and problem list.   Review of Systems  Denies hearing loss, and visual loss Objective:   Vision:  Sees opthalmologist Hearing: grossly normal Body mass index:  See vs page Msk: pt easily and quickly performs "get-up-and-go" from a sitting position Cognitive Impairment Assessment: cognition, memory and judgment appear normal.  remembers 1/3 at 5 minutes (? effort).  excellent recall.  can easily read and write a sentence.  alert and oriented x 3.     Assessment:   Medicare wellness utd on preventive parameters    Plan:   During the course of the visit the patient was educated and counseled about appropriate screening and preventive services including:        Fall prevention   Diabetes is well-controlled Nutrition counseling   Vaccines / LABS Zostavax / Pneumococcal Vaccine  today   Patient Instructions (the written plan) was given to the patient.

## 2014-10-01 NOTE — Patient Instructions (Addendum)
Please continue the same medication.  Please come back for a regular physical appointment in 4 months.  good diet and exercise significantly improve the control of your diabetes.  please let me know if you wish to be referred to a dietician.  high blood sugar is very risky to your health.  you should see an eye doctor and dentist every year.  It is very important to get all recommended vaccinations.  please consider these measures for your health:  minimize alcohol.  do not use tobacco products.  have a colonoscopy at least every 10 years from age 21.  keep firearms safely stored.  always use seat belts.  have working smoke alarms in your home.  see an eye doctor and dentist regularly.  never drive under the influence of alcohol or drugs (including prescription drugs).  those with fair skin should take precautions against the sun. Please call to schedule the colonoscopy soon, as it reduces your chances of dying of cancer.

## 2014-10-09 ENCOUNTER — Other Ambulatory Visit: Payer: Self-pay | Admitting: Endocrinology

## 2014-12-03 ENCOUNTER — Other Ambulatory Visit: Payer: Self-pay | Admitting: Endocrinology

## 2014-12-16 ENCOUNTER — Other Ambulatory Visit: Payer: Self-pay | Admitting: Endocrinology

## 2014-12-29 ENCOUNTER — Other Ambulatory Visit: Payer: Self-pay | Admitting: Endocrinology

## 2015-01-19 ENCOUNTER — Other Ambulatory Visit: Payer: Self-pay | Admitting: Endocrinology

## 2015-01-19 ENCOUNTER — Telehealth: Payer: Self-pay | Admitting: Endocrinology

## 2015-01-19 MED ORDER — LOSARTAN POTASSIUM-HCTZ 100-25 MG PO TABS: 1.0000 | ORAL_TABLET | Freq: Every day | ORAL | Status: DC

## 2015-01-19 NOTE — Telephone Encounter (Signed)
Pt needs refills called in to [facility] for the losartan.

## 2015-01-19 NOTE — Telephone Encounter (Signed)
Rx submitted per pt's request.  

## 2015-01-28 ENCOUNTER — Ambulatory Visit: Payer: Commercial Managed Care - HMO | Admitting: Endocrinology

## 2015-02-16 ENCOUNTER — Other Ambulatory Visit: Payer: Self-pay | Admitting: Endocrinology

## 2015-02-18 ENCOUNTER — Encounter: Payer: Commercial Managed Care - HMO | Admitting: Endocrinology

## 2015-03-04 ENCOUNTER — Ambulatory Visit (INDEPENDENT_AMBULATORY_CARE_PROVIDER_SITE_OTHER): Payer: Commercial Managed Care - HMO | Admitting: Endocrinology

## 2015-03-04 ENCOUNTER — Encounter: Payer: Self-pay | Admitting: Endocrinology

## 2015-03-04 VITALS — BP 122/82 | HR 70 | Temp 97.7°F | Ht 69.0 in | Wt 221.0 lb

## 2015-03-04 DIAGNOSIS — N289 Disorder of kidney and ureter, unspecified: Secondary | ICD-10-CM

## 2015-03-04 DIAGNOSIS — Z794 Long term (current) use of insulin: Secondary | ICD-10-CM

## 2015-03-04 DIAGNOSIS — M109 Gout, unspecified: Secondary | ICD-10-CM | POA: Diagnosis not present

## 2015-03-04 DIAGNOSIS — N183 Chronic kidney disease, stage 3 unspecified: Secondary | ICD-10-CM

## 2015-03-04 DIAGNOSIS — E1122 Type 2 diabetes mellitus with diabetic chronic kidney disease: Secondary | ICD-10-CM | POA: Diagnosis not present

## 2015-03-04 DIAGNOSIS — K7581 Nonalcoholic steatohepatitis (NASH): Secondary | ICD-10-CM | POA: Diagnosis not present

## 2015-03-04 DIAGNOSIS — Z0000 Encounter for general adult medical examination without abnormal findings: Secondary | ICD-10-CM

## 2015-03-04 DIAGNOSIS — Z125 Encounter for screening for malignant neoplasm of prostate: Secondary | ICD-10-CM

## 2015-03-04 DIAGNOSIS — Z23 Encounter for immunization: Secondary | ICD-10-CM | POA: Diagnosis not present

## 2015-03-04 DIAGNOSIS — E119 Type 2 diabetes mellitus without complications: Secondary | ICD-10-CM | POA: Insufficient documentation

## 2015-03-04 LAB — MICROALBUMIN / CREATININE URINE RATIO
CREATININE, U: 46.3 mg/dL
MICROALB UR: 1 mg/dL (ref 0.0–1.9)
Microalb Creat Ratio: 2.2 mg/g (ref 0.0–30.0)

## 2015-03-04 LAB — CBC WITH DIFFERENTIAL/PLATELET
Basophils Absolute: 0.1 10*3/uL (ref 0.0–0.1)
Basophils Relative: 0.4 % (ref 0.0–3.0)
EOS ABS: 0.2 10*3/uL (ref 0.0–0.7)
Eosinophils Relative: 1.6 % (ref 0.0–5.0)
HCT: 47.8 % (ref 39.0–52.0)
Hemoglobin: 16.2 g/dL (ref 13.0–17.0)
LYMPHS ABS: 4.5 10*3/uL — AB (ref 0.7–4.0)
Lymphocytes Relative: 37.7 % (ref 12.0–46.0)
MCHC: 34 g/dL (ref 30.0–36.0)
MCV: 87.6 fl (ref 78.0–100.0)
MONO ABS: 0.7 10*3/uL (ref 0.1–1.0)
Monocytes Relative: 6.1 % (ref 3.0–12.0)
NEUTROS ABS: 6.5 10*3/uL (ref 1.4–7.7)
NEUTROS PCT: 54.2 % (ref 43.0–77.0)
PLATELETS: 298 10*3/uL (ref 150.0–400.0)
RBC: 5.45 Mil/uL (ref 4.22–5.81)
RDW: 14 % (ref 11.5–15.5)
WBC: 12.1 10*3/uL — ABNORMAL HIGH (ref 4.0–10.5)

## 2015-03-04 LAB — HEPATIC FUNCTION PANEL
ALK PHOS: 61 U/L (ref 39–117)
ALT: 19 U/L (ref 0–53)
AST: 21 U/L (ref 0–37)
Albumin: 4.6 g/dL (ref 3.5–5.2)
BILIRUBIN DIRECT: 0.1 mg/dL (ref 0.0–0.3)
TOTAL PROTEIN: 8.1 g/dL (ref 6.0–8.3)
Total Bilirubin: 0.9 mg/dL (ref 0.2–1.2)

## 2015-03-04 LAB — BASIC METABOLIC PANEL
BUN: 21 mg/dL (ref 6–23)
CALCIUM: 10.1 mg/dL (ref 8.4–10.5)
CO2: 31 mEq/L (ref 19–32)
Chloride: 95 mEq/L — ABNORMAL LOW (ref 96–112)
Creatinine, Ser: 1.26 mg/dL (ref 0.40–1.50)
GFR: 60.53 mL/min (ref >60.00)
Glucose, Bld: 149 mg/dL — ABNORMAL HIGH (ref 70–99)
POTASSIUM: 3.8 meq/L (ref 3.5–5.1)
SODIUM: 135 meq/L (ref 135–145)

## 2015-03-04 LAB — URINALYSIS, ROUTINE W REFLEX MICROSCOPIC
BILIRUBIN URINE: NEGATIVE
HGB URINE DIPSTICK: NEGATIVE
Ketones, ur: NEGATIVE
LEUKOCYTES UA: NEGATIVE
Nitrite: NEGATIVE
RBC / HPF: NONE SEEN (ref 0–?)
Specific Gravity, Urine: <=1.005 — AB (ref 1.000–1.030)
TOTAL PROTEIN, URINE-UPE24: NEGATIVE
URINE GLUCOSE: NEGATIVE
UROBILINOGEN UA: 0.2 (ref 0.0–1.0)
WBC, UA: NONE SEEN (ref 0–?)
pH: 6 (ref 5.0–8.0)

## 2015-03-04 LAB — LIPID PANEL
Cholesterol: 176 mg/dL (ref 0–200)
HDL: 44.3 mg/dL (ref >39.00)
NONHDL: 131.79
Total CHOL/HDL Ratio: 4
Triglycerides: 251 mg/dL — ABNORMAL HIGH (ref 0.0–149.0)
VLDL: 50.2 mg/dL — ABNORMAL HIGH (ref 0.0–40.0)

## 2015-03-04 LAB — PSA: PSA: 1.19 ng/mL (ref 0.10–4.00)

## 2015-03-04 LAB — URIC ACID: Uric Acid, Serum: 4.6 mg/dL (ref 4.0–7.8)

## 2015-03-04 LAB — LDL CHOLESTEROL, DIRECT: Direct LDL: 90 mg/dL

## 2015-03-04 LAB — POCT GLYCOSYLATED HEMOGLOBIN (HGB A1C): Hemoglobin A1C: 6.3

## 2015-03-04 LAB — TSH: TSH: 3.84 u[IU]/mL (ref 0.35–4.50)

## 2015-03-04 NOTE — Progress Notes (Signed)
Subjective:    Patient ID: Paul Banks, male    DOB: 1945-09-27, 70 y.o.   MRN: [id number]  HPI pt states DM was dx'ed in; he has mild if any neuropathy of the lower extremities; he is unaware of any associated chronic complications; he has been on insulin since; pt says his diet and exercise are; he has never had GDM, pancreatitis, severe hypoglycemia or DKA. Past Medical History  Diagnosis Date  . HYPOTHYROIDISM 08/17/2006  . DM 06/12/2008  . HYPERCHOLESTEROLEMIA 05/02/2007  . Gout, unspecified 07/01/2009  . ANEMIA 07/01/2009  . HYPERTENSION 08/17/2006  . FATTY LIVER DISEASE 06/12/2008  . COUGH DUE TO ACE INHIBITORS 09/10/2008  . NASH (nonalcoholic steatohepatitis)   . Mild renal insufficiency     w/u NEG    No past surgical history on file.  Social History   Social History  . Marital Status: Single    Spouse Name: N/A  . Number of Children: N/A  . Years of Education: N/A   Occupational History  . Production manager     for [employer]   Social History Main Topics  . Smoking status: Never Smoker   . Smokeless tobacco: Not on file  . Alcohol Use: Not on file  . Drug Use: Not on file  . Sexual Activity: Not on file   Other Topics Concern  . Not on file   Social History Narrative    Current Outpatient Prescriptions on File Prior to Visit  Medication Sig Dispense Refill  . allopurinol (ZYLOPRIM) 100 MG tablet TAKE 1 TABLET (100 MG TOTAL) BY MOUTH DAILY. 90 tablet 0  . aspirin 81 MG tablet Take 81 mg by mouth daily.      Marland Kitchen atorvastatin (LIPITOR) 10 MG tablet TAKE 1 TABLET (10 MG TOTAL) BY MOUTH DAILY. 90 tablet 1  . Bimatoprost (LUMIGAN) 0.01 % SOLN Apply to eye. 1 drop in right eye at bedtime     . Fish Oil-Cholecalciferol (FISH OIL + D3 PO) Take by mouth.    Marland Kitchen glucose blood (ONE TOUCH ULTRA TEST) test strip 1 each by Other route daily. And lancets 1/day 250.00 100 each 3  . Lancets (ONETOUCH ULTRASOFT) lancets TEST ONCE DAILY 100 each 2  . levothyroxine (SYNTHROID,  LEVOTHROID) 125 MCG tablet TAKE 1 TABLET (125 MCG TOTAL) BY MOUTH DAILY BEFORE BREAKFAST. 90 tablet 0  . losartan-hydrochlorothiazide (HYZAAR) 100-25 MG tablet TAKE 1 TABLET BY MOUTH DAILY. 90 tablet 2  . metFORMIN (GLUCOPHAGE-XR) 500 MG 24 hr tablet Take 500 mg by mouth daily with breakfast.    . metoprolol (LOPRESSOR) 50 MG tablet TAKE 1 TABLET BY MOUTH 2 TIMES A DAY. 180 tablet 1  . Multiple Vitamins-Minerals (MULTI COMPLETE PO) Take by mouth.     No current facility-administered medications on file prior to visit.    Allergies  Allergen Reactions  . Penicillins     REACTION: Rash  . Pioglitazone     REACTION: edema  . Tetracycline     REACTION: Rash    Family History  Problem Relation Age of Onset  . Cancer Mother     Breast Cancer  . Cancer Sister     Breast Cancer    BP 122/82 mmHg  Pulse 70  Temp(Src) 97.7 F (36.5 C) (Oral)  Ht  (1.753 m)  Wt 221 lb (100.245 kg)  BMI 32.62 kg/m2  SpO2 97%  Review of Systems  Constitutional: Negative for fever.  HENT: Negative for hearing loss.   Eyes: Negative  for visual disturbance.  Respiratory: Negative for shortness of breath.   Cardiovascular: Negative for chest pain.  Gastrointestinal: Negative for anal bleeding.  Endocrine: Negative for cold intolerance.  Genitourinary: Negative for hematuria and difficulty urinating.  Musculoskeletal: Negative for back pain.  Skin: Negative for rash.  Allergic/Immunologic: Negative for environmental allergies.  Neurological: Negative for syncope, numbness and headaches.  Hematological: Does not bruise/bleed easily.  Psychiatric/Behavioral: Negative for dysphoric mood.       Objective:   Physical Exam VS: see vs page GEN: no distress HEAD: head: no deformity eyes: no periorbital swelling, no proptosis external nose and ears are normal mouth: no lesion seen NECK: supple, thyroid is not enlarged CHEST WALL: no deformity LUNGS: clear to auscultation BREASTS:  No  gynecomastia CV: reg rate and rhythm, no murmur ABD: abdomen is soft, nontender.  no hepatosplenomegaly.  not distended.  no hernia. RECTAL: normal external and internal exam.  heme neg. PROSTATE:  Normal size.  No nodule MUSCULOSKELETAL: muscle bulk and strength are grossly normal.  no obvious joint swelling.  gait is normal and steady EXTEMITIES: no deformity.  no ulcer on the feet.  feet are of normal color and temp.  no edema PULSES: dorsalis pedis intact bilat.  no carotid bruit NEURO:  cn 2-12 grossly intact.   readily moves all 4's.  sensation is intact to touch on the feet SKIN:  Normal texture and temperature.  No rash or suspicious lesion is visible.   NODES:  None palpable at the neck PSYCH: alert, well-oriented.  Does not appear anxious nor depressed.  i personally reviewed electrocardiogram tracing (today): Indication: DM Impression: normal     Assessment & Plan:  Wellness visit today, with problems stable, except as noted.  Patient is advised the following: Patient Instructions  Please continue the same medication.   blood tests are requested for you today.  We'll let you know about the results.  Please come back for a follow-up appointment in 4 months.  good diet and exercise significantly improve the control of your diabetes.  please let me know if you wish to be referred to a dietician.  high blood sugar is very risky to your health.  you should see an eye doctor and dentist every year.  It is very important to get all recommended vaccinations.  please consider these measures for your health:  minimize alcohol.  do not use tobacco products.  have a colonoscopy at least every 10 years from age 31.  keep firearms safely stored.  always use seat belts.  have working smoke alarms in your home.  see an eye doctor and dentist regularly.  never drive under the influence of alcohol or drugs (including prescription drugs).  those with fair skin should take precautions against the  sun.   Please call to schedule the colonoscopy soon, as it reduces your chances of dying of cancer.

## 2015-03-04 NOTE — Patient Instructions (Signed)
Please continue the same medication.   blood tests are requested for you today.  We'll let you know about the results.  Please come back for a follow-up appointment in 4 months.  good diet and exercise significantly improve the control of your diabetes.  please let me know if you wish to be referred to a dietician.  high blood sugar is very risky to your health.  you should see an eye doctor and dentist every year.  It is very important to get all recommended vaccinations.  please consider these measures for your health:  minimize alcohol.  do not use tobacco products.  have a colonoscopy at least every 10 years from age 67.  keep firearms safely stored.  always use seat belts.  have working smoke alarms in your home.  see an eye doctor and dentist regularly.  never drive under the influence of alcohol or drugs (including prescription drugs).  those with fair skin should take precautions against the sun.   Please call to schedule the colonoscopy soon, as it reduces your chances of dying of cancer.

## 2015-04-01 ENCOUNTER — Other Ambulatory Visit: Payer: Self-pay | Admitting: Endocrinology

## 2015-04-13 ENCOUNTER — Other Ambulatory Visit: Payer: Self-pay | Admitting: Endocrinology

## 2015-05-05 ENCOUNTER — Other Ambulatory Visit: Payer: Self-pay | Admitting: Endocrinology

## 2015-05-06 ENCOUNTER — Other Ambulatory Visit: Payer: Self-pay | Admitting: Endocrinology

## 2015-05-07 ENCOUNTER — Other Ambulatory Visit: Payer: Self-pay | Admitting: Endocrinology

## 2015-06-24 ENCOUNTER — Ambulatory Visit: Payer: Commercial Managed Care - HMO | Admitting: Endocrinology

## 2015-06-26 ENCOUNTER — Other Ambulatory Visit: Payer: Self-pay | Admitting: Endocrinology

## 2015-07-08 ENCOUNTER — Other Ambulatory Visit: Payer: Self-pay | Admitting: Endocrinology

## 2015-07-29 ENCOUNTER — Ambulatory Visit (INDEPENDENT_AMBULATORY_CARE_PROVIDER_SITE_OTHER): Payer: Commercial Managed Care - HMO | Admitting: Endocrinology

## 2015-07-29 VITALS — BP 170/90 | HR 77 | Wt 222.2 lb

## 2015-07-29 DIAGNOSIS — I10 Essential (primary) hypertension: Secondary | ICD-10-CM | POA: Diagnosis not present

## 2015-07-29 DIAGNOSIS — E139 Other specified diabetes mellitus without complications: Secondary | ICD-10-CM | POA: Diagnosis not present

## 2015-07-29 LAB — POCT GLYCOSYLATED HEMOGLOBIN (HGB A1C): HEMOGLOBIN A1C: 6.8

## 2015-07-29 MED ORDER — METFORMIN HCL ER 500 MG PO TB24: ORAL_TABLET | ORAL | Status: DC

## 2015-07-29 NOTE — Progress Notes (Signed)
Subjective:    Patient ID: Paul Banks, male    DOB: 1945-01-25, 70 y.o.   MRN: [id number]  HPI Pt returns for f/u of diabetes mellitus: DM type: 2 Dx'ed: 2008 Complications: none Therapy: metformin DKA: never Severe hypoglycemia: never. Pancreatitis: never.   Other: he has never taken insulin; he did not tolerate [place] due to leg swelling; he did not tolerate parlodel due to dizziness; he did not tolerate actos due to edema; after losing 50 lbs in 2014, he needs only the metformin.   Interval history: pt states he feels well in general.  He takes meds as rx'ed. Past Medical History  Diagnosis Date  . HYPOTHYROIDISM 08/17/2006  . DM 06/12/2008  . HYPERCHOLESTEROLEMIA 05/02/2007  . Gout, unspecified 07/01/2009  . ANEMIA 07/01/2009  . HYPERTENSION 08/17/2006  . FATTY LIVER DISEASE 06/12/2008  . COUGH DUE TO ACE INHIBITORS 09/10/2008  . NASH (nonalcoholic steatohepatitis)   . Mild renal insufficiency     w/u NEG    No past surgical history on file.  Social History   Social History  . Marital Status: Single    Spouse Name: N/A  . Number of Children: N/A  . Years of Education: N/A   Occupational History  . Production managerBuyer     for [employer]   Social History Main Topics  . Smoking status: Never Smoker   . Smokeless tobacco: Not on file  . Alcohol Use: Not on file  . Drug Use: Not on file  . Sexual Activity: Not on file   Other Topics Concern  . Not on file   Social History Narrative    Current Outpatient Prescriptions on File Prior to Visit  Medication Sig Dispense Refill  . allopurinol (ZYLOPRIM) 100 MG tablet TAKE 1 TABLET (100 MG TOTAL) BY MOUTH DAILY. 90 tablet 2  . aspirin 81 MG tablet Take 81 mg by mouth daily.      Marland Kitchen. atorvastatin (LIPITOR) 10 MG tablet TAKE 1 TABLET (10 MG TOTAL) BY MOUTH DAILY. 90 tablet 0  . Bimatoprost (LUMIGAN) 0.01 % SOLN Apply to eye. 1 drop in right eye at bedtime     . Fish Oil-Cholecalciferol (FISH OIL + D3 PO) Take by mouth.    Marland Kitchen.  glucose blood (ONE TOUCH ULTRA TEST) test strip 1 each by Other route daily. And lancets 1/day 250.00 100 each 3  . Lancets (ONETOUCH ULTRASOFT) lancets TEST ONCE DAILY 100 each 0  . levothyroxine (SYNTHROID, LEVOTHROID) 125 MCG tablet TAKE 1 TABLET (125 MCG TOTAL) BY MOUTH DAILY BEFORE BREAKFAST. 90 tablet 0  . losartan-hydrochlorothiazide (HYZAAR) 100-25 MG tablet TAKE 1 TABLET BY MOUTH DAILY. 90 tablet 2  . metoprolol (LOPRESSOR) 50 MG tablet TAKE 1 TABLET BY MOUTH 2 TIMES A DAY. 180 tablet 0  . Multiple Vitamins-Minerals (MULTI COMPLETE PO) Take by mouth.     No current facility-administered medications on file prior to visit.    Allergies  Allergen Reactions  . Penicillins     REACTION: Rash  . Pioglitazone     REACTION: edema  . Tetracycline     REACTION: Rash    Family History  Problem Relation Age of Onset  . Cancer Mother     Breast Cancer  . Cancer Sister     Breast Cancer    BP 170/90 mmHg  Pulse 77  Wt 222 lb 3.2 oz (100.789 kg)  SpO2 93%  Review of Systems No weight change    Objective:   Physical Exam VITAL  SIGNS:  See vs page GENERAL: no distress Pulses: dorsalis pedis intact bilat.   MSK: no deformity of the feet CV: no leg edema Skin:  no ulcer on the feet.  normal color and temp on the feet. Neuro: sensation is intact to touch on the feet.   Lab Results  Component Value Date   HGBA1C 6.8 07/29/2015   Lab Results  Component Value Date   CREATININE 1.26 03/04/2015   BUN 21 03/04/2015   NA 135 03/04/2015   K 3.8 03/04/2015   CL 95* 03/04/2015   CO2 31 03/04/2015      Assessment & Plan:  Type 2 DM: Needs increased rx, if it can be done with a regimen that avoids or minimizes hypoglycemia. HTN: worse: pt says this is situational  Patient is advised the following: Patient Instructions  Please increase the metformin to 4 pills per day.   Please come back for a follow-up appointment in 4 months, when we'll recheck the blood pressure.      Romero Belling, MD

## 2015-07-29 NOTE — Patient Instructions (Addendum)
Please increase the metformin to 4 pills per day.   Please come back for a follow-up appointment in 4 months, when we'll recheck the blood pressure.

## 2015-08-05 DIAGNOSIS — H401131 Primary open-angle glaucoma, bilateral, mild stage: Secondary | ICD-10-CM | POA: Diagnosis not present

## 2015-08-05 DIAGNOSIS — H25813 Combined forms of age-related cataract, bilateral: Secondary | ICD-10-CM | POA: Diagnosis not present

## 2015-08-05 DIAGNOSIS — H524 Presbyopia: Secondary | ICD-10-CM | POA: Diagnosis not present

## 2015-09-29 ENCOUNTER — Other Ambulatory Visit: Payer: Self-pay | Admitting: Endocrinology

## 2015-10-02 ENCOUNTER — Other Ambulatory Visit: Payer: Self-pay | Admitting: Endocrinology

## 2015-11-02 ENCOUNTER — Other Ambulatory Visit: Payer: Self-pay | Admitting: Endocrinology

## 2015-11-09 ENCOUNTER — Other Ambulatory Visit: Payer: Self-pay | Admitting: Endocrinology

## 2015-11-25 ENCOUNTER — Ambulatory Visit: Payer: Commercial Managed Care - HMO | Admitting: Endocrinology

## 2015-12-06 NOTE — Progress Notes (Signed)
Subjective:    Patient ID: Paul Banks, male    DOB: Jan 01, 1946, 70 y.o.   MRN: [id number]  HPI Pt is here for regular wellness examination, and is feeling pretty well in general, and says chronic med probs are stable, except as noted below.  Past Medical History:  Diagnosis Date  . ANEMIA 07/01/2009  . COUGH DUE TO ACE INHIBITORS 09/10/2008  . DM 06/12/2008  . FATTY LIVER DISEASE 06/12/2008  . Gout, unspecified 07/01/2009  . HYPERCHOLESTEROLEMIA 05/02/2007  . HYPERTENSION 08/17/2006  . HYPOTHYROIDISM 08/17/2006  . Mild renal insufficiency    w/u NEG  . NASH (nonalcoholic steatohepatitis)     No past surgical history on file.  Social History   Social History  . Marital status: Single    Spouse name: N/A  . Number of children: N/A  . Years of education: N/A   Occupational History  . Production managerBuyer     for [employer]   Social History Main Topics  . Smoking status: Never Smoker  . Smokeless tobacco: Not on file  . Alcohol use Not on file  . Drug use: Unknown  . Sexual activity: Not on file   Other Topics Concern  . Not on file   Social History Narrative  . No narrative on file    Current Outpatient Prescriptions on File Prior to Visit  Medication Sig Dispense Refill  . allopurinol (ZYLOPRIM) 100 MG tablet TAKE 1 TABLET (100 MG TOTAL) BY MOUTH DAILY. 30 tablet 1  . aspirin 81 MG tablet Take 81 mg by mouth daily.      Marland Kitchen. atorvastatin (LIPITOR) 10 MG tablet TAKE ONE TABLET BY MOUTH DAILY 90 tablet 0  . Bimatoprost (LUMIGAN) 0.01 % SOLN Apply to eye. 1 drop in right eye at bedtime     . Fish Oil-Cholecalciferol (FISH OIL + D3 PO) Take by mouth.    Marland Kitchen. glucose blood (ONE TOUCH ULTRA TEST) test strip 1 each by Other route daily. And lancets 1/day 250.00 100 each 3  . Lancets (ONETOUCH ULTRASOFT) lancets TEST ONCE DAILY 100 each 0  . levothyroxine (SYNTHROID, LEVOTHROID) 125 MCG tablet TAKE ONE TABLET BY MOUTH EVERY MORNING BEFORE BREAKFAST 90 tablet 0  .  losartan-hydrochlorothiazide (HYZAAR) 100-25 MG tablet TAKE 1 TABLET BY MOUTH DAILY. 90 tablet 1  . metoprolol (LOPRESSOR) 50 MG tablet TAKE ONE TABLET BY MOUTH TWICE A DAY 180 tablet 0  . Multiple Vitamins-Minerals (MULTI COMPLETE PO) Take by mouth.     No current facility-administered medications on file prior to visit.     Allergies  Allergen Reactions  . Penicillins     REACTION: Rash  . Pioglitazone     REACTION: edema  . Tetracycline     REACTION: Rash    Family History  Problem Relation Age of Onset  . Cancer Mother     Breast Cancer  . Cancer Sister     Breast Cancer    BP (!) 164/94   Pulse 74   Ht 5\' 9"  (1.753 m)   Wt 223 lb (101.2 kg)   SpO2 96%   BMI 32.93 kg/m     Review of Systems  Constitutional: Negative for fever.       Pt reports weight gain  HENT: Negative for hearing loss.   Eyes: Negative for visual disturbance.  Respiratory: Negative for shortness of breath.   Cardiovascular: Negative for chest pain and leg swelling.  Gastrointestinal: Negative for anal bleeding.  Endocrine: Negative for cold intolerance.  Genitourinary: Negative for difficulty urinating and hematuria.  Musculoskeletal: Negative for back pain.  Skin: Negative for rash.  Allergic/Immunologic: Negative for environmental allergies.  Neurological: Negative for syncope and numbness.  Psychiatric/Behavioral: Negative for dysphoric mood.      Objective:   Physical Exam VS: see vs page GEN: no distress HEAD: head: no deformity eyes: no periorbital swelling, no proptosis external nose and ears are normal mouth: no lesion seen NECK: supple, thyroid is not enlarged CHEST WALL: no deformity LUNGS: clear to auscultation BREASTS:  No gynecomastia CV: reg rate and rhythm, no murmur.   ABD: abdomen is soft, nontender.  no hepatosplenomegaly.  not distended.  no hernia.   RECTAL: normal external and internal exam.  heme neg. PROSTATE:  Normal size.  No nodule MUSCULOSKELETAL:  muscle bulk and strength are grossly normal.  no obvious joint swelling.  gait is normal and steady.   EXTEMITIES: no deformity.  no ulcer on the feet.  feet are of normal color and temp.  no edema PULSES: dorsalis pedis intact bilat.  no carotid bruit NEURO:  cn 2-12 grossly intact.   readily moves all 4's.  sensation is intact to touch on the feet. SKIN:  Normal texture and temperature.  No rash or suspicious lesion is visible.   NODES:  None palpable at the neck PSYCH: alert, well-oriented.  Does not appear anxious nor depressed.     Assessment & Plan:  Wellness visit today, with problems stable, except as noted.  Patient is advised the following: Patient Instructions  Please consider these measures for your health:  minimize alcohol.  Do not use tobacco products.  Have a colonoscopy at least every 10 years from age 68.  Women should have an annual mammogram from age 68.  Keep firearms safely stored.  Always use seat belts.  have working smoke alarms in your home.  See an eye doctor and dentist regularly.  Never drive under the influence of alcohol or drugs (including prescription drugs).  Those with fair skin should take precautions against the sun, and should carefully examine their skin once per month, for any new or changed moles. Please continue the same medications. Please come back for a follow-up appointment in 6 months.

## 2015-12-09 ENCOUNTER — Ambulatory Visit (INDEPENDENT_AMBULATORY_CARE_PROVIDER_SITE_OTHER): Payer: Commercial Managed Care - HMO | Admitting: Endocrinology

## 2015-12-09 ENCOUNTER — Encounter: Payer: Self-pay | Admitting: Endocrinology

## 2015-12-09 VITALS — BP 164/94 | HR 74 | Ht 69.0 in | Wt 223.0 lb

## 2015-12-09 DIAGNOSIS — N183 Chronic kidney disease, stage 3 (moderate): Secondary | ICD-10-CM | POA: Diagnosis not present

## 2015-12-09 DIAGNOSIS — E1122 Type 2 diabetes mellitus with diabetic chronic kidney disease: Secondary | ICD-10-CM | POA: Diagnosis not present

## 2015-12-09 LAB — POCT GLYCOSYLATED HEMOGLOBIN (HGB A1C): HEMOGLOBIN A1C: 6.6

## 2015-12-09 MED ORDER — METFORMIN HCL ER 500 MG PO TB24: 500.0000 mg | ORAL_TABLET | Freq: Two times a day (BID) | ORAL | 3 refills | Status: DC

## 2015-12-09 NOTE — Patient Instructions (Addendum)
Please consider these measures for your health:  minimize alcohol.  Do not use tobacco products.  Have a colonoscopy at least every 10 years from age 68.  Women should have an annual mammogram from age 40.  Keep firearms safely stored.  Always use seat belts.  have working smoke alarms in your home.  See an eye doctor and dentist regularly.  Never drive under the influence of alcohol or drugs (including prescription drugs).  Those with fair skin should take precautions against the sun, and should carefully examine their skin once per month, for any new or changed moles. Please continue the same medications.  Please come back for a follow-up appointment in 6 months.  

## 2015-12-25 ENCOUNTER — Other Ambulatory Visit: Payer: Self-pay | Admitting: Endocrinology

## 2016-01-01 ENCOUNTER — Other Ambulatory Visit: Payer: Self-pay | Admitting: Endocrinology

## 2016-01-22 ENCOUNTER — Other Ambulatory Visit: Payer: Self-pay | Admitting: Endocrinology

## 2016-01-27 ENCOUNTER — Other Ambulatory Visit: Payer: Self-pay | Admitting: Endocrinology

## 2016-02-19 ENCOUNTER — Other Ambulatory Visit: Payer: Self-pay | Admitting: Endocrinology

## 2016-03-18 ENCOUNTER — Other Ambulatory Visit: Payer: Self-pay | Admitting: Endocrinology

## 2016-03-22 ENCOUNTER — Other Ambulatory Visit: Payer: Self-pay | Admitting: Endocrinology

## 2016-03-28 ENCOUNTER — Other Ambulatory Visit: Payer: Self-pay | Admitting: Endocrinology

## 2016-03-30 ENCOUNTER — Other Ambulatory Visit: Payer: Self-pay | Admitting: Endocrinology

## 2016-04-18 ENCOUNTER — Other Ambulatory Visit: Payer: Self-pay | Admitting: Endocrinology

## 2016-04-19 ENCOUNTER — Other Ambulatory Visit: Payer: Self-pay

## 2016-04-19 MED ORDER — ALLOPURINOL 100 MG PO TABS: 100.0000 mg | ORAL_TABLET | Freq: Every day | ORAL | 1 refills | Status: DC

## 2016-04-28 ENCOUNTER — Telehealth: Payer: Self-pay | Admitting: Endocrinology

## 2016-04-28 ENCOUNTER — Other Ambulatory Visit: Payer: Self-pay | Admitting: Endocrinology

## 2016-04-28 MED ORDER — LEVOTHYROXINE SODIUM 125 MCG PO TABS: 125.0000 ug | ORAL_TABLET | Freq: Every day | ORAL | 0 refills | Status: DC

## 2016-04-28 NOTE — Telephone Encounter (Signed)
Pharmacy notified ok to change brand.

## 2016-04-28 NOTE — Telephone Encounter (Signed)
levothyroxine (SYNTHROID, LEVOTHROID) 125 MCG tablet generic has changed for stock at the pharmacy. Only have lannett brand, patient used to take mylen brand. Patient was okay with the change.   [facility] [address] [place] - [address] [phone] (Phone) [phone] (Fax)

## 2016-05-19 DIAGNOSIS — H524 Presbyopia: Secondary | ICD-10-CM | POA: Diagnosis not present

## 2016-05-19 DIAGNOSIS — H401131 Primary open-angle glaucoma, bilateral, mild stage: Secondary | ICD-10-CM | POA: Diagnosis not present

## 2016-05-19 DIAGNOSIS — H25813 Combined forms of age-related cataract, bilateral: Secondary | ICD-10-CM | POA: Diagnosis not present

## 2016-06-08 ENCOUNTER — Ambulatory Visit: Payer: Commercial Managed Care - HMO | Admitting: Endocrinology

## 2016-06-17 ENCOUNTER — Other Ambulatory Visit: Payer: Self-pay | Admitting: Endocrinology

## 2016-06-22 ENCOUNTER — Other Ambulatory Visit: Payer: Self-pay | Admitting: Endocrinology

## 2016-06-29 ENCOUNTER — Other Ambulatory Visit: Payer: Self-pay | Admitting: Endocrinology

## 2016-07-06 ENCOUNTER — Ambulatory Visit (INDEPENDENT_AMBULATORY_CARE_PROVIDER_SITE_OTHER): Payer: Medicare HMO | Admitting: Endocrinology

## 2016-07-06 ENCOUNTER — Telehealth: Payer: Self-pay | Admitting: Endocrinology

## 2016-07-06 ENCOUNTER — Encounter: Payer: Self-pay | Admitting: Endocrinology

## 2016-07-06 VITALS — BP 144/84 | HR 89 | Ht 69.0 in | Wt 223.0 lb

## 2016-07-06 DIAGNOSIS — M1A9XX0 Chronic gout, unspecified, without tophus (tophi): Secondary | ICD-10-CM

## 2016-07-06 DIAGNOSIS — N183 Chronic kidney disease, stage 3 unspecified: Secondary | ICD-10-CM

## 2016-07-06 DIAGNOSIS — E1122 Type 2 diabetes mellitus with diabetic chronic kidney disease: Secondary | ICD-10-CM | POA: Diagnosis not present

## 2016-07-06 DIAGNOSIS — Z0000 Encounter for general adult medical examination without abnormal findings: Secondary | ICD-10-CM

## 2016-07-06 DIAGNOSIS — Z125 Encounter for screening for malignant neoplasm of prostate: Secondary | ICD-10-CM | POA: Diagnosis not present

## 2016-07-06 LAB — HEPATIC FUNCTION PANEL
ALBUMIN: 4.6 g/dL (ref 3.5–5.2)
ALK PHOS: 65 U/L (ref 39–117)
ALT: 18 U/L (ref 0–53)
AST: 16 U/L (ref 0–37)
Bilirubin, Direct: 0.1 mg/dL (ref 0.0–0.3)
TOTAL PROTEIN: 7.7 g/dL (ref 6.0–8.3)
Total Bilirubin: 0.8 mg/dL (ref 0.2–1.2)

## 2016-07-06 LAB — POCT GLYCOSYLATED HEMOGLOBIN (HGB A1C): Hemoglobin A1C: 7.5

## 2016-07-06 LAB — BASIC METABOLIC PANEL
BUN: 22 mg/dL (ref 6–23)
CALCIUM: 10.2 mg/dL (ref 8.4–10.5)
CO2: 32 meq/L (ref 19–32)
Chloride: 100 mEq/L (ref 96–112)
Creatinine, Ser: 1.19 mg/dL (ref 0.40–1.50)
GFR: 64.4 mL/min (ref >60.00)
GLUCOSE: 162 mg/dL — AB (ref 70–99)
Potassium: 3.7 mEq/L (ref 3.5–5.1)
SODIUM: 140 meq/L (ref 135–145)

## 2016-07-06 LAB — CBC WITH DIFFERENTIAL/PLATELET
BASOS PCT: 0.7 % (ref 0.0–3.0)
Basophils Absolute: 0.1 10*3/uL (ref 0.0–0.1)
EOS PCT: 1.7 % (ref 0.0–5.0)
Eosinophils Absolute: 0.2 10*3/uL (ref 0.0–0.7)
HEMATOCRIT: 44.3 % (ref 39.0–52.0)
HEMOGLOBIN: 15.4 g/dL (ref 13.0–17.0)
LYMPHS PCT: 32.4 % (ref 12.0–46.0)
Lymphs Abs: 3.6 10*3/uL (ref 0.7–4.0)
MCHC: 34.7 g/dL (ref 30.0–36.0)
MCV: 88.4 fl (ref 78.0–100.0)
Monocytes Absolute: 0.6 10*3/uL (ref 0.1–1.0)
Monocytes Relative: 5.6 % (ref 3.0–12.0)
NEUTROS ABS: 6.6 10*3/uL (ref 1.4–7.7)
Neutrophils Relative %: 59.6 % (ref 43.0–77.0)
PLATELETS: 255 10*3/uL (ref 150.0–400.0)
RBC: 5.01 Mil/uL (ref 4.22–5.81)
RDW: 14.5 % (ref 11.5–15.5)
WBC: 11 10*3/uL — AB (ref 4.0–10.5)

## 2016-07-06 LAB — URINALYSIS, ROUTINE W REFLEX MICROSCOPIC
BILIRUBIN URINE: NEGATIVE
Hgb urine dipstick: NEGATIVE
Ketones, ur: NEGATIVE
Leukocytes, UA: NEGATIVE
Nitrite: NEGATIVE
PH: 5.5 (ref 5.0–8.0)
RBC / HPF: NONE SEEN (ref 0–?)
SPECIFIC GRAVITY, URINE: 1.02 (ref 1.000–1.030)
TOTAL PROTEIN, URINE-UPE24: NEGATIVE
Urine Glucose: NEGATIVE
Urobilinogen, UA: 0.2 (ref 0.0–1.0)
WBC UA: NONE SEEN (ref 0–?)

## 2016-07-06 LAB — MICROALBUMIN / CREATININE URINE RATIO
Creatinine,U: 117.4 mg/dL
Microalb Creat Ratio: 2.9 mg/g (ref 0.0–30.0)
Microalb, Ur: 3.4 mg/dL — ABNORMAL HIGH (ref 0.0–1.9)

## 2016-07-06 LAB — PSA: PSA: 0.86 ng/mL (ref 0.10–4.00)

## 2016-07-06 LAB — LIPID PANEL
CHOL/HDL RATIO: 4
Cholesterol: 174 mg/dL (ref 0–200)
HDL: 39 mg/dL — AB (ref >39.00)
NONHDL: 134.66
TRIGLYCERIDES: 340 mg/dL — AB (ref 0.0–149.0)
VLDL: 68 mg/dL — AB (ref 0.0–40.0)

## 2016-07-06 LAB — TSH: TSH: 4.22 u[IU]/mL (ref 0.35–4.50)

## 2016-07-06 LAB — LDL CHOLESTEROL, DIRECT: LDL DIRECT: 86 mg/dL

## 2016-07-06 LAB — URIC ACID: Uric Acid, Serum: 3.8 mg/dL — ABNORMAL LOW (ref 4.0–7.8)

## 2016-07-06 MED ORDER — AMLODIPINE BESYLATE 2.5 MG PO TABS: 2.5000 mg | ORAL_TABLET | Freq: Every day | ORAL | 3 refills | Status: DC

## 2016-07-06 MED ORDER — GLUCOSE BLOOD VI STRP: 1.0000 | ORAL_STRIP | Freq: Every day | 3 refills | Status: DC

## 2016-07-06 MED ORDER — METFORMIN HCL ER 500 MG PO TB24: 2000.0000 mg | ORAL_TABLET | Freq: Every day | ORAL | 3 refills | Status: DC

## 2016-07-06 NOTE — Progress Notes (Signed)
we discussed code status.  pt requests full code, but would not want to be started or maintained on artificial life-support measures if there was not a reasonable chance of recovery 

## 2016-07-06 NOTE — Progress Notes (Signed)
Subjective:    Patient ID: Paul Banks, male    DOB: Sep 26, 1945, 71 y.o.   MRN: [id number]  HPI Pt returns for f/u of diabetes mellitus:  DM type: 2 Dx'ed: 2008 Complications: none Therapy: metformin DKA: never Severe hypoglycemia: never.  Pancreatitis: never Pancreatic imaging: never (could not see on 2008 [place]) Other: he has never been on isnulin Interval history: denies sob.  Past Medical History:  Diagnosis Date  . ANEMIA 07/01/2009  . COUGH DUE TO ACE INHIBITORS 09/10/2008  . DM 06/12/2008  . FATTY LIVER DISEASE 06/12/2008  . Gout, unspecified 07/01/2009  . HYPERCHOLESTEROLEMIA 05/02/2007  . HYPERTENSION 08/17/2006  . HYPOTHYROIDISM 08/17/2006  . Mild renal insufficiency    w/u NEG  . NASH (nonalcoholic steatohepatitis)     No past surgical history on file.  Social History   Social History  . Marital status: Single    Spouse name: N/A  . Number of children: N/A  . Years of education: N/A   Occupational History  . Production managerBuyer     for [employer]   Social History Main Topics  . Smoking status: Never Smoker  . Smokeless tobacco: Never Used  . Alcohol use Not on file  . Drug use: Unknown  . Sexual activity: Not on file   Other Topics Concern  . Not on file   Social History Narrative  . No narrative on file    Current Outpatient Prescriptions on File Prior to Visit  Medication Sig Dispense Refill  . allopurinol (ZYLOPRIM) 100 MG tablet Take 1 tablet (100 mg total) by mouth daily. 90 tablet 1  . aspirin 81 MG tablet Take 81 mg by mouth daily.      Marland Kitchen. atorvastatin (LIPITOR) 10 MG tablet TAKE ONE TABLET BY MOUTH DAILY 90 tablet 0  . Fish Oil-Cholecalciferol (FISH OIL + D3 PO) Take by mouth.    . levothyroxine (SYNTHROID, LEVOTHROID) 125 MCG tablet Take 1 tablet (125 mcg total) by mouth daily with breakfast. 90 tablet 0  . losartan-hydrochlorothiazide (HYZAAR) 100-25 MG tablet TAKE ONE TABLET BY MOUTH DAILY 90 tablet 0  . metoprolol tartrate (LOPRESSOR) 50 MG  tablet TAKE ONE TABLET BY MOUTH TWICE A DAY 180 tablet 0  . Multiple Vitamins-Minerals (MULTI COMPLETE PO) Take by mouth.     No current facility-administered medications on file prior to visit.     Allergies  Allergen Reactions  . Penicillins     REACTION: Rash  . Pioglitazone     REACTION: edema  . Tetracycline     REACTION: Rash    Family History  Problem Relation Age of Onset  . Cancer Mother        Breast Cancer  . Cancer Sister        Breast Cancer    BP (!) 144/84   Pulse 89   Ht 5\' 9"  (1.753 m)   Wt 223 lb (101.2 kg)   SpO2 95%   BMI 32.93 kg/m    Review of Systems Denies chest pain    Objective:   Physical Exam VITAL SIGNS:  See vs page GENERAL: no distress Pulses: dorsalis pedis intact bilat.   MSK: no deformity of the feet CV: no leg edema Skin:  no ulcer on the feet.  normal color and temp on the feet. Neuro: sensation is intact to touch on the feet.    A1c=7.5%  I personally reviewed electrocardiogram tracing (today): Indication: DM Impression: NSR.  No MI.  No hypertrophy. Compared to 2017:  no significant change  Lab Results  Component Value Date   HGBA1C 7.5 07/06/2016   Lab Results  Component Value Date   CHOL 174 07/06/2016   HDL 39.00 (L) 07/06/2016   LDLCALC 85 11/13/2013   LDLDIRECT 86.0 07/06/2016   TRIG 340.0 (H) 07/06/2016   CHOLHDL 4 07/06/2016   Lab Results  Component Value Date   LABURIC 3.8 (L) 07/06/2016      Assessment & Plan:  Type 2 DM: well-controlled.  Please continue the same medication HTN: he needs increased rx.  I added low-dose norvasc. Dyslipidemia: well-controlled.  Please continue the same medication.  Gout: well-controlled.  Please continue the same medication.     Subjective:   Patient here for Medicare annual wellness visit and management of other chronic and acute problems.     Risk factors: advanced age    Roster of Physicians Providing Medical Care to Patient:  See  "snapshot"   Activities of Daily Living: In your present state of health, do you have any difficulty performing the following activities (lives alone)?:  Preparing food and eating?: No  Bathing yourself: No  Getting dressed: No  Using the toilet:No  Moving around from place to place: No  In the past year have you fallen or had a near fall?: No    Home Safety: Has smoke detector and wears seat belts. No firearms. No excess sun exposure.  Diet and Exercise  Current exercise habits: pt says good Dietary issues discussed: pt reports a healthy diet.    Depression Screen  Q1: Over the past two weeks, have you felt down, depressed or hopeless? no  Q2: Over the past two weeks, have you felt little interest or pleasure in doing things? no   The following portions of the patient's history were reviewed and updated as appropriate: allergies, current medications, past family history, past medical history, past social history, past surgical history and problem list.   Review of Systems  Denies hearing loss, and visual loss.   Objective:   Vision:  Curator, so he declines VA today.   Hearing: grossly normal.  Body mass index:  See vs page.  Msk: pt easily and quickly performs "get-up-and-go" from a sitting position.  Cognitive Impairment Assessment: cognition, memory and judgment appear normal.  remembers 2/3 at 5 minutes (? effort).  excellent recall.  can easily read and write a sentence.  alert and oriented x 3.    Assessment:   Medicare wellness utd on preventive parameters.    Plan:   During the course of the visit the patient was educated and counseled about appropriate screening and preventive services including:        Fall prevention.     Diabetes screening.  Nutrition counseling.   Vaccines are updated as needed.   Patient Instructions (the written plan) was given to the patient.

## 2016-07-06 NOTE — Patient Instructions (Addendum)
I have sent 2 prescriptions to your pharmacy: to resume amlodipine, and to increase the metformin. Please come back for a follow-up appointment in 3 months.  blood tests are requested for you today.  We'll let you know about the results.   Please consider these measures for your health:  minimize alcohol.  Do not use tobacco products.  Have a colonoscopy at least every 10 years from age 71.  Keep firearms safely stored.  Always use seat belts.  have working smoke alarms in your home.  See an eye doctor and dentist regularly.  Never drive under the influence of alcohol or drugs (including prescription drugs).  Those with fair skin should take precautions against the sun, and should carefully examine their skin once per month, for any new or changed moles.  It is critically important to prevent falling down (keep floor areas well-lit, dry, and free of loose objects.  If you have a cane, walker, or wheelchair, you should use it, even for short trips around the house.  Wear flat-soled shoes.  Also, try not to rush).

## 2016-07-06 NOTE — Telephone Encounter (Signed)
Pharmacy called and stated that patient needs a refill on the one touch  test strips.

## 2016-07-06 NOTE — Telephone Encounter (Signed)
Rx submitted to [facility] 

## 2016-07-07 ENCOUNTER — Other Ambulatory Visit: Payer: Self-pay

## 2016-07-07 LAB — PTH, INTACT AND CALCIUM
Calcium: 10.2 mg/dL (ref 8.6–10.3)
PTH: 24 pg/mL (ref 14–64)

## 2016-07-07 LAB — HEPATITIS C ANTIBODY: HCV Ab: NEGATIVE

## 2016-07-07 MED ORDER — ACCU-CHEK AVIVA PLUS W/DEVICE KIT: PACK | 1 refills | Status: DC

## 2016-07-07 MED ORDER — ACCU-CHEK FASTCLIX LANCETS MISC: 2 refills | Status: DC

## 2016-07-07 MED ORDER — ACCU-CHEK SOFTCLIX LANCETS MISC: 3 refills | Status: DC

## 2016-07-07 MED ORDER — GLUCOSE BLOOD VI STRP: ORAL_STRIP | 2 refills | Status: DC

## 2016-07-07 NOTE — Telephone Encounter (Signed)
Pharmacy notified to dispense softclix lancet.

## 2016-07-07 NOTE — Telephone Encounter (Signed)
Clarification on script for accu check fast click lancets? Last script was for a soft click and tech needs to know if fast click will work for the patient.  Please call back to clarify.  Thank you,  -LL

## 2016-07-15 ENCOUNTER — Other Ambulatory Visit: Payer: Self-pay | Admitting: Endocrinology

## 2016-07-17 DIAGNOSIS — Z1212 Encounter for screening for malignant neoplasm of rectum: Secondary | ICD-10-CM | POA: Diagnosis not present

## 2016-07-17 DIAGNOSIS — Z1211 Encounter for screening for malignant neoplasm of colon: Secondary | ICD-10-CM | POA: Diagnosis not present

## 2016-07-17 LAB — COLOGUARD

## 2016-07-29 ENCOUNTER — Other Ambulatory Visit: Payer: Self-pay | Admitting: Endocrinology

## 2016-09-16 ENCOUNTER — Other Ambulatory Visit: Payer: Self-pay | Admitting: Endocrinology

## 2016-10-05 ENCOUNTER — Encounter: Payer: Self-pay | Admitting: Endocrinology

## 2016-10-05 ENCOUNTER — Ambulatory Visit (INDEPENDENT_AMBULATORY_CARE_PROVIDER_SITE_OTHER): Payer: Medicare HMO | Admitting: Endocrinology

## 2016-10-05 VITALS — BP 158/96 | HR 75 | Wt 214.4 lb

## 2016-10-05 DIAGNOSIS — E1122 Type 2 diabetes mellitus with diabetic chronic kidney disease: Secondary | ICD-10-CM | POA: Diagnosis not present

## 2016-10-05 DIAGNOSIS — N183 Chronic kidney disease, stage 3 (moderate): Secondary | ICD-10-CM | POA: Diagnosis not present

## 2016-10-05 LAB — POCT GLYCOSYLATED HEMOGLOBIN (HGB A1C): HEMOGLOBIN A1C: 6.2

## 2016-10-05 MED ORDER — AMLODIPINE BESYLATE 5 MG PO TABS: 5.0000 mg | ORAL_TABLET | Freq: Every day | ORAL | 3 refills | Status: DC

## 2016-10-05 NOTE — Progress Notes (Signed)
Subjective:    Patient ID: Paul Banks, male    DOB: 12/09/1945, 71 y.o.   MRN: [id number]  HPI Pt returns for f/u of diabetes mellitus:  DM type: 2 Dx'ed: 6948 Complications: none Therapy: metformin DKA: never Severe hypoglycemia: never.  Pancreatitis: never Pancreatic imaging: never (could not see on 2008 [place]).  Other: he has never been on insulin.  Interval history: denies sob.  no cbg record, but states cbg's are well-controlled Past Medical History:  Diagnosis Date  . ANEMIA 07/01/2009  . COUGH DUE TO ACE INHIBITORS 09/10/2008  . DM 06/12/2008  . FATTY LIVER DISEASE 06/12/2008  . Gout, unspecified 07/01/2009  . HYPERCHOLESTEROLEMIA 05/02/2007  . HYPERTENSION 08/17/2006  . HYPOTHYROIDISM 08/17/2006  . Mild renal insufficiency    w/u NEG  . NASH (nonalcoholic steatohepatitis)     No past surgical history on file.  Social History   Social History  . Marital status: Single    Spouse name: N/A  . Number of children: N/A  . Years of education: N/A   Occupational History  . Banker     for [employer]   Social History Main Topics  . Smoking status: Never Smoker  . Smokeless tobacco: Never Used  . Alcohol use Not on file  . Drug use: Unknown  . Sexual activity: Not on file   Other Topics Concern  . Not on file   Social History Narrative  . No narrative on file    Current Outpatient Prescriptions on File Prior to Visit  Medication Sig Dispense Refill  . ACCU-CHEK SOFTCLIX LANCETS lancets Check Blood sugar 1 time per day.  Dx Code: E11.9 100 each 3  . allopurinol (ZYLOPRIM) 100 MG tablet Take 1 tablet (100 mg total) by mouth daily. 90 tablet 1  . aspirin 81 MG tablet Take 81 mg by mouth daily.      Marland Kitchen atorvastatin (LIPITOR) 10 MG tablet TAKE ONE TABLET BY MOUTH DAILY 90 tablet 0  . Blood Glucose Monitoring Suppl (ACCU-CHEK AVIVA PLUS) w/Device KIT Check blood sugar 1 time per day. Dx Code: E11.9 1 kit 1  . Fish Oil-Cholecalciferol (FISH OIL + D3 PO) Take by  mouth.    Marland Kitchen glucose blood (ACCU-CHEK AVIVA PLUS) test strip Use to check blood sugar 1 time per day. Dx Code: E11.9 100 each 2  . latanoprost (XALATAN) 0.005 % ophthalmic solution     . levothyroxine (SYNTHROID, LEVOTHROID) 125 MCG tablet Take 1 tablet (125 mcg total) by mouth daily with breakfast. 90 tablet 0  . losartan-hydrochlorothiazide (HYZAAR) 100-25 MG tablet TAKE ONE TABLET BY MOUTH DAILY 90 tablet 0  . metFORMIN (GLUCOPHAGE-XR) 500 MG 24 hr tablet Take 4 tablets (2,000 mg total) by mouth daily. 360 tablet 3  . metoprolol tartrate (LOPRESSOR) 50 MG tablet TAKE ONE TABLET BY MOUTH TWICE A DAY 180 tablet 0  . Multiple Vitamins-Minerals (MULTI COMPLETE PO) Take by mouth.     No current facility-administered medications on file prior to visit.     Allergies  Allergen Reactions  . Penicillins     REACTION: Rash  . Pioglitazone     REACTION: edema  . Tetracycline     REACTION: Rash    Family History  Problem Relation Age of Onset  . Cancer Mother        Breast Cancer  . Cancer Sister        Breast Cancer    BP (!) 158/96   Pulse 75   Wt 214  lb 6.4 oz (97.3 kg)   SpO2 95%   BMI 31.66 kg/m    Review of Systems Denies sob.     Objective:   Physical Exam VITAL SIGNS:  See vs page GENERAL: no distress Pulses: foot pulses are intact bilaterally.   MSK: no deformity of the feet or ankles.  CV: no edema of the legs or ankles Skin:  no ulcer on the feet or ankles.  normal color and temp on the feet and ankles Neuro: sensation is intact to touch on the feet and ankles.      Lab Results  Component Value Date   HGBA1C 6.2 10/05/2016   Lab Results  Component Value Date   CREATININE 1.19 07/06/2016   BUN 22 07/06/2016   NA 140 07/06/2016   K 3.7 07/06/2016   CL 100 07/06/2016   CO2 32 07/06/2016      Assessment & Plan:  Insulin-requiring type 2 DM, with renal insuff: he needs increased rx, if it can be done with a regimen that avoids or minimizes  hypoglycemia. HTN: worse.  Patient Instructions  Please continue the same metformin, and: I have sent 2 prescriptions to your pharmacy: to resume amlodipine, and to increase the metformin Please schedule a blood pressure recheck in 2 weeks.   Please come back for a follow-up appointment in 4 months.

## 2016-10-05 NOTE — Patient Instructions (Addendum)
Please continue the same metformin, and: I have sent 2 prescriptions to your pharmacy: to resume amlodipine, and to increase the metformin Please schedule a blood pressure recheck in 2 weeks.   Please come back for a follow-up appointment in 4 months.

## 2016-10-11 ENCOUNTER — Other Ambulatory Visit: Payer: Self-pay | Admitting: Endocrinology

## 2016-10-12 LAB — HM DIABETES EYE EXAM

## 2016-10-19 ENCOUNTER — Ambulatory Visit: Payer: Medicare HMO

## 2016-10-27 ENCOUNTER — Telehealth: Payer: Self-pay

## 2016-10-27 NOTE — Telephone Encounter (Signed)
Essential HTN, which is on prob list

## 2016-10-27 NOTE — Telephone Encounter (Signed)
Patient came in yesterday for a blood pressure check in a nurse visit but there is no diagnosis to associate it with can you please advise

## 2016-11-11 ENCOUNTER — Other Ambulatory Visit: Payer: Self-pay | Admitting: Endocrinology

## 2016-11-21 NOTE — Telephone Encounter (Signed)
This has been resolved

## 2016-12-13 ENCOUNTER — Other Ambulatory Visit: Payer: Self-pay | Admitting: Endocrinology

## 2016-12-16 ENCOUNTER — Other Ambulatory Visit: Payer: Self-pay | Admitting: Endocrinology

## 2016-12-23 NOTE — Telephone Encounter (Signed)
Error

## 2017-01-06 ENCOUNTER — Other Ambulatory Visit: Payer: Self-pay | Admitting: Endocrinology

## 2017-01-18 ENCOUNTER — Other Ambulatory Visit: Payer: Self-pay | Admitting: Endocrinology

## 2017-02-01 ENCOUNTER — Ambulatory Visit: Payer: Medicare HMO | Admitting: Endocrinology

## 2017-02-02 DIAGNOSIS — H04123 Dry eye syndrome of bilateral lacrimal glands: Secondary | ICD-10-CM | POA: Diagnosis not present

## 2017-02-02 DIAGNOSIS — H25813 Combined forms of age-related cataract, bilateral: Secondary | ICD-10-CM | POA: Diagnosis not present

## 2017-02-02 DIAGNOSIS — H401131 Primary open-angle glaucoma, bilateral, mild stage: Secondary | ICD-10-CM | POA: Diagnosis not present

## 2017-02-02 DIAGNOSIS — H524 Presbyopia: Secondary | ICD-10-CM | POA: Diagnosis not present

## 2017-02-08 ENCOUNTER — Other Ambulatory Visit: Payer: Self-pay | Admitting: Endocrinology

## 2017-02-22 ENCOUNTER — Ambulatory Visit (INDEPENDENT_AMBULATORY_CARE_PROVIDER_SITE_OTHER): Payer: Medicare HMO | Admitting: Endocrinology

## 2017-02-22 ENCOUNTER — Encounter: Payer: Self-pay | Admitting: Endocrinology

## 2017-02-22 VITALS — BP 171/99 | HR 91 | Ht 69.0 in | Wt 205.4 lb

## 2017-02-22 DIAGNOSIS — E1122 Type 2 diabetes mellitus with diabetic chronic kidney disease: Secondary | ICD-10-CM | POA: Diagnosis not present

## 2017-02-22 DIAGNOSIS — N183 Chronic kidney disease, stage 3 (moderate): Secondary | ICD-10-CM | POA: Diagnosis not present

## 2017-02-22 DIAGNOSIS — Z0000 Encounter for general adult medical examination without abnormal findings: Secondary | ICD-10-CM

## 2017-02-22 LAB — POCT GLYCOSYLATED HEMOGLOBIN (HGB A1C): HEMOGLOBIN A1C: 5.5

## 2017-02-22 MED ORDER — AMLODIPINE BESYLATE 10 MG PO TABS: 10.0000 mg | ORAL_TABLET | Freq: Every day | ORAL | 3 refills | Status: DC

## 2017-02-22 NOTE — Progress Notes (Signed)
Subjective:    Patient ID: Paul Banks, male    DOB: 02/22/1945, 72 y.o.   MRN: [id number]  HPI Pt is here for regular wellness examination, and is feeling pretty well in general, and says chronic med probs are stable, except as noted below Past Medical History:  Diagnosis Date  . ANEMIA 07/01/2009  . COUGH DUE TO ACE INHIBITORS 09/10/2008  . DM 06/12/2008  . FATTY LIVER DISEASE 06/12/2008  . Gout, unspecified 07/01/2009  . HYPERCHOLESTEROLEMIA 05/02/2007  . HYPERTENSION 08/17/2006  . HYPOTHYROIDISM 08/17/2006  . Mild renal insufficiency    w/u NEG  . NASH (nonalcoholic steatohepatitis)       Current Outpatient Medications on File Prior to Visit  Medication Sig Dispense Refill  . ACCU-CHEK SOFTCLIX LANCETS lancets Check Blood sugar 1 time per day.  Dx Code: E11.9 100 each 3  . allopurinol (ZYLOPRIM) 100 MG tablet TAKE ONE TABLET BY MOUTH DAILY 90 tablet 0  . aspirin 81 MG tablet Take 81 mg by mouth daily.      Marland Kitchen atorvastatin (LIPITOR) 10 MG tablet TAKE ONE TABLET BY MOUTH DAILY 90 tablet 0  . Blood Glucose Monitoring Suppl (ACCU-CHEK AVIVA PLUS) w/Device KIT Check blood sugar 1 time per day. Dx Code: E11.9 1 kit 1  . Fish Oil-Cholecalciferol (FISH OIL + D3 PO) Take by mouth.    Marland Kitchen glucose blood (ACCU-CHEK AVIVA PLUS) test strip Use to check blood sugar 1 time per day. Dx Code: E11.9 100 each 2  . latanoprost (XALATAN) 0.005 % ophthalmic solution     . levothyroxine (SYNTHROID, LEVOTHROID) 125 MCG tablet Take 1 tablet (125 mcg total) by mouth daily with breakfast. 90 tablet 0  . losartan-hydrochlorothiazide (HYZAAR) 100-25 MG tablet TAKE ONE TABLET BY MOUTH DAILY 90 tablet 0  . metFORMIN (GLUCOPHAGE-XR) 500 MG 24 hr tablet Take 4 tablets (2,000 mg total) by mouth daily. 360 tablet 3  . metoprolol tartrate (LOPRESSOR) 50 MG tablet TAKE ONE TABLET BY MOUTH TWICE A DAY 180 tablet 0  . Multiple Vitamins-Minerals (MULTI COMPLETE PO) Take by mouth.     No current facility-administered  medications on file prior to visit.     Allergies  Allergen Reactions  . Penicillins     REACTION: Rash  . Pioglitazone     REACTION: edema  . Tetracycline     REACTION: Rash    Family History  Problem Relation Age of Onset  . Cancer Mother        Breast Cancer  . Cancer Sister        Breast Cancer    BP (!) 171/99 (BP Location: Left Wrist, Cuff Size: Normal)   Pulse 91   Ht '5\' 9"'$  (1.753 m)   Wt 205 lb 6.4 oz (93.2 kg)   BMI 30.33 kg/m    Review of Systems Denies fever, fatigue, visual loss, hearing loss, chest pain, sob, back pain, depression, cold intolerance, BRBPR, hematuria, syncope, numbness, allergy sxs, easy bruising, and rash.       Objective:   Physical Exam VS: see vs page GEN: no distress HEAD: head: no deformity eyes: no periorbital swelling, no proptosis external nose and ears are normal mouth: no lesion seen NECK: supple, thyroid is not enlarged CHEST WALL: no deformity LUNGS: clear to auscultation BREASTS:  bilat pseudogynecomastia CV: reg rate and rhythm, no murmur ABD: abdomen is soft, nontender.  no hepatosplenomegaly.  not distended.  no hernia RECTAL: normal external and internal exam.  heme neg. PROSTATE:  Slightly enlarged.  No nodule.   MUSCULOSKELETAL: muscle bulk and strength are grossly normal.  no obvious joint swelling.  gait is normal and steady EXTEMITIES: no deformity.  no ulcer on the feet.  feet are of normal color and temp.  no edema PULSES: dorsalis pedis intact bilat.  no carotid bruit NEURO:  cn 2-12 grossly intact.   readily moves all 4's.  sensation is intact to touch on the feet SKIN:  Normal texture and temperature.  No rash or suspicious lesion is visible.   NODES:  None palpable at the neck PSYCH: alert, well-oriented.  Does not appear anxious nor depressed.   Lab Results  Component Value Date   HGBA1C 5.5 02/22/2017      Assessment & Plan:  Wellness visit today, with problems stable, except as noted.      Patient Instructions  Please consider these measures for your health:  minimize alcohol.  Do not use tobacco products.  Have a colonoscopy at least every 10 years from age 70. Keep firearms safely stored.  Always use seat belts.  have working smoke alarms in your home.  See an eye doctor and dentist regularly.  Never drive under the influence of alcohol or drugs (including prescription drugs).  Those with fair skin should take precautions against the sun, and should carefully examine their skin once per month, for any new or changed moles. I have sent a prescription to your pharmacy, to double the amlodipine  Please come back for a follow-up appointment in 6 months.

## 2017-02-22 NOTE — Patient Instructions (Addendum)
Please consider these measures for your health:  minimize alcohol.  Do not use tobacco products.  Have a colonoscopy at least every 10 years from age 70. Keep firearms safely stored.  Always use seat belts.  have working smoke alarms in your home.  See an eye doctor and dentist regularly.  Never drive under the influence of alcohol or drugs (including prescription drugs).  Those with fair skin should take precautions against the sun, and should carefully examine their skin once per month, for any new or changed moles. I have sent a prescription to your pharmacy, to double the amlodipine  Please come back for a follow-up appointment in 6 months.

## 2017-03-10 ENCOUNTER — Other Ambulatory Visit: Payer: Self-pay | Admitting: Endocrinology

## 2017-03-17 ENCOUNTER — Other Ambulatory Visit: Payer: Self-pay | Admitting: Endocrinology

## 2017-04-03 ENCOUNTER — Other Ambulatory Visit: Payer: Self-pay | Admitting: Endocrinology

## 2017-04-19 ENCOUNTER — Other Ambulatory Visit: Payer: Self-pay | Admitting: Endocrinology

## 2017-05-09 ENCOUNTER — Other Ambulatory Visit: Payer: Self-pay | Admitting: Internal Medicine

## 2017-06-06 ENCOUNTER — Other Ambulatory Visit: Payer: Self-pay | Admitting: Endocrinology

## 2017-06-20 ENCOUNTER — Other Ambulatory Visit: Payer: Self-pay | Admitting: Endocrinology

## 2017-07-04 ENCOUNTER — Other Ambulatory Visit: Payer: Self-pay | Admitting: Endocrinology

## 2017-07-20 ENCOUNTER — Other Ambulatory Visit: Payer: Self-pay | Admitting: Endocrinology

## 2017-08-03 DIAGNOSIS — H25813 Combined forms of age-related cataract, bilateral: Secondary | ICD-10-CM | POA: Diagnosis not present

## 2017-08-03 DIAGNOSIS — H04123 Dry eye syndrome of bilateral lacrimal glands: Secondary | ICD-10-CM | POA: Diagnosis not present

## 2017-08-03 DIAGNOSIS — H401131 Primary open-angle glaucoma, bilateral, mild stage: Secondary | ICD-10-CM | POA: Diagnosis not present

## 2017-08-03 DIAGNOSIS — H524 Presbyopia: Secondary | ICD-10-CM | POA: Diagnosis not present

## 2017-08-04 ENCOUNTER — Other Ambulatory Visit: Payer: Self-pay | Admitting: Endocrinology

## 2017-08-23 ENCOUNTER — Ambulatory Visit: Payer: Medicare HMO | Admitting: Endocrinology

## 2017-08-24 DIAGNOSIS — H401131 Primary open-angle glaucoma, bilateral, mild stage: Secondary | ICD-10-CM | POA: Diagnosis not present

## 2017-09-01 ENCOUNTER — Other Ambulatory Visit: Payer: Self-pay | Admitting: Endocrinology

## 2017-09-13 ENCOUNTER — Ambulatory Visit: Payer: Medicare HMO | Admitting: Endocrinology

## 2017-09-15 ENCOUNTER — Other Ambulatory Visit: Payer: Self-pay | Admitting: Endocrinology

## 2017-09-25 ENCOUNTER — Encounter: Payer: Self-pay | Admitting: Endocrinology

## 2017-09-25 ENCOUNTER — Ambulatory Visit (INDEPENDENT_AMBULATORY_CARE_PROVIDER_SITE_OTHER): Payer: Medicare HMO | Admitting: Endocrinology

## 2017-09-25 VITALS — BP 184/94 | HR 92 | Ht 69.0 in | Wt 210.2 lb

## 2017-09-25 DIAGNOSIS — E1122 Type 2 diabetes mellitus with diabetic chronic kidney disease: Secondary | ICD-10-CM

## 2017-09-25 DIAGNOSIS — N183 Chronic kidney disease, stage 3 unspecified: Secondary | ICD-10-CM

## 2017-09-25 DIAGNOSIS — Z125 Encounter for screening for malignant neoplasm of prostate: Secondary | ICD-10-CM | POA: Diagnosis not present

## 2017-09-25 DIAGNOSIS — Z0000 Encounter for general adult medical examination without abnormal findings: Secondary | ICD-10-CM | POA: Diagnosis not present

## 2017-09-25 DIAGNOSIS — M1A9XX0 Chronic gout, unspecified, without tophus (tophi): Secondary | ICD-10-CM

## 2017-09-25 LAB — POCT GLYCOSYLATED HEMOGLOBIN (HGB A1C): HEMOGLOBIN A1C: 5.8 % — AB (ref 4.0–5.6)

## 2017-09-25 MED ORDER — FUROSEMIDE 20 MG PO TABS: 20.0000 mg | ORAL_TABLET | Freq: Every day | ORAL | 3 refills | Status: DC

## 2017-09-25 MED ORDER — METOPROLOL TARTRATE 50 MG PO TABS: 50.0000 mg | ORAL_TABLET | Freq: Two times a day (BID) | ORAL | 3 refills | Status: DC

## 2017-09-25 MED ORDER — ATORVASTATIN CALCIUM 10 MG PO TABS: 10.0000 mg | ORAL_TABLET | Freq: Every day | ORAL | 3 refills | Status: DC

## 2017-09-25 MED ORDER — LEVOTHYROXINE SODIUM 125 MCG PO TABS: 125.0000 ug | ORAL_TABLET | Freq: Every day | ORAL | 3 refills | Status: DC

## 2017-09-25 MED ORDER — ALLOPURINOL 100 MG PO TABS: 100.0000 mg | ORAL_TABLET | Freq: Every day | ORAL | 3 refills | Status: DC

## 2017-09-25 MED ORDER — LOSARTAN POTASSIUM-HCTZ 100-25 MG PO TABS: 1.0000 | ORAL_TABLET | Freq: Every day | ORAL | 3 refills | Status: DC

## 2017-09-25 MED ORDER — METFORMIN HCL ER 500 MG PO TB24: 2000.0000 mg | ORAL_TABLET | Freq: Every day | ORAL | 3 refills | Status: DC

## 2017-09-25 NOTE — Progress Notes (Signed)
Subjective:    Patient ID: Paul Banks, male    DOB: Dec 01, 1945, 72 y.o.   MRN: [id number]  HPI Pt returns for f/u of diabetes mellitus:  DM type: 2 Dx'ed: 2008 Complications: none.  Therapy: metformin.   DKA: never.   Severe hypoglycemia: never.  Pancreatitis: never Pancreatic imaging: never (could not see on 2008 [place]).  Other: he has never been on insulin.  Interval history: pt states he feels well in general.  no cbg record, but states cbg's are well-controlled.   Past Medical History:  Diagnosis Date  . ANEMIA 07/01/2009  . COUGH DUE TO ACE INHIBITORS 09/10/2008  . DM 06/12/2008  . FATTY LIVER DISEASE 06/12/2008  . Gout, unspecified 07/01/2009  . HYPERCHOLESTEROLEMIA 05/02/2007  . HYPERTENSION 08/17/2006  . HYPOTHYROIDISM 08/17/2006  . Mild renal insufficiency    w/u NEG  . NASH (nonalcoholic steatohepatitis)      Social History   Socioeconomic History  . Marital status: Single    Spouse name: Not on file  . Number of children: Not on file  . Years of education: Not on file  . Highest education level: Not on file  Occupational History  . Occupation: Production manager    Comment: for [employer]  Social Needs  . Financial resource strain: Not on file  . Food insecurity:    Worry: Not on file    Inability: Not on file  . Transportation needs:    Medical: Not on file    Non-medical: Not on file  Tobacco Use  . Smoking status: Never Smoker  . Smokeless tobacco: Never Used  Substance and Sexual Activity  . Alcohol use: Not on file  . Drug use: Not on file  . Sexual activity: Not on file  Lifestyle  . Physical activity:    Days per week: Not on file    Minutes per session: Not on file  . Stress: Not on file  Relationships  . Social connections:    Talks on phone: Not on file    Gets together: Not on file    Attends religious service: Not on file    Active member of club or organization: Not on file    Attends meetings of clubs or organizations: Not on file   Relationship status: Not on file  . Intimate partner violence:    Fear of current or ex partner: Not on file    Emotionally abused: Not on file    Physically abused: Not on file    Forced sexual activity: Not on file  Other Topics Concern  . Not on file  Social History Narrative  . Not on file    Current Outpatient Medications on File Prior to Visit  Medication Sig Dispense Refill  . ACCU-CHEK SOFTCLIX LANCETS lancets Check Blood sugar 1 time per day.  Dx Code: E11.9 100 each 3  . amLODipine (NORVASC) 10 MG tablet Take 1 tablet (10 mg total) by mouth daily. 90 tablet 3  . aspirin 81 MG tablet Take 81 mg by mouth daily.      . Fish Oil-Cholecalciferol (FISH OIL + D3 PO) Take by mouth.    Marland Kitchen glucose blood (ACCU-CHEK AVIVA PLUS) test strip Use to check blood sugar 1 time per day. Dx Code: E11.9 100 each 2  . Multiple Vitamins-Minerals (MULTI COMPLETE PO) Take by mouth.    . timolol (TIMOPTIC) 0.5 % ophthalmic solution      No current facility-administered medications on file prior to visit.  Allergies  Allergen Reactions  . Penicillins     REACTION: Rash  . Pioglitazone     REACTION: edema  . Tetracycline     REACTION: Rash    Family History  Problem Relation Age of Onset  . Cancer Mother        Breast Cancer  . Cancer Sister        Breast Cancer    BP (!) 184/94 (BP Location: Left Arm)   Pulse 92   Ht 5\' 9"  (1.753 m)   Wt 210 lb 3.2 oz (95.3 kg)   SpO2 98%   BMI 31.04 kg/m    Review of Systems He has gained a few lbs.      Objective:   Physical Exam VITAL SIGNS:  See vs page GENERAL: no distress Pulses: foot pulses are intact bilaterally.   MSK: no deformity of the feet or ankles.  CV: no edema of the legs or ankles Skin:  no ulcer on the feet or ankles.  normal color and temp on the feet and ankles Neuro: sensation is intact to touch on the feet and ankles.    Lab Results  Component Value Date   CREATININE 1.19 07/06/2016   BUN 22 07/06/2016   NA  140 07/06/2016   K 3.7 07/06/2016   CL 100 07/06/2016   CO2 32 07/06/2016   I personally reviewed electrocardiogram tracing (today): Indication: DM Impression: NSR.  No MI.  No hypertrophy. Compared to: no significant change.    Lab Results  Component Value Date   HGBA1C 5.8 (A) 09/25/2017      Assessment & Plan:  Type 2 DM: well-controlled HTN: worse.   Dyslipidemia: recheck today. Gout: recheck today.  Subjective:   Patient here for Medicare annual wellness visit and management of other chronic and acute problems.     Risk factors: advanced age    Roster of Physicians Providing Medical Care to Patient:  See "snapshot"   Activities of Daily Living: In your present state of health, do you have any difficulty performing the following activities (lives with sister)?:  Preparing food and eating?: No  Bathing yourself: No  Getting dressed: No  Using the toilet:No  Moving around from place to place: No  In the past year have you fallen or had a near fall?: No    Home Safety: Has smoke detector and wears seat belts. No firearms. No excess sun exposure.    Opioid Use: none   Diet and Exercise  Current exercise habits: pt says good Dietary issues discussed: pt reports a healthy diet   Depression Screen  Q1: Over the past two weeks, have you felt down, depressed or hopeless? no  Q2: Over the past two weeks, have you felt little interest or pleasure in doing things? no   The following portions of the patient's history were reviewed and updated as appropriate: allergies, current medications, past family history, past medical history, past social history, past surgical history and problem list.   Review of Systems  Denies hearing loss, and visual loss Objective:   Vision:  See [facility] today Hearing: grossly normal Body mass index:  See vs page Msk: pt easily and quickly performs "get-up-and-go" from a sitting position Cognitive Impairment Assessment: cognition, memory  and judgment appear normal.  remembers 2/3 at 5 minutes (? effort).  excellent recall.  can easily read and write a sentence.  alert and oriented x 3.    Assessment:   Medicare wellness utd on  preventive parameters    Plan:   During the course of the visit the patient was educated and counseled about appropriate screening and preventive services including:        Fall prevention is advised today   Diabetes screening is UTD Nutrition counseling is offered  advanced directives/end of life addressed today:  see healthcare directives hyperlink  Vaccines are updated as needed  Patient Instructions (the written plan) was given to the patient.

## 2017-09-25 NOTE — Patient Instructions (Addendum)
Please consider these measures for your health:  minimize alcohol.  Do not use tobacco products.  Have a colonoscopy at least every 10 years from age 70. Keep firearms safely stored.  Always use seat belts.  have working smoke alarms in your home.  See an eye doctor and dentist regularly.  Never drive under the influence of alcohol or drugs (including prescription drugs).  Those with fair skin should take precautions against the sun, and should carefully examine their skin once per month, for any new or changed moles. please let me know what your wishes would be, if artificial life support measures should become necessary.  It is critically important to prevent falling down (keep floor areas well-lit, dry, and free of loose objects.  If you have a cane, walker, or wheelchair, you should use it, even for short trips around the house.  Wear flat-soled shoes.  Also, try not to rush) I have sent a prescription to your pharmacy, for an additional fluid pill.   Please come back in 2 weeks for a blood pressure check, and blood tests.

## 2017-09-25 NOTE — Progress Notes (Signed)
we discussed code status.  pt requests full code, but would not want to be started or maintained on artificial life-support measures if there was not a reasonable chance of recovery 

## 2017-10-11 ENCOUNTER — Other Ambulatory Visit: Payer: Medicare HMO

## 2017-10-18 ENCOUNTER — Other Ambulatory Visit (INDEPENDENT_AMBULATORY_CARE_PROVIDER_SITE_OTHER): Payer: Medicare HMO

## 2017-10-18 DIAGNOSIS — N183 Chronic kidney disease, stage 3 (moderate): Secondary | ICD-10-CM

## 2017-10-18 DIAGNOSIS — Z125 Encounter for screening for malignant neoplasm of prostate: Secondary | ICD-10-CM

## 2017-10-18 DIAGNOSIS — E1122 Type 2 diabetes mellitus with diabetic chronic kidney disease: Secondary | ICD-10-CM

## 2017-10-18 DIAGNOSIS — M1A9XX0 Chronic gout, unspecified, without tophus (tophi): Secondary | ICD-10-CM | POA: Diagnosis not present

## 2017-10-18 LAB — URINALYSIS, ROUTINE W REFLEX MICROSCOPIC
BILIRUBIN URINE: NEGATIVE
HGB URINE DIPSTICK: NEGATIVE
KETONES UR: NEGATIVE
LEUKOCYTES UA: NEGATIVE
Nitrite: NEGATIVE
RBC / HPF: NONE SEEN (ref 0–?)
Specific Gravity, Urine: 1.01 (ref 1.000–1.030)
Total Protein, Urine: NEGATIVE
UROBILINOGEN UA: 0.2 (ref 0.0–1.0)
Urine Glucose: NEGATIVE
WBC UA: NONE SEEN (ref 0–?)
pH: 7.5 (ref 5.0–8.0)

## 2017-10-18 LAB — CBC WITH DIFFERENTIAL/PLATELET
BASOS ABS: 0.1 10*3/uL (ref 0.0–0.1)
Basophils Relative: 0.8 % (ref 0.0–3.0)
EOS ABS: 0.2 10*3/uL (ref 0.0–0.7)
Eosinophils Relative: 2.1 % (ref 0.0–5.0)
HCT: 44 % (ref 39.0–52.0)
Hemoglobin: 15.2 g/dL (ref 13.0–17.0)
LYMPHS ABS: 2.8 10*3/uL (ref 0.7–4.0)
Lymphocytes Relative: 29.8 % (ref 12.0–46.0)
MCHC: 34.6 g/dL (ref 30.0–36.0)
MCV: 84.7 fl (ref 78.0–100.0)
MONOS PCT: 6.9 % (ref 3.0–12.0)
Monocytes Absolute: 0.6 10*3/uL (ref 0.1–1.0)
Neutro Abs: 5.6 10*3/uL (ref 1.4–7.7)
Neutrophils Relative %: 60.4 % (ref 43.0–77.0)
Platelets: 240 10*3/uL (ref 150.0–400.0)
RBC: 5.2 Mil/uL (ref 4.22–5.81)
RDW: 14.9 % (ref 11.5–15.5)
WBC: 9.3 10*3/uL (ref 4.0–10.5)

## 2017-10-18 LAB — BASIC METABOLIC PANEL
BUN: 18 mg/dL (ref 6–23)
CHLORIDE: 95 meq/L — AB (ref 96–112)
CO2: 38 mEq/L — ABNORMAL HIGH (ref 19–32)
Calcium: 10.1 mg/dL (ref 8.4–10.5)
Creatinine, Ser: 1.21 mg/dL (ref 0.40–1.50)
GFR: 62.94 mL/min (ref >60.00)
Glucose, Bld: 148 mg/dL — ABNORMAL HIGH (ref 70–99)
POTASSIUM: 3.4 meq/L — AB (ref 3.5–5.1)
SODIUM: 140 meq/L (ref 135–145)

## 2017-10-18 LAB — HEPATIC FUNCTION PANEL
ALT: 15 U/L (ref 0–53)
AST: 17 U/L (ref 0–37)
Albumin: 4.5 g/dL (ref 3.5–5.2)
Alkaline Phosphatase: 60 U/L (ref 39–117)
BILIRUBIN DIRECT: 0.2 mg/dL (ref 0.0–0.3)
BILIRUBIN TOTAL: 1.1 mg/dL (ref 0.2–1.2)
Total Protein: 7.7 g/dL (ref 6.0–8.3)

## 2017-10-18 LAB — MICROALBUMIN / CREATININE URINE RATIO
CREATININE, U: 20.5 mg/dL
Microalb Creat Ratio: 8.2 mg/g (ref 0.0–30.0)
Microalb, Ur: 1.7 mg/dL (ref 0.0–1.9)

## 2017-10-18 LAB — LIPID PANEL
CHOLESTEROL: 136 mg/dL (ref 0–200)
HDL: 40.9 mg/dL (ref >39.00)
LDL Cholesterol: 65 mg/dL (ref 0–99)
NonHDL: 94.81
TRIGLYCERIDES: 148 mg/dL (ref 0.0–149.0)
Total CHOL/HDL Ratio: 3
VLDL: 29.6 mg/dL (ref 0.0–40.0)

## 2017-10-18 LAB — PSA: PSA: 0.84 ng/mL (ref 0.10–4.00)

## 2017-10-18 LAB — URIC ACID: URIC ACID, SERUM: 3.6 mg/dL — AB (ref 4.0–7.8)

## 2017-10-18 LAB — VITAMIN D 25 HYDROXY (VIT D DEFICIENCY, FRACTURES): VITD: 36.76 ng/mL (ref 30.00–100.00)

## 2017-10-18 LAB — TSH: TSH: 5.13 u[IU]/mL — AB (ref 0.35–4.50)

## 2017-10-19 LAB — PTH, INTACT AND CALCIUM
Calcium: 9.8 mg/dL (ref 8.6–10.3)
PTH: 34 pg/mL (ref 14–64)

## 2017-10-20 ENCOUNTER — Encounter: Payer: Self-pay | Admitting: Endocrinology

## 2017-10-20 ENCOUNTER — Ambulatory Visit (INDEPENDENT_AMBULATORY_CARE_PROVIDER_SITE_OTHER): Payer: Medicare HMO | Admitting: Endocrinology

## 2017-10-20 VITALS — BP 122/82 | HR 71 | Ht 69.0 in | Wt 209.0 lb

## 2017-10-20 DIAGNOSIS — I10 Essential (primary) hypertension: Secondary | ICD-10-CM

## 2017-10-20 MED ORDER — LEVOTHYROXINE SODIUM 137 MCG PO TABS: 137.0000 ug | ORAL_TABLET | Freq: Every day | ORAL | 3 refills | Status: DC

## 2017-10-20 MED ORDER — ALLOPURINOL 100 MG PO TABS: 50.0000 mg | ORAL_TABLET | Freq: Every day | ORAL | 3 refills | Status: DC

## 2017-10-20 MED ORDER — POTASSIUM CHLORIDE ER 8 MEQ PO TBCR: 8.0000 meq | EXTENDED_RELEASE_TABLET | Freq: Every day | ORAL | 3 refills | Status: DC

## 2017-10-20 NOTE — Patient Instructions (Addendum)
I have sent these prescriptions to your pharmacy:  Please reduce the allopurinol to 1/2 pill per day, start potassium, and increase the levothyroxine to 137 mcg/day.   Best wishes with your new primary care provider.

## 2017-10-20 NOTE — Progress Notes (Signed)
Subjective:    Patient ID: Paul Banks, male    DOB: [birth year], 72 y.o.   MRN: [id number]  HPI  The state of at least three ongoing medical problems is addressed today, with interval history of each noted here: Edema: pt says this is improved.  This is a stable problem. Abnormal electrolytes: he did not take lasix. Denies leg cramps.  This is a stable problem. HTN: denies sob.  He takes other meds as rx'ed.  This is a stable problem. GOUT: no recent sxs of this.  This is a stable problem. Past Medical History:  Diagnosis Date  . ANEMIA 07/01/2009  . COUGH DUE TO ACE INHIBITORS 09/10/2008  . DM 06/12/2008  . FATTY LIVER DISEASE 06/12/2008  . Gout, unspecified 07/01/2009  . HYPERCHOLESTEROLEMIA 05/02/2007  . HYPERTENSION 08/17/2006  . HYPOTHYROIDISM 08/17/2006  . Mild renal insufficiency    w/u NEG  . NASH (nonalcoholic steatohepatitis)      Social History   Socioeconomic History  . Marital status: Single    Spouse name: Not on file  . Number of children: Not on file  . Years of education: Not on file  . Highest education level: Not on file  Occupational History  . Occupation: Production manager    Comment: for [employer]  Social Needs  . Financial resource strain: Not on file  . Food insecurity:    Worry: Not on file    Inability: Not on file  . Transportation needs:    Medical: Not on file    Non-medical: Not on file  Tobacco Use  . Smoking status: Never Smoker  . Smokeless tobacco: Never Used  Substance and Sexual Activity  . Alcohol use: Not on file  . Drug use: Not on file  . Sexual activity: Not on file  Lifestyle  . Physical activity:    Days per week: Not on file    Minutes per session: Not on file  . Stress: Not on file  Relationships  . Social connections:    Talks on phone: Not on file    Gets together: Not on file    Attends religious service: Not on file    Active member of club or organization: Not on file    Attends meetings of clubs or organizations:  Not on file    Relationship status: Not on file  . Intimate partner violence:    Fear of current or ex partner: Not on file    Emotionally abused: Not on file    Physically abused: Not on file    Forced sexual activity: Not on file  Other Topics Concern  . Not on file  Social History Narrative  . Not on file    Current Outpatient Medications on File Prior to Visit  Medication Sig Dispense Refill  . ACCU-CHEK SOFTCLIX LANCETS lancets Check Blood sugar 1 time per day.  Dx Code: E11.9 100 each 3  . amLODipine (NORVASC) 10 MG tablet Take 1 tablet (10 mg total) by mouth daily. 90 tablet 3  . aspirin 81 MG tablet Take 81 mg by mouth daily.      Marland Kitchen atorvastatin (LIPITOR) 10 MG tablet Take 1 tablet (10 mg total) by mouth daily. 90 tablet 3  . Fish Oil-Cholecalciferol (FISH OIL + D3 PO) Take by mouth.    Marland Kitchen glucose blood (ACCU-CHEK AVIVA PLUS) test strip Use to check blood sugar 1 time per day. Dx Code: E11.9 100 each 2  . losartan-hydrochlorothiazide (HYZAAR) 100-25 MG tablet  Take 1 tablet by mouth daily. 90 tablet 3  . metFORMIN (GLUCOPHAGE-XR) 500 MG 24 hr tablet Take 4 tablets (2,000 mg total) by mouth daily. 360 tablet 3  . metoprolol tartrate (LOPRESSOR) 50 MG tablet Take 1 tablet (50 mg total) by mouth 2 (two) times daily. 180 tablet 3  . Multiple Vitamins-Minerals (MULTI COMPLETE PO) Take by mouth.    . timolol (TIMOPTIC) 0.5 % ophthalmic solution      No current facility-administered medications on file prior to visit.     Allergies  Allergen Reactions  . Penicillins     REACTION: Rash  . Pioglitazone     REACTION: edema  . Tetracycline     REACTION: Rash    Family History  Problem Relation Age of Onset  . Cancer Mother        Breast Cancer  . Cancer Sister        Breast Cancer    BP 122/82   Pulse 71   Ht 5\' 9"  (1.753 m)   Wt 209 lb (94.8 kg)   SpO2 97%   BMI 30.86 kg/m    Review of Systems Denies numbness and chest pain.  No weight change.      Objective:     Physical Exam VITAL SIGNS:  See vs page GENERAL: no distress Ext: trace leg edema  Lab Results  Component Value Date   TSH 5.13 (H) 10/18/2017    Lab Results  Component Value Date   CREATININE 1.21 10/18/2017   BUN 18 10/18/2017   NA 140 10/18/2017   K 3.4 (L) 10/18/2017   CL 95 (L) 10/18/2017   CO2 38 (H) 10/18/2017      Assessment & Plan:  Edema: improved.  Plan is to follow this.  Abnormal electrolytes: I rx'ed KLOR HTN: well-controlled. GOUT: pt requests to reduce rx Hypothyroidism: he needs increased rx  Patient Instructions  I have sent these prescriptions to your pharmacy:  Please reduce the allopurinol to 1/2 pill per day, start potassium, and increase the levothyroxine to 137 mcg/day.   Best wishes with your new primary care provider.

## 2017-11-06 DIAGNOSIS — H04123 Dry eye syndrome of bilateral lacrimal glands: Secondary | ICD-10-CM | POA: Diagnosis not present

## 2017-11-06 DIAGNOSIS — H401131 Primary open-angle glaucoma, bilateral, mild stage: Secondary | ICD-10-CM | POA: Diagnosis not present

## 2017-11-06 DIAGNOSIS — H25813 Combined forms of age-related cataract, bilateral: Secondary | ICD-10-CM | POA: Diagnosis not present

## 2017-11-08 ENCOUNTER — Ambulatory Visit (INDEPENDENT_AMBULATORY_CARE_PROVIDER_SITE_OTHER): Payer: Medicare HMO | Admitting: Family Medicine

## 2017-11-08 ENCOUNTER — Encounter: Payer: Self-pay | Admitting: Family Medicine

## 2017-11-08 VITALS — BP 126/78 | HR 70 | Temp 98.6°F | Ht 69.0 in | Wt 205.2 lb

## 2017-11-08 DIAGNOSIS — N183 Chronic kidney disease, stage 3 unspecified: Secondary | ICD-10-CM

## 2017-11-08 DIAGNOSIS — E1159 Type 2 diabetes mellitus with other circulatory complications: Secondary | ICD-10-CM

## 2017-11-08 DIAGNOSIS — E785 Hyperlipidemia, unspecified: Secondary | ICD-10-CM | POA: Diagnosis not present

## 2017-11-08 DIAGNOSIS — E039 Hypothyroidism, unspecified: Secondary | ICD-10-CM

## 2017-11-08 DIAGNOSIS — Z23 Encounter for immunization: Secondary | ICD-10-CM | POA: Diagnosis not present

## 2017-11-08 DIAGNOSIS — I152 Hypertension secondary to endocrine disorders: Secondary | ICD-10-CM | POA: Insufficient documentation

## 2017-11-08 DIAGNOSIS — M1A9XX0 Chronic gout, unspecified, without tophus (tophi): Secondary | ICD-10-CM

## 2017-11-08 DIAGNOSIS — E1122 Type 2 diabetes mellitus with diabetic chronic kidney disease: Secondary | ICD-10-CM

## 2017-11-08 DIAGNOSIS — E1169 Type 2 diabetes mellitus with other specified complication: Secondary | ICD-10-CM | POA: Diagnosis not present

## 2017-11-08 DIAGNOSIS — I10 Essential (primary) hypertension: Secondary | ICD-10-CM

## 2017-11-08 DIAGNOSIS — Z6830 Body mass index (BMI) 30.0-30.9, adult: Secondary | ICD-10-CM

## 2017-11-08 NOTE — Progress Notes (Signed)
   Subjective:  Paul Banks is a 72 y.o. male who presents today with a chief complaint of T2DM and to establish care.   HPI:  T2DM, chronic problem Currently on metformin 1000 mg daily.  Tolerating well without side effects.  He has been trying to watch his diet lately.  Also try to work out more.  No reported polyuria or polydipsia.  Hypothyroidism Currently on Synthroid 137 mcg daily.  This was increased from 125 mcg daily a couple weeks ago.  He feels much better with this increased dose.  HTN Currently on Norvasc 5 mg daily, metoprolol 50 mg twice daily, and Hyzaar 100-25 daily.  Tolerating all these well without side effects.  No reported chest pain or shortness of breath.  HLD Current on Lipitor 10 mg daily.  No reported myalgias.  Gout  On allopurinol 50 mg daily tolerating well.  No gout flare for several years.  ROS: Per HPI  PMH: He reports that he has never smoked. He has never used smokeless tobacco. His alcohol and drug histories are not on file.  Objective:  Physical Exam: BP 126/78 (BP Location: Left Arm, Patient Position: Sitting, Cuff Size: Normal)   Pulse 70   Temp 98.6 F (37 C) (Oral)   Ht 5\' 9"  (1.753 m)   Wt 205 lb 3.2 oz (93.1 kg)   SpO2 97%   BMI 30.30 kg/m   Wt Readings from Last 3 Encounters:  11/08/17 205 lb 3.2 oz (93.1 kg)  10/20/17 209 lb (94.8 kg)  09/25/17 210 lb 3.2 oz (95.3 kg)  Gen: NAD, resting comfortably CV: RRR with no murmurs appreciated Pulm: NWOB, CTAB with no crackles, wheezes, or rhonchi GI: Normal bowel sounds present. Soft, Nontender, Nondistended. MSK: No edema, cyanosis, or clubbing noted Skin: Warm, dry Neuro: Grossly normal, moves all extremities Psych: Normal affect and thought content  Assessment/Plan:  Hypertension associated with diabetes (HCC) At goal.  Continue Norvasc 5 mg daily, Hyzaar 100-25 once daily, and metoprolol tartrate 50 mg twice daily.  Continue home blood pressure monitoring.  Continue with  lifestyle modifications.  Follow-up with me in 6 months.  Hopefully will be able to reduce medications as patient continues to lose weight.  Diabetes (HCC) A1c of 5.8 last month.  Continue metformin 1000 mg daily.  Follow-up in 6 months for repeat A1c.  Hypothyroidism Last TSH mildly elevated a few weeks ago.  Seems to be doing better on 137 mcg daily.  Will return in a couple months for repeat TSH testing.  Dyslipidemia associated with type 2 diabetes mellitus (HCC) LDL 65 earlier this month.  Continue atorvastatin 10 mg daily.  Gout Uric acid 3.6 earlier this month.  Continue allopurinol 50 mg daily.    BMI 30 Patient wishes to lose 20 to 30 pounds over the next year.  He has lost about 5 to 10 pounds over the last few months.  Congratulated him on this.  Encouraged continued lifestyle modifications including healthy diet and regular exercise.  Follow-up with me in 6 months.  Preventive health care Pneumovax given today.  Declined flu shot.  Katina Degree. Jimmey Ralph, MD 11/08/2017 9:25 AM

## 2017-11-08 NOTE — Patient Instructions (Signed)
It was very nice to see you today!  Keep up the good work!  No medication chagnes today.  We will give you your final pneumonia vaccine today.  Please ask your insurance about coverage for the Shingrix vaccine.  Come back to see me in 6 months, or sooner as needed.   Take care, Dr Jimmey Ralph

## 2017-11-08 NOTE — Assessment & Plan Note (Signed)
Last TSH mildly elevated a few weeks ago.  Seems to be doing better on 137 mcg daily.  Will return in a couple months for repeat TSH testing.

## 2017-11-08 NOTE — Assessment & Plan Note (Signed)
LDL 65 earlier this month.  Continue atorvastatin 10 mg daily.

## 2017-11-08 NOTE — Assessment & Plan Note (Signed)
A1c of 5.8 last month.  Continue metformin 1000 mg daily.  Follow-up in 6 months for repeat A1c.

## 2017-11-08 NOTE — Assessment & Plan Note (Signed)
Uric acid 3.6 earlier this month.  Continue allopurinol 50 mg daily.

## 2017-11-08 NOTE — Assessment & Plan Note (Signed)
At goal.  Continue Norvasc 5 mg daily, Hyzaar 100-25 once daily, and metoprolol tartrate 50 mg twice daily.  Continue home blood pressure monitoring.  Continue with lifestyle modifications.  Follow-up with me in 6 months.  Hopefully will be able to reduce medications as patient continues to lose weight.

## 2017-11-13 ENCOUNTER — Other Ambulatory Visit: Payer: Self-pay | Admitting: Endocrinology

## 2017-12-18 DIAGNOSIS — H04123 Dry eye syndrome of bilateral lacrimal glands: Secondary | ICD-10-CM | POA: Diagnosis not present

## 2017-12-18 DIAGNOSIS — H401131 Primary open-angle glaucoma, bilateral, mild stage: Secondary | ICD-10-CM | POA: Diagnosis not present

## 2017-12-18 DIAGNOSIS — H25813 Combined forms of age-related cataract, bilateral: Secondary | ICD-10-CM | POA: Diagnosis not present

## 2017-12-18 LAB — HM DIABETES EYE EXAM

## 2018-03-24 ENCOUNTER — Other Ambulatory Visit: Payer: Self-pay | Admitting: Endocrinology

## 2018-03-27 NOTE — Telephone Encounter (Signed)
Ok with me. Please place any necessary orders. 

## 2018-03-27 NOTE — Telephone Encounter (Signed)
Rx sent to pharmacy   

## 2018-05-09 ENCOUNTER — Ambulatory Visit (INDEPENDENT_AMBULATORY_CARE_PROVIDER_SITE_OTHER): Payer: Medicare HMO | Admitting: Family Medicine

## 2018-05-09 ENCOUNTER — Encounter: Payer: Self-pay | Admitting: Family Medicine

## 2018-05-09 VITALS — BP 130/76

## 2018-05-09 DIAGNOSIS — I10 Essential (primary) hypertension: Secondary | ICD-10-CM | POA: Diagnosis not present

## 2018-05-09 DIAGNOSIS — E1159 Type 2 diabetes mellitus with other circulatory complications: Secondary | ICD-10-CM | POA: Diagnosis not present

## 2018-05-09 DIAGNOSIS — M1A9XX0 Chronic gout, unspecified, without tophus (tophi): Secondary | ICD-10-CM | POA: Diagnosis not present

## 2018-05-09 DIAGNOSIS — E785 Hyperlipidemia, unspecified: Secondary | ICD-10-CM

## 2018-05-09 DIAGNOSIS — E1122 Type 2 diabetes mellitus with diabetic chronic kidney disease: Secondary | ICD-10-CM | POA: Diagnosis not present

## 2018-05-09 DIAGNOSIS — E039 Hypothyroidism, unspecified: Secondary | ICD-10-CM | POA: Diagnosis not present

## 2018-05-09 DIAGNOSIS — E1169 Type 2 diabetes mellitus with other specified complication: Secondary | ICD-10-CM | POA: Diagnosis not present

## 2018-05-09 DIAGNOSIS — N183 Chronic kidney disease, stage 3 (moderate): Secondary | ICD-10-CM | POA: Diagnosis not present

## 2018-05-09 NOTE — Assessment & Plan Note (Signed)
Doing well.  Continue Lipitor 10 mg daily.  Check lipid panel next blood draw.

## 2018-05-09 NOTE — Assessment & Plan Note (Signed)
Doing well.  Continue metformin 1000 mg daily.  Follow-up in 6 months recheck A1c-deferred for the time being due to COVID-19 pandemic.

## 2018-05-09 NOTE — Assessment & Plan Note (Signed)
Stable.  Continue allopurinol 50 mg daily.  Check uric acid with next blood draw.

## 2018-05-09 NOTE — Assessment & Plan Note (Signed)
Doing well.  Will stop amlodipine given good control.  Continue losartan-HCTZ 100-25 mg tablet once daily and Metroprolol tartrate 50 mg twice daily.  Continue home blood pressure monitoring with goal 140/90 or lower.  Continue to wean off medications as tolerated.

## 2018-05-09 NOTE — Assessment & Plan Note (Signed)
Stable.  Continue Synthroid 137 mcg daily.  Follow-up in 6 months check TSH.

## 2018-05-09 NOTE — Progress Notes (Signed)
    Chief Complaint:  Paul Banks is a 73 y.o. male who presents today for a virtual office visit with a chief complaint of T2DM follow up.   Assessment/Plan:  Diabetes (HCC) Doing well.  Continue metformin 1000 mg daily.  Follow-up in 6 months recheck A1c-deferred for the time being due to COVID-19 pandemic.  Hypothyroidism Stable.  Continue Synthroid 137 mcg daily.  Follow-up in 6 months check TSH.  Hypertension associated with diabetes (HCC) Doing well.  Will stop amlodipine given good control.  Continue losartan-HCTZ 100-25 mg tablet once daily and Metroprolol tartrate 50 mg twice daily.  Continue home blood pressure monitoring with goal 140/90 or lower.  Continue to wean off medications as tolerated.  Dyslipidemia associated with type 2 diabetes mellitus (HCC) Doing well.  Continue Lipitor 10 mg daily.  Check lipid panel next blood draw.  Gout Stable.  Continue allopurinol 50 mg daily.  Check uric acid with next blood draw.    Subjective:  HPI:  His stable, chronic medical conditions are outlined below:  # T2DM - On metformin 1000 mg daily.  Tolerating well without side effects.   - Home sugars in low 100s - ROS:  No reported polyuria or polydipsia.  # Hypothyroidism - On Synthroid 137 mcg daily.    # Hypertension - On Norvasc 5 mg daily, metoprolol 50 mg twice daily, and Hyzaar 100-25 daily.  Tolerating all these well without side effects.   -ROS: No reported chest pain or shortness of breath.  # Dyslipidemia - On Lipitor 10 mg daily.  No reported myalgias.  # Gout  - On allopurinol 50 mg daily tolerating well.  No gout flare for several years.   ROS: Per HPI  PMH: He reports that he has never smoked. He has never used smokeless tobacco. No history on file for alcohol and drug.      Objective/Observations  Physical Exam: Gen: NAD, resting comfortably Pulm: Normal work of breathing Neuro: Grossly normal, moves all extremities Psych: Normal affect and  thought content  Virtual Visit via Video   I connected with [name] on 05/09/18 at  8:40 AM EDT by a video enabled telemedicine application and verified that I am speaking with the correct person using two identifiers. I discussed the limitations of evaluation and management by telemedicine and the availability of in person appointments. The patient expressed understanding and agreed to proceed.  Patient was able to successfully connect however due to technical issues the encounter was then completed via audio only.  Patient location: Home Provider location: [facility] Persons participating in the virtual visit: Myself and Patient     Katina Degree. Jimmey Ralph, MD 05/09/2018 9:01 AM

## 2018-07-04 ENCOUNTER — Other Ambulatory Visit: Payer: Self-pay | Admitting: Endocrinology

## 2018-09-16 ENCOUNTER — Other Ambulatory Visit: Payer: Self-pay | Admitting: Endocrinology

## 2018-09-17 NOTE — Telephone Encounter (Signed)
Please forward refill request to pt's primary care provider.   

## 2018-09-19 LAB — HEMOGLOBIN A1C: Hemoglobin A1C: 7.1

## 2018-09-26 ENCOUNTER — Other Ambulatory Visit: Payer: Self-pay | Admitting: Endocrinology

## 2018-09-26 NOTE — Telephone Encounter (Signed)
Please forward refill request to pt's primary care provider.   

## 2018-09-26 NOTE — Telephone Encounter (Signed)
Please advise if you wish to refill or send to PCP 

## 2018-09-26 NOTE — Telephone Encounter (Signed)
Per Dr. Ellison's request, I am forwarding you this refill request. Please review and refill if appropriate 

## 2018-09-30 ENCOUNTER — Other Ambulatory Visit: Payer: Self-pay | Admitting: Endocrinology

## 2018-09-30 NOTE — Telephone Encounter (Signed)
Please forward refill request to pt's primary care provider.   

## 2018-10-01 ENCOUNTER — Other Ambulatory Visit: Payer: Self-pay | Admitting: Endocrinology

## 2018-10-01 NOTE — Telephone Encounter (Signed)
Please forward refill request to pt's primary care provider.   

## 2018-10-01 NOTE — Telephone Encounter (Signed)
Per Dr. Ellison's request, I am forwarding you this refill request. Please review and refill if appropriate 

## 2018-10-01 NOTE — Telephone Encounter (Signed)
Please advise 

## 2018-10-01 NOTE — Telephone Encounter (Signed)
Please advise if you are wanting to refill these medications. It appears from your last office note, you advised "best wishes on your new provider"

## 2018-10-11 ENCOUNTER — Other Ambulatory Visit: Payer: Self-pay

## 2018-10-28 ENCOUNTER — Other Ambulatory Visit: Payer: Self-pay | Admitting: Endocrinology

## 2018-10-28 NOTE — Telephone Encounter (Signed)
Please forward refill request to pt's primary care provider.   

## 2018-10-29 NOTE — Telephone Encounter (Signed)
Per Dr. Ellison's request, I am forwarding you this refill request. Please review and refill if appropriate 

## 2018-11-07 ENCOUNTER — Encounter: Payer: Medicare HMO | Admitting: Family Medicine

## 2018-11-07 ENCOUNTER — Ambulatory Visit: Payer: Medicare HMO

## 2018-11-20 ENCOUNTER — Other Ambulatory Visit: Payer: Self-pay | Admitting: Endocrinology

## 2018-11-20 NOTE — Telephone Encounter (Signed)
Per Dr. Ellison's request, I am forwarding this refill request. Please review and refill if appropriate.  

## 2018-11-20 NOTE — Telephone Encounter (Signed)
Please advise 

## 2018-11-20 NOTE — Telephone Encounter (Signed)
Please forward refill request to pt's primary care provider.   

## 2018-11-25 ENCOUNTER — Other Ambulatory Visit: Payer: Self-pay | Admitting: Family Medicine

## 2018-11-26 ENCOUNTER — Other Ambulatory Visit: Payer: Self-pay | Admitting: Family Medicine

## 2018-11-28 ENCOUNTER — Ambulatory Visit (INDEPENDENT_AMBULATORY_CARE_PROVIDER_SITE_OTHER): Payer: Medicare HMO | Admitting: Family Medicine

## 2018-11-28 ENCOUNTER — Other Ambulatory Visit: Payer: Self-pay

## 2018-11-28 ENCOUNTER — Encounter: Payer: Self-pay | Admitting: Family Medicine

## 2018-11-28 VITALS — BP 164/108 | HR 72 | Temp 98.0°F | Ht 69.0 in | Wt 210.2 lb

## 2018-11-28 DIAGNOSIS — E1122 Type 2 diabetes mellitus with diabetic chronic kidney disease: Secondary | ICD-10-CM

## 2018-11-28 DIAGNOSIS — E039 Hypothyroidism, unspecified: Secondary | ICD-10-CM

## 2018-11-28 DIAGNOSIS — E1169 Type 2 diabetes mellitus with other specified complication: Secondary | ICD-10-CM

## 2018-11-28 DIAGNOSIS — E669 Obesity, unspecified: Secondary | ICD-10-CM | POA: Diagnosis not present

## 2018-11-28 DIAGNOSIS — E785 Hyperlipidemia, unspecified: Secondary | ICD-10-CM | POA: Diagnosis not present

## 2018-11-28 DIAGNOSIS — Z0001 Encounter for general adult medical examination with abnormal findings: Secondary | ICD-10-CM

## 2018-11-28 DIAGNOSIS — Z6831 Body mass index (BMI) 31.0-31.9, adult: Secondary | ICD-10-CM | POA: Diagnosis not present

## 2018-11-28 DIAGNOSIS — N183 Chronic kidney disease, stage 3 unspecified: Secondary | ICD-10-CM | POA: Diagnosis not present

## 2018-11-28 DIAGNOSIS — M1A9XX0 Chronic gout, unspecified, without tophus (tophi): Secondary | ICD-10-CM | POA: Diagnosis not present

## 2018-11-28 DIAGNOSIS — Z125 Encounter for screening for malignant neoplasm of prostate: Secondary | ICD-10-CM

## 2018-11-28 DIAGNOSIS — E1159 Type 2 diabetes mellitus with other circulatory complications: Secondary | ICD-10-CM

## 2018-11-28 DIAGNOSIS — I10 Essential (primary) hypertension: Secondary | ICD-10-CM

## 2018-11-28 LAB — LIPID PANEL
Cholesterol: 150 mg/dL (ref 0–200)
HDL: 39.3 mg/dL (ref >39.00)
NonHDL: 110.2
Total CHOL/HDL Ratio: 4
Triglycerides: 203 mg/dL — ABNORMAL HIGH (ref 0.0–149.0)
VLDL: 40.6 mg/dL — ABNORMAL HIGH (ref 0.0–40.0)

## 2018-11-28 LAB — TSH: TSH: 1.49 u[IU]/mL (ref 0.35–4.50)

## 2018-11-28 LAB — COMPREHENSIVE METABOLIC PANEL
ALT: 17 U/L (ref 0–53)
AST: 20 U/L (ref 0–37)
Albumin: 4.6 g/dL (ref 3.5–5.2)
Alkaline Phosphatase: 62 U/L (ref 39–117)
BUN: 19 mg/dL (ref 6–23)
CO2: 30 mEq/L (ref 19–32)
Calcium: 10 mg/dL (ref 8.4–10.5)
Chloride: 96 mEq/L (ref 96–112)
Creatinine, Ser: 1.17 mg/dL (ref 0.40–1.50)
GFR: 61.37 mL/min (ref >60.00)
Glucose, Bld: 152 mg/dL — ABNORMAL HIGH (ref 70–99)
Potassium: 4.2 mEq/L (ref 3.5–5.1)
Sodium: 138 mEq/L (ref 135–145)
Total Bilirubin: 0.9 mg/dL (ref 0.2–1.2)
Total Protein: 7.8 g/dL (ref 6.0–8.3)

## 2018-11-28 LAB — CBC
HCT: 45.3 % (ref 39.0–52.0)
Hemoglobin: 15.5 g/dL (ref 13.0–17.0)
MCHC: 34.2 g/dL (ref 30.0–36.0)
MCV: 84.7 fl (ref 78.0–100.0)
Platelets: 283 10*3/uL (ref 150.0–400.0)
RBC: 5.35 Mil/uL (ref 4.22–5.81)
RDW: 15.3 % (ref 11.5–15.5)
WBC: 9.8 10*3/uL (ref 4.0–10.5)

## 2018-11-28 LAB — HEMOGLOBIN A1C: Hgb A1c MFr Bld: 6.2 % (ref 4.6–6.5)

## 2018-11-28 LAB — URIC ACID: Uric Acid, Serum: 4.9 mg/dL (ref 4.0–7.8)

## 2018-11-28 LAB — PSA: PSA: 1 ng/mL (ref 0.10–4.00)

## 2018-11-28 LAB — LDL CHOLESTEROL, DIRECT: Direct LDL: 83 mg/dL

## 2018-11-28 MED ORDER — METOPROLOL TARTRATE 50 MG PO TABS: 50.0000 mg | ORAL_TABLET | Freq: Two times a day (BID) | ORAL | 3 refills | Status: DC

## 2018-11-28 NOTE — Assessment & Plan Note (Signed)
Check A1c.  Continue Metformin 1000 mg twice daily. 

## 2018-11-28 NOTE — Patient Instructions (Signed)
It was very nice to see you today!  We will check blood work today.  Come back in 6 months to recheck your A1c, or sooner if needed.  Take care, Dr Jerline Pain  Please try these tips to maintain a healthy lifestyle:   Eat at least 3 REAL meals and 1-2 snacks per day.  Aim for no more than 5 hours between eating.  If you eat breakfast, please do so within one hour of getting up.    Obtain twice as many fruits/vegetables as protein or carbohydrate foods for both lunch and dinner. (Half of each meal should be fruits/vegetables, one quarter protein, and one quarter starchy carbs)   Cut down on sweet beverages. This includes juice, soda, and sweet tea.    Exercise at least 150 minutes every week.    Preventive Care 10 Years and Older, Male Preventive care refers to lifestyle choices and visits with your health care provider that can promote health and wellness. This includes:  A yearly physical exam. This is also called an annual well check.  Regular dental and eye exams.  Immunizations.  Screening for certain conditions.  Healthy lifestyle choices, such as diet and exercise. What can I expect for my preventive care visit? Physical exam Your health care provider will check:  Height and weight. These may be used to calculate body mass index (BMI), which is a measurement that tells if you are at a healthy weight.  Heart rate and blood pressure.  Your skin for abnormal spots. Counseling Your health care provider may ask you questions about:  Alcohol, tobacco, and drug use.  Emotional well-being.  Home and relationship well-being.  Sexual activity.  Eating habits.  History of falls.  Memory and ability to understand (cognition).  Work and work Statistician. What immunizations do I need?  Influenza (flu) vaccine  This is recommended every year. Tetanus, diphtheria, and pertussis (Tdap) vaccine  You may need a Td booster every 10 years. Varicella (chickenpox)  vaccine  You may need this vaccine if you have not already been vaccinated. Zoster (shingles) vaccine  You may need this after age 55. Pneumococcal conjugate (PCV13) vaccine  One dose is recommended after age 45. Pneumococcal polysaccharide (PPSV23) vaccine  One dose is recommended after age 75. Measles, mumps, and rubella (MMR) vaccine  You may need at least one dose of MMR if you were born in 1957 or later. You may also need a second dose. Meningococcal conjugate (MenACWY) vaccine  You may need this if you have certain conditions. Hepatitis A vaccine  You may need this if you have certain conditions or if you travel or work in places where you may be exposed to hepatitis A. Hepatitis B vaccine  You may need this if you have certain conditions or if you travel or work in places where you may be exposed to hepatitis B. Haemophilus influenzae type b (Hib) vaccine  You may need this if you have certain conditions. You may receive vaccines as individual doses or as more than one vaccine together in one shot (combination vaccines). Talk with your health care provider about the risks and benefits of combination vaccines. What tests do I need? Blood tests  Lipid and cholesterol levels. These may be checked every 5 years, or more frequently depending on your overall health.  Hepatitis C test.  Hepatitis B test. Screening  Lung cancer screening. You may have this screening every year starting at age 80 if you have a 30-pack-year history of smoking  and currently smoke or have quit within the past 15 years.  Colorectal cancer screening. All adults should have this screening starting at age 40 and continuing until age 35. Your health care provider may recommend screening at age 61 if you are at increased risk. You will have tests every 1-10 years, depending on your results and the type of screening test.  Prostate cancer screening. Recommendations will vary depending on your family  history and other risks.  Diabetes screening. This is done by checking your blood sugar (glucose) after you have not eaten for a while (fasting). You may have this done every 1-3 years.  Abdominal aortic aneurysm (AAA) screening. You may need this if you are a current or former smoker.  Sexually transmitted disease (STD) testing. Follow these instructions at home: Eating and drinking  Eat a diet that includes fresh fruits and vegetables, whole grains, lean protein, and low-fat dairy products. Limit your intake of foods with high amounts of sugar, saturated fats, and salt.  Take vitamin and mineral supplements as recommended by your health care provider.  Do not drink alcohol if your health care provider tells you not to drink.  If you drink alcohol: ? Limit how much you have to 0-2 drinks a day. ? Be aware of how much alcohol is in your drink. In the [place] one drink equals one 12 oz bottle of beer (355 mL), one 5 oz glass of wine (148 mL), or one 1 oz glass of hard liquor (44 mL). Lifestyle  Take daily care of your teeth and gums.  Stay active. Exercise for at least 30 minutes on 5 or more days each week.  Do not use any products that contain nicotine or tobacco, such as cigarettes, e-cigarettes, and chewing tobacco. If you need help quitting, ask your health care provider.  If you are sexually active, practice safe sex. Use a condom or other form of protection to prevent STIs (sexually transmitted infections).  Talk with your health care provider about taking a low-dose aspirin or statin. What's next?  Visit your health care provider once a year for a well check visit.  Ask your health care provider how often you should have your eyes and teeth checked.  Stay up to date on all vaccines. This information is not intended to replace advice given to you by your health care provider. Make sure you discuss any questions you have with your health care provider. Document Released:  01/23/2015 Document Revised: 12/21/2017 Document Reviewed: 12/21/2017 Elsevier Patient Education  2020 [employer].

## 2018-11-28 NOTE — Assessment & Plan Note (Signed)
Check TSH.  Continue Synthroid 137 mcg daily. 

## 2018-11-28 NOTE — Assessment & Plan Note (Signed)
No recent flares. Check Uric acid level.

## 2018-11-28 NOTE — Assessment & Plan Note (Signed)
Above goal.  Previously well controlled.  Has not taken his blood pressure medications this morning.  We will continue metoprolol tartrate 50 mg twice daily and losartan-HCTZ 100-25 once daily.  He will continue home monitoring with goal 140/90 or lower.  Discussed reasons to return to care or seek emergent care.

## 2018-11-28 NOTE — Progress Notes (Signed)
Chief Complaint:  Paul Banks is a 73 y.o. male who presents today for his annual comprehensive physical exam and subsequent medicare annual wellness visit.    Assessment/Plan:  Gout No recent flares. Check Uric acid level.   Dyslipidemia associated with type 2 diabetes mellitus (HCC) Check lipid panel.  Continue Lipitor 10 mg daily.  Hypothyroidism Check TSH.  Continue Synthroid 137 mcg daily.  Diabetes (HCC) Check A1c.  Continue Metformin 1000 mg twice daily.  Hypertension associated with diabetes ([place]) Above goal.  Previously well controlled.  Has not taken his blood pressure medications this morning.  We will continue metoprolol tartrate 50 mg twice daily and losartan-HCTZ 100-25 once daily.  He will continue home monitoring with goal 140/90 or lower.  Discussed reasons to return to care or seek emergent care.    Body mass index is 31.04 kg/m. / Obese BMI Metric Follow Up - 11/28/18 0852      BMI Metric Follow Up-Please document annually   BMI Metric Follow Up  Education provided       Preventative Healthcare: Up-to-date on colon cancer screening.  Due for next colonoscopy 2023.  Up-to-date on flu vaccine and pneumonia vaccines.  We will check PSA today.  Also check CBC, C met, TSH, and lipid panel.  Patient Counseling(The following topics were reviewed and/or handout was given):  -Nutrition: Stressed importance of moderation in sodium/caffeine intake, saturated fat and cholesterol, caloric balance, sufficient intake of fresh fruits, vegetables, and fiber.  -Stressed the importance of regular exercise.   -Substance Abuse: Discussed cessation/primary prevention of tobacco, alcohol, or other drug use; driving or other dangerous activities under the influence; availability of treatment for abuse.   -Injury prevention: Discussed safety belts, safety helmets, smoke detector, smoking near bedding or upholstery.   -Sexuality: Discussed sexually transmitted diseases, partner  selection, use of condoms, avoidance of unintended pregnancy and contraceptive alternatives.   -Dental health: Discussed importance of regular tooth brushing, flossing, and dental visits.  -Health maintenance and immunizations reviewed. Please refer to Health maintenance section.  During the course of the visit the patient was educated and counseled about appropriate screening and preventive services including:        Fall prevention   Nutrition Physical Activity Weight Management Cognition  Return to care in 1 year for next preventative visit.     Subjective:  HPI:  Health Risk Assessment: Patient considers his overall health to be good. He has no difficulty performing the following: . Preparing food and eating . Bathing  . Getting dressed . Using the toilet . Shopping . Managing Finances . Moving around from place to place  He has not had any falls within the past year.   Depression screen PHQ 2/9 11/28/2018  Decreased Interest 0  Down, Depressed, Hopeless 0  PHQ - 2 Score 0   Lifestyle Factors: Diet: Limiting carbs. Balanced plenty of fruits and vegetables.  Exercise: Walks 2-3 per day. Lifts weights.   Patient Care Team: Vivi Barrack, MD as PCP - General (Family Medicine) Calvert Cantor, MD as Consulting Physician (Ophthalmology)   His chronic medical conditions are outlined below:  # T2DM - On metformin 1000 mg daily.  Tolerating well without side effects.   - ROS:  No reported polyuria or polydipsia.  # Hypothyroidism - On Synthroid 137 mcg daily and tolerating well  # Hypertension - On metoprolol 50 mg twice daily, and Hyzaar 100-25 daily.  Tolerating all these well without side effects.   -ROS: No  reported chest pain or shortness of breath.  # Dyslipidemia - On Lipitor 10 mg daily.  No reported myalgias.  # Gout  - On allopurinol 50 mg daily tolerating well.  No gout flare for several years.  ROS: Per HPI, otherwise a complete review of  systems was negative.   PMH:  The following were reviewed and entered/updated in epic: Past Medical History:  Diagnosis Date  . ANEMIA 07/01/2009  . COUGH DUE TO ACE INHIBITORS 09/10/2008  . DM 06/12/2008  . FATTY LIVER DISEASE 06/12/2008  . Gout, unspecified 07/01/2009  . HYPERCHOLESTEROLEMIA 05/02/2007  . HYPERTENSION 08/17/2006  . HYPOTHYROIDISM 08/17/2006  . Mild renal insufficiency    w/u NEG  . NASH (nonalcoholic steatohepatitis)    Patient Active Problem List   Diagnosis Date Noted  . Hypertension associated with diabetes ([place]) 11/08/2017  . Diabetes ([place]) 03/04/2015  . Hypercalcemia 10/10/2012  . Renal insufficiency 10/10/2012  . Gout 07/01/2009  . COUGH DUE TO ACE INHIBITORS 09/10/2008  . NASH (nonalcoholic steatohepatitis) 06/12/2008  . Dyslipidemia associated with type 2 diabetes mellitus ([place]) 05/02/2007  . Hypothyroidism 08/17/2006   History reviewed. No pertinent surgical history.  Family History  Problem Relation Age of Onset  . Cancer Mother        Breast Cancer  . Cancer Sister        Breast Cancer    Medications- reviewed and updated Current Outpatient Medications  Medication Sig Dispense Refill  . ACCU-CHEK AVIVA PLUS test strip USE TO CHECK BLOOD SUGAR 1 TIME PER DAY 100 each 1  . ACCU-CHEK SOFTCLIX LANCETS lancets USE TO CHECK BLOOD SUGAR 1 TIME PER DAY 100 each 2  . allopurinol (ZYLOPRIM) 100 MG tablet Take 0.5 tablets (50 mg total) by mouth daily. 45 tablet 3  . aspirin 81 MG tablet Take 81 mg by mouth daily.      Marland Kitchen atorvastatin (LIPITOR) 10 MG tablet TAKE ONE TABLET BY MOUTH DAILY 30 tablet 1  . Fish Oil-Cholecalciferol (FISH OIL + D3 PO) Take by mouth.    . levothyroxine (SYNTHROID) 137 MCG tablet TAKE ONE TABLET BY MOUTH DAILY BEFORE BREAKFAST 81 tablet 2  . losartan-hydrochlorothiazide (HYZAAR) 100-25 MG tablet TAKE ONE TABLET BY MOUTH DAILY 85 tablet 2  . metFORMIN (GLUCOPHAGE-XR) 500 MG 24 hr tablet Take 2 tablets (1,000 mg total) by mouth 2 (two)  times daily. 360 tablet 3  . metoprolol tartrate (LOPRESSOR) 50 MG tablet Take 1 tablet (50 mg total) by mouth 2 (two) times daily. 180 tablet 3  . Multiple Vitamins-Minerals (MULTI COMPLETE PO) Take by mouth.    . potassium chloride (KLOR-CON) 8 MEQ tablet TAKE ONE TABLET BY MOUTH DAILY 81 tablet 2  . timolol (TIMOPTIC) 0.5 % ophthalmic solution      No current facility-administered medications for this visit.     Allergies-reviewed and updated Allergies  Allergen Reactions  . Penicillins     REACTION: Rash  . Pioglitazone     REACTION: edema  . Tetracycline     REACTION: Rash    Social History   Socioeconomic History  . Marital status: Single    Spouse name: Not on file  . Number of children: Not on file  . Years of education: Not on file  . Highest education level: Not on file  Occupational History  . Occupation: Banker    Comment: for [employer]  . Financial resource strain: Not on file  . Food insecurity    Worry: Not  on file    Inability: Not on file  . Transportation needs    Medical: Not on file    Non-medical: Not on file  Tobacco Use  . Smoking status: Never Smoker  . Smokeless tobacco: Never Used  Substance and Sexual Activity  . Alcohol use: Not on file  . Drug use: Not on file  . Sexual activity: Not on file  Lifestyle  . Physical activity    Days per week: Not on file    Minutes per session: Not on file  . Stress: Not on file  Relationships  . Social Herbalist on phone: Not on file    Gets together: Not on file    Attends religious service: Not on file    Active member of club or organization: Not on file    Attends meetings of clubs or organizations: Not on file    Relationship status: Not on file  Other Topics Concern  . Not on file  Social History Narrative  . Not on file        Objective:  Physical Exam: BP (!) 164/108   Pulse 72   Temp 98 F (36.7 C)   Ht '5\' 9"'$  (1.753 m)   Wt 210 lb 3.2 oz  (95.3 kg)   SpO2 99%   BMI 31.04 kg/m   Body mass index is 31.04 kg/m. Wt Readings from Last 3 Encounters:  11/28/18 210 lb 3.2 oz (95.3 kg)  11/08/17 205 lb 3.2 oz (93.1 kg)  10/20/17 209 lb (94.8 kg)   Gen: NAD, resting comfortably HEENT: TMs normal bilaterally. OP clear. No thyromegaly noted.  CV: RRR with no murmurs appreciated Pulm: NWOB, CTAB with no crackles, wheezes, or rhonchi GI: Normal bowel sounds present. Soft, Nontender, Nondistended. MSK: no edema, cyanosis, or clubbing noted Skin: warm, dry Neuro: CN2-12 grossly intact. Strength 5/5 in upper and lower extremities. Reflexes symmetric and intact bilaterally. Normal minicog.  Psych: Normal affect and thought content     Caleb M. Jerline Pain, MD 11/28/2018 8:56 AM

## 2018-11-28 NOTE — Assessment & Plan Note (Signed)
Check lipid panel.  Continue Lipitor 10 mg daily. 

## 2018-11-29 NOTE — Progress Notes (Signed)
Please inform patient of the following:  Blood sugar and cholesterol at goal.  All of his other labs are NORMAL. Would like for him to keep up the good work and continue working on diet and exercise.  Would like for him to continue all of his medications and we can recheck his blood sugar in 6 months.  Paul Banks. Jerline Pain, MD 11/29/2018 2:08 PM

## 2018-12-22 ENCOUNTER — Other Ambulatory Visit: Payer: Self-pay | Admitting: Endocrinology

## 2018-12-22 NOTE — Telephone Encounter (Signed)
Please forward refill request to pt's primary care provider.   

## 2018-12-24 ENCOUNTER — Other Ambulatory Visit: Payer: Self-pay | Admitting: Family Medicine

## 2018-12-24 NOTE — Telephone Encounter (Signed)
Per Dr. Ellison's request, I am forwarding this refill request. Please review and refill if appropriate.  

## 2018-12-24 NOTE — Telephone Encounter (Signed)
atorvastatin (LIPITOR) 10 MG tablet [    Patient requesting a refill with a 90 day supply.    Pharmacy:  [facility], [place] Phone:  [phone]  Fax:  [phone]

## 2018-12-25 MED ORDER — ATORVASTATIN CALCIUM 10 MG PO TABS: 10.0000 mg | ORAL_TABLET | Freq: Every day | ORAL | 1 refills | Status: DC

## 2019-01-23 DIAGNOSIS — H25813 Combined forms of age-related cataract, bilateral: Secondary | ICD-10-CM | POA: Diagnosis not present

## 2019-01-23 DIAGNOSIS — H43391 Other vitreous opacities, right eye: Secondary | ICD-10-CM | POA: Diagnosis not present

## 2019-01-23 DIAGNOSIS — H401131 Primary open-angle glaucoma, bilateral, mild stage: Secondary | ICD-10-CM | POA: Diagnosis not present

## 2019-01-23 DIAGNOSIS — H04123 Dry eye syndrome of bilateral lacrimal glands: Secondary | ICD-10-CM | POA: Diagnosis not present

## 2019-02-15 ENCOUNTER — Other Ambulatory Visit: Payer: Self-pay | Admitting: Family Medicine

## 2019-03-17 ENCOUNTER — Other Ambulatory Visit: Payer: Self-pay | Admitting: Family Medicine

## 2019-03-24 ENCOUNTER — Other Ambulatory Visit: Payer: Self-pay | Admitting: Family Medicine

## 2019-05-29 ENCOUNTER — Ambulatory Visit: Payer: Medicare HMO | Admitting: Family Medicine

## 2019-06-12 ENCOUNTER — Other Ambulatory Visit: Payer: Self-pay

## 2019-06-12 ENCOUNTER — Ambulatory Visit (INDEPENDENT_AMBULATORY_CARE_PROVIDER_SITE_OTHER): Payer: Medicare HMO | Admitting: Family Medicine

## 2019-06-12 ENCOUNTER — Encounter: Payer: Self-pay | Admitting: Family Medicine

## 2019-06-12 VITALS — BP 180/110 | HR 72 | Temp 98.4°F | Ht 69.0 in | Wt 215.2 lb

## 2019-06-12 DIAGNOSIS — I10 Essential (primary) hypertension: Secondary | ICD-10-CM | POA: Diagnosis not present

## 2019-06-12 DIAGNOSIS — E1159 Type 2 diabetes mellitus with other circulatory complications: Secondary | ICD-10-CM | POA: Diagnosis not present

## 2019-06-12 DIAGNOSIS — E1122 Type 2 diabetes mellitus with diabetic chronic kidney disease: Secondary | ICD-10-CM

## 2019-06-12 DIAGNOSIS — N183 Chronic kidney disease, stage 3 unspecified: Secondary | ICD-10-CM

## 2019-06-12 DIAGNOSIS — I152 Hypertension secondary to endocrine disorders: Secondary | ICD-10-CM

## 2019-06-12 LAB — POCT GLYCOSYLATED HEMOGLOBIN (HGB A1C): Hemoglobin A1C: 6.9 % — AB (ref 4.0–5.6)

## 2019-06-12 MED ORDER — AMLODIPINE BESYLATE 5 MG PO TABS: 5.0000 mg | ORAL_TABLET | Freq: Every day | ORAL | 3 refills | Status: DC

## 2019-06-12 NOTE — Assessment & Plan Note (Signed)
Elevated today.  Restart amlodipine 5 mg daily.  Reported Home blood pressure in the 140s over 80s range.  No reported chest pain or shortness of breath.  Will continue losartan-HCTZ 100-25 once daily and Metroprolol tartrate 50 mg twice daily as well.  He will check in with me in a few weeks.  Advised him to bring home blood pressure cuff to next office visit so that we can compare with ours.

## 2019-06-12 NOTE — Patient Instructions (Signed)
It was very nice to see you today!  Please start the amlodipine.  No other changes today.  I will see back in 6 months for your physical with blood work, or sooner if needed.  Please bring the blood pressure cuff to your next office visit.  Take care, Dr Jimmey Ralph  Please try these tips to maintain a healthy lifestyle:   Eat at least 3 REAL meals and 1-2 snacks per day.  Aim for no more than 5 hours between eating.  If you eat breakfast, please do so within one hour of getting up.    Each meal should contain half fruits/vegetables, one quarter protein, and one quarter carbs (no bigger than a computer mouse)   Cut down on sweet beverages. This includes juice, soda, and sweet tea.     Drink at least 1 glass of water with each meal and aim for at least 8 glasses per day   Exercise at least 150 minutes every week.

## 2019-06-12 NOTE — Progress Notes (Signed)
   Paul Banks is a 74 y.o. male who presents today for an office visit.  Assessment/Plan:  Chronic Problems Addressed Today: Diabetes (HCC) A1c stable at 6.9.  Continue Metformin 1000 mg twice daily.  Will recheck in 6 months at CPE.  Hypertension associated with diabetes (HCC) Elevated today.  Restart amlodipine 5 mg daily.  Reported Home blood pressure in the 140s over 80s range.  No reported chest pain or shortness of breath.  Will continue losartan-HCTZ 100-25 once daily and Metroprolol tartrate 50 mg twice daily as well.  He will check in with me in a few weeks.  Advised him to bring home blood pressure cuff to next office visit so that we can compare with ours.     Subjective:  HPI:  See A/p.         Objective:  Physical Exam: BP (!) 180/110   Pulse 72   Temp 98.4 F (36.9 C) (Temporal)   Ht 5\' 9"  (1.753 m)   Wt 215 lb 3.2 oz (97.6 kg)   SpO2 97%   BMI 31.78 kg/m   Gen: No acute distress, resting comfortably CV: Regular rate and rhythm with no murmurs appreciated Pulm: Normal work of breathing, clear to auscultation bilaterally with no crackles, wheezes, or rhonchi Neuro: Grossly normal, moves all extremities Psych: Normal affect and thought content      Paul Banks M. , MD 06/12/2019 10:26 AM

## 2019-06-12 NOTE — Assessment & Plan Note (Signed)
A1c stable at 6.9.  Continue Metformin 1000 mg twice daily.  Will recheck in 6 months at CPE.

## 2019-06-26 ENCOUNTER — Other Ambulatory Visit: Payer: Self-pay | Admitting: *Deleted

## 2019-06-26 MED ORDER — ALLOPURINOL 100 MG PO TABS: 50.0000 mg | ORAL_TABLET | Freq: Every day | ORAL | 2 refills | Status: DC

## 2019-08-07 ENCOUNTER — Telehealth: Payer: Self-pay | Admitting: Family Medicine

## 2019-08-07 NOTE — Progress Notes (Signed)
  Chronic Care Management   Note  08/07/2019 Name: [name] MRN: [id number] DOB: [birth year]  Paul Banks is a 74 y.o. year old male who is a primary care patient of Jimmey Ralph, Katina Degree, MD. I reached out to Paul Banks by phone today in response to a referral sent by Paul Banks's PCP, Ardith Dark, MD.   Paul Banks was given information about Chronic Care Management services today including:  1. CCM service includes personalized support from designated clinical staff supervised by his physician, including individualized plan of care and coordination with other care providers 2. 24/7 contact phone numbers for assistance for urgent and routine care needs. 3. Service will only be billed when office clinical staff spend 20 minutes or more in a month to coordinate care. 4. Only one practitioner may furnish and bill the service in a calendar month. 5. The patient may stop CCM services at any time (effective at the end of the month) by phone call to the office staff.   Patient agreed to services and verbal consent obtained.   Follow up plan:   Lynnae January [facility] Scheduler

## 2019-08-14 DIAGNOSIS — H401131 Primary open-angle glaucoma, bilateral, mild stage: Secondary | ICD-10-CM | POA: Diagnosis not present

## 2019-08-14 DIAGNOSIS — H04123 Dry eye syndrome of bilateral lacrimal glands: Secondary | ICD-10-CM | POA: Diagnosis not present

## 2019-08-14 DIAGNOSIS — H25813 Combined forms of age-related cataract, bilateral: Secondary | ICD-10-CM | POA: Diagnosis not present

## 2019-08-14 DIAGNOSIS — H43391 Other vitreous opacities, right eye: Secondary | ICD-10-CM | POA: Diagnosis not present

## 2019-08-25 ENCOUNTER — Other Ambulatory Visit: Payer: Self-pay | Admitting: Family Medicine

## 2019-08-26 ENCOUNTER — Ambulatory Visit: Payer: Medicare HMO

## 2019-08-26 VITALS — BP 138/87

## 2019-08-26 DIAGNOSIS — N183 Chronic kidney disease, stage 3 unspecified: Secondary | ICD-10-CM

## 2019-08-26 DIAGNOSIS — E1169 Type 2 diabetes mellitus with other specified complication: Secondary | ICD-10-CM

## 2019-08-26 DIAGNOSIS — E1159 Type 2 diabetes mellitus with other circulatory complications: Secondary | ICD-10-CM

## 2019-08-26 NOTE — Progress Notes (Signed)
Chronic Care Management Pharmacy  Name: Paul Banks  MRN: [id number] DOB: 25-Aug-1945  Chief Complaint/ HPI  Paul Banks,  74 y.o. , male presents for their Initial CCM visit with the clinical pharmacist via telephone due to COVID-19 Pandemic.  After restarting amlodipine 5 mg once daily home BPs have been 130s/80s. Today provided reading of 138/87.   Metformin XR dose currently 500 mg every morning, 1000 mg every evening. FBG running 97-112, currently testing 1-2x/week. Has been focusing on portion control for diet, current exercise is strength training 2-3x/week and walking 3 miles 3x/week.   PCP : Vivi Barrack, MD  Chronic conditions include:  Encounter Diagnoses  Name Primary?  . Hypertension associated with diabetes ([place]) Yes  . Type 2 diabetes mellitus with stage 3 chronic kidney disease, without long-term current use of insulin, unspecified whether stage 3a or 3b CKD ([place])   . Dyslipidemia associated with type 2 diabetes mellitus ([place])     Office Visits:  06/12/2019 (PCP): Restart amlodipine 5 mg daily.  Reported Home blood pressure in the 140s over 80s range.  No reported chest pain or shortness of breath.  Will continue losartan-HCTZ 100-25 once daily and Metroprolol tartrate 50 mg twice daily as well.  Patient Active Problem List   Diagnosis Date Noted  . Hypertension associated with diabetes ([place]) 11/08/2017  . Diabetes ([place]) 03/04/2015  . Hypercalcemia 10/10/2012  . Renal insufficiency 10/10/2012  . Gout 07/01/2009  . COUGH DUE TO ACE INHIBITORS 09/10/2008  . NASH (nonalcoholic steatohepatitis) 06/12/2008  . Dyslipidemia associated with type 2 diabetes mellitus ([place]) 05/02/2007  . Hypothyroidism 08/17/2006   No past surgical history on file. Social History   Socioeconomic History  . Marital status: Single    Spouse name: Not on file  . Number of children: Not on file  . Years of education: Not on file  . Highest education level: Not on file   Occupational History  . Occupation: Banker    Comment: for [employer]  . Smoking status: Never Smoker  . Smokeless tobacco: Never Used  Substance and Sexual Activity  . Alcohol use: Not on file  . Drug use: Not on file  . Sexual activity: Not on file  Other Topics Concern  . Not on file  Social History Narrative  . Not on file   Social Determinants of Health   Financial Resource Strain:   . Difficulty of Paying Living Expenses:   Food Insecurity: No Food Insecurity  . Worried About Charity fundraiser in the Last Year: Never true  . Ran Out of Food in the Last Year: Never true  Transportation Needs: No Transportation Needs  . Lack of Transportation (Medical): No  . Lack of Transportation (Non-Medical): No  Physical Activity:   . Days of Exercise per Week:   . Minutes of Exercise per Session:   Stress:   . Feeling of Stress :   Social Connections:   . Frequency of Communication with Friends and Family:   . Frequency of Social Gatherings with Friends and Family:   . Attends Religious Services:   . Active Member of Clubs or Organizations:   . Attends Archivist Meetings:   Marland Kitchen Marital Status:   SDOH (Social Determinants of Health) assessments performed: Yes. Family History  Problem Relation Age of Onset  . Cancer Mother        Breast Cancer  . Cancer Sister  Breast Cancer   Allergies  Allergen Reactions  . Penicillins     REACTION: Rash  . Pioglitazone     REACTION: edema  . Tetracycline     REACTION: Rash   Outpatient Encounter Medications as of 08/26/2019  Medication Sig  . ACCU-CHEK AVIVA PLUS test strip USE TO CHECK BLOOD SUGAR 1 TIME PER DAY  . ACCU-CHEK SOFTCLIX LANCETS lancets USE TO CHECK BLOOD SUGAR 1 TIME PER DAY  . allopurinol (ZYLOPRIM) 100 MG tablet Take 0.5 tablets (50 mg total) by mouth daily.  Marland Kitchen amLODipine (NORVASC) 5 MG tablet Take 1 tablet (5 mg total) by mouth daily.  Marland Kitchen aspirin 81 MG tablet Take 81 mg by  mouth daily.    Marland Kitchen atorvastatin (LIPITOR) 10 MG tablet TAKE ONE TABLET BY MOUTH DAILY  . Fish Oil-Cholecalciferol (FISH OIL + D3 PO) Take by mouth.  . levothyroxine (SYNTHROID) 137 MCG tablet TAKE ONE TABLET BY MOUTH DAILY BEFORE BREAKFAST  . losartan-hydrochlorothiazide (HYZAAR) 100-25 MG tablet TAKE ONE TABLET BY MOUTH DAILY  . metFORMIN (GLUCOPHAGE-XR) 500 MG 24 hr tablet Take 2 tablets (1,000 mg total) by mouth 2 (two) times daily.  . metoprolol tartrate (LOPRESSOR) 50 MG tablet Take 1 tablet (50 mg total) by mouth 2 (two) times daily.  . Multiple Vitamins-Minerals (MULTI COMPLETE PO) Take by mouth.  . potassium chloride (KLOR-CON) 8 MEQ tablet TAKE ONE TABLET BY MOUTH DAILY  . timolol (TIMOPTIC) 0.5 % ophthalmic solution    No facility-administered encounter medications on file as of 08/26/2019.   Patient Care Team    Relationship Specialty Notifications Start End  Ardith Dark, MD PCP - General Family Medicine  11/08/17   Nelson Chimes, MD Consulting Physician Ophthalmology  10/01/14   Dahlia Byes, [facility] Pharmacist Pharmacist  08/07/19    Comment: [phone]   Current Diagnosis/Assessment: Goals Addressed            This Visit's Progress   . PharmD Care Plan       CARE PLAN ENTRY (see longitudinal plan of care for additional care plan information)  Current Barriers:  . Chronic Disease Management support, education, and care coordination needs related to Hypertension, Hyperlipidemia, and Diabetes.   Hypertension BP Readings from Last 3 Encounters:  08/26/19 138/87  06/12/19 (!) 180/110  11/28/18 (!) 164/108   . Pharmacist Clinical Goal(s): o Over the next 180 days, patient will work with PharmD and providers to achieve BP goal <130/80 . Current regimen:  . Losartan-hydrochlorothiazide 100-25 mg once daily . Metoprolol tartrate 50 mg twice daily . Amlodipine 5 mg once daily . Interventions: o Home BP monitoring o Diet recommendations . Patient self care activities  - Over the next 180 days, patient will: o Check BP at least once every 1-2 weeks, document, and provide at future appointments o Ensure daily salt intake < 2300 mg/day  Hyperlipidemia Lab Results  Component Value Date/Time   LDLCALC 65 10/18/2017 08:47 AM   LDLDIRECT 83.0 11/28/2018 08:55 AM   . Pharmacist Clinical Goal(s): o Over the next 180 days, patient will work with PharmD and providers to achieve LDL goal < 70 . Current regimen:  o Atorvastatin 10 mg once daily  . Interventions: o Diet/exercise recommendations . Patient self care activities - Over the next 180 days, patient will: o Continue with diet/exercise recommendations  Diabetes Lab Results  Component Value Date/Time   HGBA1C 6.9 (A) 06/12/2019 09:57 AM   HGBA1C 6.2 11/28/2018 08:55 AM   HGBA1C 7.1 09/10/2018 12:00 AM  HGBA1C 5.8 (A) 09/25/2017 01:18 PM   HGBA1C 5.9 04/23/2014 09:20 AM   . Pharmacist Clinical Goal(s): o Over the next 180 days, patient will work with PharmD and providers to maintain A1c goal <7% . Current regimen:  o Metformin XR 500 mg every morning, 1000 mg in evening . Interventions: o Blood glucose monitoring . Patient self care activities - Over the next 180 days, patient will: o Check blood sugar as directed, document, and provide at future appointments o Contact provider with any episodes of hypoglycemia  Medication management . Pharmacist Clinical Goal(s): o Over the next 180 days, patient will work with PharmD and providers to maintain optimal medication adherence . [facility] . Interventions o Comprehensive medication review performed. o Continue current medication management strategy . Patient self care activities - Over the next 180 days, patient will: o Focus on medication adherence - Continue current management o Report any questions or concerns to PharmD and/or provider(s)  Initial goal documentation       Hypertension   BP goal <130/80  BP  Readings from Last 3 Encounters:  08/26/19 138/87  06/12/19 (!) 180/110  11/28/18 (!) 164/108   Patient has failed these meds in the past: n/a Patient checks BP at home 1-2x per week. Patient home BP readings are ranging: 130s/80s  Patient is currently above goal on the following medications:  . Losartan-hydrochlorothiazide 100-25 mg once daily . Metoprolol tartrate 50 mg twice daily . Amlodipine 5 mg once daily  We discussed diet and exercise extensively.  Plan  Continue current medications and control with diet and exercise.  CPA f/u on BP 09/2019.   Diabetes   A1c goal < 7%  Lab Results  Component Value Date/Time   HGBA1C 6.9 (A) 06/12/2019 09:57 AM   HGBA1C 6.2 11/28/2018 08:55 AM   HGBA1C 7.1 09/10/2018 12:00 AM   HGBA1C 5.8 (A) 09/25/2017 01:18 PM   HGBA1C 5.9 04/23/2014 09:20 AM   MICROALBUR 1.7 10/18/2017 08:47 AM   MICROALBUR 3.4 (H) 07/06/2016 11:39 AM   Checking BG: Weekly. Recent FBG readings 97-112. Patient is currently at goal on the following medications:  Marland Kitchen Metformin XR 500 mg every morning and 1000 mg every evening  We discussed: diet and exercise extensively.  Plan  Consider high intensity statin 40 mg once daily.  Continue current medications and control with diet and exercise.  Hyperlipidemia   LDL goal < 70     Component Value Date/Time   CHOL 150 11/28/2018 0855   TRIG 203.0 (H) 11/28/2018 0855   TRIG 400 (HH) 01/16/2006 0805   HDL 39.30 11/28/2018 0855   LDLCALC 65 10/18/2017 0847   LDLDIRECT 83.0 11/28/2018 0855    Hepatic Function Latest Ref Rng & Units 11/28/2018 10/18/2017 07/06/2016  Total Protein 6.0 - 8.3 g/dL 7.8 7.7 7.7  Albumin 3.5 - 5.2 g/dL 4.6 4.5 4.6  AST 0 - 37 U/L '20 17 16  '$ ALT 0 - 53 U/L '17 15 18  '$ Alk Phosphatase 39 - 117 U/L 62 60 65  Total Bilirubin 0.2 - 1.2 mg/dL 0.9 1.1 0.8  Bilirubin, Direct 0.0 - 0.3 mg/dL - 0.2 0.1    The 10-year ASCVD risk score Mikey Bussing DC Jr., et al., 2013) is: 44%   Values used to  calculate the score:     Age: 63 years     Sex: Male     Is Non-Hispanic African American: No     Diabetic: Yes     Tobacco  smoker: No     Systolic Blood Pressure: 583 mmHg     Is BP treated: Yes     HDL Cholesterol: 39.3 mg/dL     Total Cholesterol: 150 mg/dL   Diabetes, HTN, ASCVD 44%. BMI ~31, CKD. Patient is currently above goal on the following medications:  . Atorvastatin 10 mg once daily    We discussed:  diet and exercise extensively. BMI ~31. Current weight 208 lb.   Plan  Continue current medications and control with diet and exercise.  Hypothyroidism   Lab Results  Component Value Date/Time   TSH 1.49 11/28/2018 08:55 AM   TSH 5.13 (H) 10/18/2017 08:47 AM   TSH normal. Patient is currently controlled on the following medications:  . Levothyroxine 137 mcg once daily before breakfast  We discussed: proper administration of levothyroxine.   Plan  Continue current medications.  Gout   Patient has failed these meds in past: n/a. Patient is currently controlled on the following medications:  . Allopurinol 50 mg once daily  We discussed:  Low purine diet. No issues with gout recently, originally started on 300 mg daily. Goal to taper off entirely.  Plan  Continue current medications  Vaccines   Immunization History  Administered Date(s) Administered  . Hepatitis A, Adult 09/24/2018  . Influenza, Quadrivalent, Recombinant, Inj, Pf 12/02/2018  . Influenza-Unspecified 10/10/2012  . Janssen (J&J) SARS-COV-2 Vaccination 03/22/2019  . Pneumococcal Conjugate-13 11/13/2013  . Pneumococcal Polysaccharide-23 09/08/2010, 11/08/2017  . Td 01/10/2002  . Tdap 11/13/2013  . Zoster 03/04/2015  . Zoster Recombinat (Shingrix) 09/24/2018, 12/02/2018   Reviewed and discussed patient's vaccination history.    Plan  No recommendations.  Medication Management Coordination   Receives prescription medications from:  [facility] [address]  [place] [address] Phone: [phone] Fax: [phone]   Continue current management  Plan  Continue current medication management strategy. ___________________________  Future Appointments  Date Time Provider [place]  11/26/2019 10:30 AM [facility] CCM PHARMACIST [facility]  12/11/2019  8:20 AM Vivi Barrack, MD [facility]   Visit follow-up:  . CMA follow-up: 9/1 BP call + check on weight loss, current weight 208, wants to get closer to 185 with improved diet, exercise.  Marland Kitchen RPH follow-up: 3 month phone visit.  Madelin Rear, Pharm.D., BCGP Clinical Pharmacist [place] [phone]

## 2019-08-26 NOTE — Patient Instructions (Addendum)
Please call me at [phone] (direct line) with any questions - thank you!  - Paul Banks., Clinical Pharmacist  Goals Addressed            This Visit's Progress   . PharmD Care Plan       CARE PLAN ENTRY (see longitudinal plan of care for additional care plan information)  Current Barriers:  . Chronic Disease Management support, education, and care coordination needs related to Hypertension, Hyperlipidemia, and Diabetes.   Hypertension BP Readings from Last 3 Encounters:  08/26/19 138/87  06/12/19 (!) 180/110  11/28/18 (!) 164/108   . Pharmacist Clinical Goal(s): o Over the next 180 days, patient will work with PharmD and providers to achieve BP goal <130/80 . Current regimen:  . Losartan-hydrochlorothiazide 100-25 mg once daily . Metoprolol tartrate 50 mg twice daily . Amlodipine 5 mg once daily . Interventions: o Home BP monitoring o Diet recommendations . Patient self care activities - Over the next 180 days, patient will: o Check BP at least once every 1-2 weeks, document, and provide at future appointments o Ensure daily salt intake < 2300 mg/day  Hyperlipidemia Lab Results  Component Value Date/Time   LDLCALC 65 10/18/2017 08:47 AM   LDLDIRECT 83.0 11/28/2018 08:55 AM   . Pharmacist Clinical Goal(s): o Over the next 180 days, patient will work with PharmD and providers to achieve LDL goal < 70 . Current regimen:  o Atorvastatin 10 mg once daily  . Interventions: o Diet/exercise recommendations . Patient self care activities - Over the next 180 days, patient will: o Continue with diet/exercise recommendations  Diabetes Lab Results  Component Value Date/Time   HGBA1C 6.9 (A) 06/12/2019 09:57 AM   HGBA1C 6.2 11/28/2018 08:55 AM   HGBA1C 7.1 09/10/2018 12:00 AM   HGBA1C 5.8 (A) 09/25/2017 01:18 PM   HGBA1C 5.9 04/23/2014 09:20 AM   . Pharmacist Clinical Goal(s): o Over the next 180 days, patient will work with PharmD and providers to maintain A1c goal  <7% . Current regimen:  o Metformin XR 500 mg every morning, 1000 mg in evening . Interventions: o Blood glucose monitoring . Patient self care activities - Over the next 180 days, patient will: o Check blood sugar as directed, document, and provide at future appointments o Contact provider with any episodes of hypoglycemia  Medication management . Pharmacist Clinical Goal(s): o Over the next 180 days, patient will work with PharmD and providers to maintain optimal medication adherence . [facility] . Interventions o Comprehensive medication review performed. o Continue current medication management strategy . Patient self care activities - Over the next 180 days, patient will: o Focus on medication adherence - Continue current management o Report any questions or concerns to PharmD and/or provider(s)  Initial goal documentation       Paul Banks was given information about Chronic Care Management services today including:  1. CCM service includes personalized support from designated clinical staff supervised by his physician, including individualized plan of care and coordination with other care providers 2. 24/7 contact phone numbers for assistance for urgent and routine care needs. 3. Standard insurance, coinsurance, copays and deductibles apply for chronic care management only during months in which we provide at least 20 minutes of these services. Most insurances cover these services at 100%, however patients may be responsible for any copay, coinsurance and/or deductible if applicable. This service may help you avoid the need for more expensive face-to-face services. 4. Only one practitioner may furnish and  bill the service in a calendar month. 5. The patient may stop CCM services at any time (effective at the end of the month) by phone call to the office staff.  Patient agreed to services and verbal consent obtained.   The patient verbalized understanding of  instructions provided today and agreed to receive a mailed copy of patient instruction and/or educational materials. Telephone follow up appointment with pharmacy team member scheduled for: See next appointment with "Care Management Staff" under "What's Next" below.   Paul Banks, Pharm.D., BCGP Clinical Pharmacist [facility] [phone]  Exercising to Lose Weight Exercise is structured, repetitive physical activity to improve fitness and health. Getting regular exercise is important for everyone. It is especially important if you are overweight. Being overweight increases your risk of heart disease, stroke, diabetes, high blood pressure, and several types of cancer. Reducing your calorie intake and exercising can help you lose weight. Exercise is usually categorized as moderate or vigorous intensity. To lose weight, most people need to do a certain amount of moderate-intensity or vigorous-intensity exercise each week. Moderate-intensity exercise  Moderate-intensity exercise is any activity that gets you moving enough to burn at least three times more energy (calories) than if you were sitting. Examples of moderate exercise include:  Walking a mile in 15 minutes.  Doing light yard work.  Biking at an easy pace. Most people should get at least 150 minutes (2 hours and 30 minutes) a week of moderate-intensity exercise to maintain their body weight. Vigorous-intensity exercise Vigorous-intensity exercise is any activity that gets you moving enough to burn at least six times more calories than if you were sitting. When you exercise at this intensity, you should be working hard enough that you are not able to carry on a conversation. Examples of vigorous exercise include:  Running.  Playing a team sport, such as football, basketball, and soccer.  Jumping rope. Most people should get at least 75 minutes (1 hour and 15 minutes) a week of  vigorous-intensity exercise to maintain their body weight. How can exercise affect me? When you exercise enough to burn more calories than you eat, you lose weight. Exercise also reduces body fat and builds muscle. The more muscle you have, the more calories you burn. Exercise also:  Improves mood.  Reduces stress and tension.  Improves your overall fitness, flexibility, and endurance.  Increases bone strength. The amount of exercise you need to lose weight depends on:  Your age.  The type of exercise.  Any health conditions you have.  Your overall physical ability. Talk to your health care provider about how much exercise you need and what types of activities are safe for you. What actions can I take to lose weight? Nutrition   Make changes to your diet as told by your health care provider or diet and nutrition specialist (dietitian). This may include: ? Eating fewer calories. ? Eating more protein. ? Eating less unhealthy fats. ? Eating a diet that includes fresh fruits and vegetables, whole grains, low-fat dairy products, and lean protein. ? Avoiding foods with added fat, salt, and sugar.  Drink plenty of water while you exercise to prevent dehydration or heat stroke. Activity  Choose an activity that you enjoy and set realistic goals. Your health care provider can help you make an exercise plan that works for you.  Exercise at a moderate or vigorous intensity most days of the week. ? The intensity of exercise may vary from person to  person. You can tell how intense a workout is for you by paying attention to your breathing and heartbeat. Most people will notice their breathing and heartbeat get faster with more intense exercise.  Do resistance training twice each week, such as: ? Push-ups. ? Sit-ups. ? Lifting weights. ? Using resistance bands.  Getting short amounts of exercise can be just as helpful as long structured periods of exercise. If you have trouble  finding time to exercise, try to include exercise in your daily routine. ? Get up, stretch, and walk around every 30 minutes throughout the day. ? Go for a walk during your lunch break. ? Park your car farther away from your destination. ? If you take public transportation, get off one stop early and walk the rest of the way. ? Make phone calls while standing up and walking around. ? Take the stairs instead of elevators or escalators.  Wear comfortable clothes and shoes with good support.  Do not exercise so much that you hurt yourself, feel dizzy, or get very short of breath. Where to find more information  U.S. Department of Health and Human Services: ThisPath.fi  Centers for Disease Control and Prevention (CDC): FootballExhibition.com.br Contact a health care provider:  Before starting a new exercise program.  If you have questions or concerns about your weight.  If you have a medical problem that keeps you from exercising. Get help right away if you have any of the following while exercising:  Injury.  Dizziness.  Difficulty breathing or shortness of breath that does not go away when you stop exercising.  Chest pain.  Rapid heartbeat. Summary  Being overweight increases your risk of heart disease, stroke, diabetes, high blood pressure, and several types of cancer.  Losing weight happens when you burn more calories than you eat.  Reducing the amount of calories you eat in addition to getting regular moderate or vigorous exercise each week helps you lose weight. This information is not intended to replace advice given to you by your health care provider. Make sure you discuss any questions you have with your health care provider. Document Revised: 01/09/2017 Document Reviewed: 01/09/2017 Elsevier Patient Education  2020 [employer].

## 2019-08-30 ENCOUNTER — Other Ambulatory Visit: Payer: Self-pay | Admitting: Family Medicine

## 2019-09-18 ENCOUNTER — Other Ambulatory Visit: Payer: Self-pay | Admitting: *Deleted

## 2019-09-18 ENCOUNTER — Telehealth: Payer: Self-pay | Admitting: Family Medicine

## 2019-09-18 MED ORDER — LOSARTAN POTASSIUM-HCTZ 100-25 MG PO TABS: 1.0000 | ORAL_TABLET | Freq: Every day | ORAL | 0 refills | Status: DC

## 2019-09-18 MED ORDER — LEVOTHYROXINE SODIUM 137 MCG PO TABS: ORAL_TABLET | ORAL | 0 refills | Status: DC

## 2019-09-18 NOTE — Telephone Encounter (Signed)
Refills send to pharmacy  

## 2019-09-18 NOTE — Telephone Encounter (Signed)
.   LAST APPOINTMENT DATE: 08/25/2019   NEXT APPOINTMENT DATE:@11 /16/2021  MEDICATION:losartan-hydrochlorothiazide (HYZAAR) 100-25 MG tablet  levothyroxine (SYNTHROID) 137 MCG tablet  PHARMACY:[facility] [address] - [place], [place] - [address]  **Let patient know to contact pharmacy at the end of the day to make sure medication is ready. **  ** Please notify patient to allow 48-72 hours to process**  **Encourage patient to contact the pharmacy for refills or they can request refills through [facility]**  CLINICAL FILLS OUT ALL BELOW:   LAST REFILL:  QTY:  REFILL DATE:    OTHER COMMENTS:    Okay for refill?  Please advise

## 2019-11-26 ENCOUNTER — Telehealth: Payer: Self-pay

## 2019-11-26 ENCOUNTER — Ambulatory Visit: Payer: Medicare HMO

## 2019-11-26 ENCOUNTER — Other Ambulatory Visit: Payer: Self-pay | Admitting: Family Medicine

## 2019-11-26 DIAGNOSIS — N183 Chronic kidney disease, stage 3 unspecified: Secondary | ICD-10-CM

## 2019-11-26 DIAGNOSIS — E1159 Type 2 diabetes mellitus with other circulatory complications: Secondary | ICD-10-CM

## 2019-11-26 DIAGNOSIS — E1122 Type 2 diabetes mellitus with diabetic chronic kidney disease: Secondary | ICD-10-CM

## 2019-11-26 DIAGNOSIS — I152 Hypertension secondary to endocrine disorders: Secondary | ICD-10-CM

## 2019-11-26 MED ORDER — METOPROLOL TARTRATE 50 MG PO TABS: 50.0000 mg | ORAL_TABLET | Freq: Two times a day (BID) | ORAL | 3 refills | Status: DC

## 2019-11-26 NOTE — Patient Instructions (Signed)
Please review care plan below and call me at [phone] with any questions!  Thank you, [name]., Clinical Pharmacist  Goals Addressed            This Visit's Progress    PharmD Care Plan   On track    CARE PLAN ENTRY (see longitudinal plan of care for additional care plan information)  Current Barriers:   Chronic Disease Management support, education, and care coordination needs related to Hypertension, Hyperlipidemia, and Diabetes.   Hypertension BP Readings from Last 3 Encounters:  08/26/19 138/87  06/12/19 (!) 180/110  11/28/18 (!) 164/108    Pharmacist Clinical Goal(s): o Over the next 180 days, patient will work with PharmD and providers to maintain BP goal <140/90  Current regimen:   Losartan-hydrochlorothiazide 100-25 mg once daily  Metoprolol tartrate 50 mg twice daily  Amlodipine 5 mg once daily  Interventions: o Home BP monitoring o Diet recommendations  Patient self care activities - Over the next 180 days, patient will: o Check BP at least once every 1-2 weeks, document, and provide at future appointments o Ensure daily salt intake < 2300 mg/day  Hyperlipidemia Lab Results  Component Value Date/Time   LDLCALC 65 10/18/2017 08:47 AM   LDLDIRECT 83.0 11/28/2018 08:55 AM    Pharmacist Clinical Goal(s): o Over the next 180 days, patient will work with PharmD and providers to achieve LDL goal < 70  Current regimen:  o Atorvastatin 10 mg once daily   Interventions: o Diet/exercise recommendations  Patient self care activities - Over the next 180 days, patient will: o Continue with diet/exercise recommendations  Diabetes Lab Results  Component Value Date/Time   HGBA1C 6.9 (A) 06/12/2019 09:57 AM   HGBA1C 6.2 11/28/2018 08:55 AM   HGBA1C 7.1 09/10/2018 12:00 AM   HGBA1C 5.8 (A) 09/25/2017 01:18 PM   HGBA1C 5.9 04/23/2014 09:20 AM    Pharmacist Clinical Goal(s): o Over the next 180 days, patient will work with PharmD and providers to  maintain A1c goal <7%  Current regimen:  o Metformin XR 500 mg every morning, 1000 mg in evening  Interventions: o Blood glucose monitoring  Patient self care activities - Over the next 180 days, patient will: o Check blood sugar as directed, document, and provide at future appointments o Contact provider with any episodes of hypoglycemia  Medication management  Pharmacist Clinical Goal(s): o Over the next 180 days, patient will work with PharmD and providers to maintain optimal medication adherence  [facility]  Interventions o Comprehensive medication review performed. o Continue current medication management strategy  Patient self care activities - Over the next 180 days, patient will: o Focus on medication adherence - Continue current management o Report any questions or concerns to PharmD and/or provider(s)  Initial goal documentation       The patient verbalized understanding of instructions provided today and agreed to receive a mailed copy of patient instruction and/or educational materials. Telephone follow up appointment with pharmacy team member scheduled for: See next appointment with "Care Management Staff" under "What's Next" below.   Dahlia Byes, Pharm.D., BCGP Clinical Pharmacist [facility] [phone]

## 2019-11-26 NOTE — Telephone Encounter (Signed)
.   LAST APPOINTMENT DATE: 11/26/2019   NEXT APPOINTMENT DATE:@11 /16/2021  MEDICATION:metoprolol tartrate (LOPRESSOR) 50 MG tablet  PHARMACY:[facility] [address] [place] - [address]  **Let patient know to contact pharmacy at the end of the day to make sure medication is ready. **  ** Please notify patient to allow 48-72 hours to process**  **Encourage patient to contact the pharmacy for refills or they can request refills through [facility]**  CLINICAL FILLS OUT ALL BELOW:   LAST REFILL:  QTY:  REFILL DATE:    OTHER COMMENTS:    Okay for refill?  Please advise

## 2019-11-26 NOTE — Progress Notes (Signed)
Chronic Care Management Pharmacy  Name: TRYSTON GILLIAM  MRN: [id number] DOB: 06-Jul-1945  Chief Complaint/ HPI  Paul Banks,  74 y.o. , male presents for their Follow-Up CCM visit with the clinical pharmacist via telephone due to COVID-19 Pandemic.  PCP : Vivi Barrack, MD  Chronic conditions include:  Encounter Diagnoses  Name Primary?  . Hypertension associated with diabetes ([place]) Yes  . Type 2 diabetes mellitus with stage 3 chronic kidney disease, without long-term current use of insulin, unspecified whether stage 3a or 3b CKD ([place])     Office Visits:  06/12/2019 (PCP): Restart amlodipine 5 mg daily.  Reported Home blood pressure in the 140s over 80s range.  No reported chest pain or shortness of breath.  Will continue losartan-HCTZ 100-25 once daily and Metroprolol tartrate 50 mg twice daily as well.  Patient Active Problem List   Diagnosis Date Noted  . Hypertension associated with diabetes ([place]) 11/08/2017  . Diabetes ([place]) 03/04/2015  . Hypercalcemia 10/10/2012  . Renal insufficiency 10/10/2012  . Gout 07/01/2009  . COUGH DUE TO ACE INHIBITORS 09/10/2008  . NASH (nonalcoholic steatohepatitis) 06/12/2008  . Dyslipidemia associated with type 2 diabetes mellitus ([place]) 05/02/2007  . Hypothyroidism 08/17/2006   No past surgical history on file. Social History   Socioeconomic History  . Marital status: Single    Spouse name: Not on file  . Number of children: Not on file  . Years of education: Not on file  . Highest education level: Not on file  Occupational History  . Occupation: Banker    Comment: for [employer]  . Smoking status: Never Smoker  . Smokeless tobacco: Never Used  Substance and Sexual Activity  . Alcohol use: Not on file  . Drug use: Not on file  . Sexual activity: Not on file  Other Topics Concern  . Not on file  Social History Narrative  . Not on file   Social Determinants of Health   Financial Resource Strain:   .  Difficulty of Paying Living Expenses: Not on file  Food Insecurity: No Food Insecurity  . Worried About Charity fundraiser in the Last Year: Never true  . Ran Out of Food in the Last Year: Never true  Transportation Needs: No Transportation Needs  . Lack of Transportation (Medical): No  . Lack of Transportation (Non-Medical): No  Physical Activity:   . Days of Exercise per Week: Not on file  . Minutes of Exercise per Session: Not on file  Stress:   . Feeling of Stress : Not on file  Social Connections:   . Frequency of Communication with Friends and Family: Not on file  . Frequency of Social Gatherings with Friends and Family: Not on file  . Attends Religious Services: Not on file  . Active Member of Clubs or Organizations: Not on file  . Attends Archivist Meetings: Not on file  . Marital Status: Not on file  SDOH (Social Determinants of Health) assessments performed: Yes. Family History  Problem Relation Age of Onset  . Cancer Mother        Breast Cancer  . Cancer Sister        Breast Cancer   Allergies  Allergen Reactions  . Penicillins     REACTION: Rash  . Pioglitazone     REACTION: edema  . Tetracycline     REACTION: Rash   Outpatient Encounter Medications as of 11/26/2019  Medication Sig  . ACCU-CHEK  AVIVA PLUS test strip USE TO CHECK BLOOD SUGAR 1 TIME PER DAY  . ACCU-CHEK SOFTCLIX LANCETS lancets USE TO CHECK BLOOD SUGAR 1 TIME PER DAY  . allopurinol (ZYLOPRIM) 100 MG tablet Take 0.5 tablets (50 mg total) by mouth daily.  Marland Kitchen amLODipine (NORVASC) 5 MG tablet Take 1 tablet (5 mg total) by mouth daily.  Marland Kitchen aspirin 81 MG tablet Take 81 mg by mouth daily.    Marland Kitchen atorvastatin (LIPITOR) 10 MG tablet TAKE ONE TABLET BY MOUTH DAILY  . Fish Oil-Cholecalciferol (FISH OIL + D3 PO) Take by mouth.  . levothyroxine (SYNTHROID) 137 MCG tablet TAKE ONE TABLET BY MOUTH DAILY BEFORE BREAKFAST  . losartan-hydrochlorothiazide (HYZAAR) 100-25 MG tablet Take 1 tablet by mouth  daily.  . metFORMIN (GLUCOPHAGE-XR) 500 MG 24 hr tablet Take 2 tablets (1,000 mg total) by mouth 2 (two) times daily.  . Multiple Vitamins-Minerals (MULTI COMPLETE PO) Take by mouth.  . potassium chloride (KLOR-CON) 8 MEQ tablet TAKE ONE TABLET BY MOUTH DAILY  . timolol (TIMOPTIC) 0.5 % ophthalmic solution   . [DISCONTINUED] metoprolol tartrate (LOPRESSOR) 50 MG tablet Take 1 tablet (50 mg total) by mouth 2 (two) times daily.   No facility-administered encounter medications on file as of 11/26/2019.   Patient Care Team    Relationship Specialty Notifications Start End  Vivi Barrack, MD PCP - General Family Medicine  11/08/17   Calvert Cantor, MD Consulting Physician Ophthalmology  10/01/14   Madelin Rear, [facility] Pharmacist Pharmacist  08/07/19    Comment: [phone]   Current Diagnosis/Assessment: Goals Addressed            This Visit's Progress   . PharmD Care Plan   On track    CARE PLAN ENTRY (see longitudinal plan of care for additional care plan information)  Current Barriers:  . Chronic Disease Management support, education, and care coordination needs related to Hypertension, Hyperlipidemia, and Diabetes.   Hypertension BP Readings from Last 3 Encounters:  08/26/19 138/87  06/12/19 (!) 180/110  11/28/18 (!) 164/108   . Pharmacist Clinical Goal(s): o Over the next 180 days, patient will work with PharmD and providers to maintain BP goal <140/90 . Current regimen:  . Losartan-hydrochlorothiazide 100-25 mg once daily . Metoprolol tartrate 50 mg twice daily . Amlodipine 5 mg once daily . Interventions: o Home BP monitoring o Diet recommendations . Patient self care activities - Over the next 180 days, patient will: o Check BP at least once every 1-2 weeks, document, and provide at future appointments o Ensure daily salt intake < 2300 mg/day  Hyperlipidemia Lab Results  Component Value Date/Time   LDLCALC 65 10/18/2017 08:47 AM   LDLDIRECT 83.0 11/28/2018 08:55  AM   . Pharmacist Clinical Goal(s): o Over the next 180 days, patient will work with PharmD and providers to achieve LDL goal < 70 . Current regimen:  o Atorvastatin 10 mg once daily  . Interventions: o Diet/exercise recommendations . Patient self care activities - Over the next 180 days, patient will: o Continue with diet/exercise recommendations  Diabetes Lab Results  Component Value Date/Time   HGBA1C 6.9 (A) 06/12/2019 09:57 AM   HGBA1C 6.2 11/28/2018 08:55 AM   HGBA1C 7.1 09/10/2018 12:00 AM   HGBA1C 5.8 (A) 09/25/2017 01:18 PM   HGBA1C 5.9 04/23/2014 09:20 AM   . Pharmacist Clinical Goal(s): o Over the next 180 days, patient will work with PharmD and providers to maintain A1c goal <7% . Current regimen:  o Metformin XR 500  mg every morning, 1000 mg in evening . Interventions: o Blood glucose monitoring . Patient self care activities - Over the next 180 days, patient will: o Check blood sugar as directed, document, and provide at future appointments o Contact provider with any episodes of hypoglycemia  Medication management . Pharmacist Clinical Goal(s): o Over the next 180 days, patient will work with PharmD and providers to maintain optimal medication adherence . [facility] . Interventions o Comprehensive medication review performed. o Continue current medication management strategy . Patient self care activities - Over the next 180 days, patient will: o Focus on medication adherence - Continue current management o Report any questions or concerns to PharmD and/or provider(s)  Initial goal documentation       Hypertension   BP goal <140/90  BP Readings from Last 3 Encounters:  08/26/19 138/87  06/12/19 (!) 180/110  11/28/18 (!) 164/108   Patient has failed these meds in the past: n/a Patient checks BP at home 1-2x per week. Patient home BP readings are ranging: 130-140s/80s  Continues to tolerate amlodipine w/out complaint. No  major changes to diet/exercise since 08/2019.  Continues with keto and paleo diets and walks and strength trains with home weights for exercise.  Not exercising as much as he should per pt - his goal weight is 185. At goal per JNC 8:  . Losartan-hydrochlorothiazide 100-25 mg once daily . Metoprolol tartrate 50 mg twice daily . Amlodipine 5 mg once daily  We discussed diet and exercise extensively - continue walking 3-5x/wk and strength training.   Plan  Continue current medications and control with diet and exercise.   Diabetes   A1c goal < 7%  Lab Results  Component Value Date/Time   HGBA1C 6.9 (A) 06/12/2019 09:57 AM   HGBA1C 6.2 11/28/2018 08:55 AM   HGBA1C 7.1 09/10/2018 12:00 AM   HGBA1C 5.8 (A) 09/25/2017 01:18 PM   HGBA1C 5.9 04/23/2014 09:20 AM   MICROALBUR 1.7 10/18/2017 08:47 AM   MICROALBUR 3.4 (H) 07/06/2016 11:39 AM   Denies any side effects from metformin. Checking BG: Weekly. Recent FBG readings 95, 99. Patient is currently at goal on the following medications:  Marland Kitchen Metformin XR 500 mg every morning and 1000 mg every evening  We discussed: diet and exercise extensively.  Plan  Consider high intensity statin atorvastatin 40 mg once daily.  Continue current medications and control with diet and exercise.  Hyperlipidemia   LDL goal < 70     Component Value Date/Time   CHOL 150 11/28/2018 0855   TRIG 203.0 (H) 11/28/2018 0855   TRIG 400 (HH) 01/16/2006 0805   HDL 39.30 11/28/2018 0855   LDLCALC 65 10/18/2017 0847   LDLDIRECT 83.0 11/28/2018 0855    Hepatic Function Latest Ref Rng & Units 11/28/2018 10/18/2017 07/06/2016  Total Protein 6.0 - 8.3 g/dL 7.8 7.7 7.7  Albumin 3.5 - 5.2 g/dL 4.6 4.5 4.6  AST 0 - 37 U/L _0 ALT 0 - 53 U/L _1 Alk Phosphatase 39 - 117 U/L 62 60 65  Total Bilirubin 0.2 - 1.2 mg/dL 0.9 1.1 0.8  Bilirubin, Direct 0.0 - 0.3 mg/dL - 0.2 0.1    The 10-year ASCVD risk score Mikey Bussing DC Jr., et al., 2013) is: 44%   Values  used to calculate the score:     Age: 28 years     Sex: Male     Is Non-Hispanic African American: No  Diabetic: Yes     Tobacco smoker: No     Systolic Blood Pressure: 161 mmHg     Is BP treated: Yes     HDL Cholesterol: 39.3 mg/dL     Total Cholesterol: 150 mg/dL   Diabetes, HTN, ASCVD 44%. BMI ~31, CKD. Patient is currently above goal on the following medications:  . Atorvastatin 10 mg once daily    We discussed:  diet and exercise extensively. BMI ~31. Current weight 208 lb.   Plan  Continue current medications and control with diet and exercise. Consider high intensity statin atorvastatin 40 mg once daily.   Medication Management Coordination   Receives prescription medications from:  [facility] [address] [place] [address] Phone: [phone] Fax: [phone]   Reports some frustration with [facility] and process to refill prescriptions. Offered upstream pharmacy services due to proactive refill review so pt would not find himself running out and he would not bear responsibility of managing refill requests. Pt interested but declines at this time.   Plan  Continue current medication management strategy. ___________________________  Future Appointments  Date Time Provider [place]  12/11/2019  8:20 AM Vivi Barrack, MD [facility]  06/29/2020  1:30 PM [facility] CCM PHARMACIST [facility]   Visit follow-up:  . CPAfollow-up: 6 month f/u DM call . RPH: 7 month f/u telephone visit, DM, HTN, Pharmacy Services  Madelin Rear, [place].D., BCGP Clinical Pharmacist [place] [phone]

## 2019-11-26 NOTE — Telephone Encounter (Signed)
Rx sent 

## 2019-12-04 ENCOUNTER — Telehealth: Payer: Self-pay

## 2019-12-04 ENCOUNTER — Encounter: Payer: Medicare HMO | Admitting: Family Medicine

## 2019-12-04 ENCOUNTER — Other Ambulatory Visit: Payer: Self-pay

## 2019-12-04 MED ORDER — POTASSIUM CHLORIDE ER 8 MEQ PO TBCR: 8.0000 meq | EXTENDED_RELEASE_TABLET | Freq: Every day | ORAL | 2 refills | Status: DC

## 2019-12-04 NOTE — Telephone Encounter (Signed)
MEDICATION: Potassium Chloride 8 MEQ  [facility] [address]  Comments:  Pt is asking for 90 DAY SUPPLY  **Let patient know to contact pharmacy at the end of the day to make sure medication is ready. **  ** Please notify patient to allow 48-72 hours to process**  **Encourage patient to contact the pharmacy for refills or they can request refills through [facility]**

## 2019-12-04 NOTE — Telephone Encounter (Signed)
90 day supply sent to pharmacy

## 2019-12-11 ENCOUNTER — Other Ambulatory Visit: Payer: Self-pay

## 2019-12-11 ENCOUNTER — Encounter: Payer: Self-pay | Admitting: Family Medicine

## 2019-12-11 ENCOUNTER — Ambulatory Visit (INDEPENDENT_AMBULATORY_CARE_PROVIDER_SITE_OTHER): Payer: Medicare HMO | Admitting: Family Medicine

## 2019-12-11 VITALS — BP 197/92 | HR 76 | Temp 98.0°F | Ht 69.0 in | Wt 214.8 lb

## 2019-12-11 DIAGNOSIS — Z6831 Body mass index (BMI) 31.0-31.9, adult: Secondary | ICD-10-CM

## 2019-12-11 DIAGNOSIS — E1159 Type 2 diabetes mellitus with other circulatory complications: Secondary | ICD-10-CM

## 2019-12-11 DIAGNOSIS — Z0001 Encounter for general adult medical examination with abnormal findings: Secondary | ICD-10-CM | POA: Diagnosis not present

## 2019-12-11 DIAGNOSIS — E1122 Type 2 diabetes mellitus with diabetic chronic kidney disease: Secondary | ICD-10-CM

## 2019-12-11 DIAGNOSIS — E039 Hypothyroidism, unspecified: Secondary | ICD-10-CM | POA: Diagnosis not present

## 2019-12-11 DIAGNOSIS — E1169 Type 2 diabetes mellitus with other specified complication: Secondary | ICD-10-CM | POA: Diagnosis not present

## 2019-12-11 DIAGNOSIS — E785 Hyperlipidemia, unspecified: Secondary | ICD-10-CM

## 2019-12-11 DIAGNOSIS — Z0000 Encounter for general adult medical examination without abnormal findings: Secondary | ICD-10-CM

## 2019-12-11 DIAGNOSIS — Z23 Encounter for immunization: Secondary | ICD-10-CM

## 2019-12-11 DIAGNOSIS — M1A9XX0 Chronic gout, unspecified, without tophus (tophi): Secondary | ICD-10-CM | POA: Diagnosis not present

## 2019-12-11 DIAGNOSIS — I152 Hypertension secondary to endocrine disorders: Secondary | ICD-10-CM

## 2019-12-11 DIAGNOSIS — E669 Obesity, unspecified: Secondary | ICD-10-CM

## 2019-12-11 DIAGNOSIS — N183 Chronic kidney disease, stage 3 unspecified: Secondary | ICD-10-CM

## 2019-12-11 LAB — POCT GLYCOSYLATED HEMOGLOBIN (HGB A1C): Hemoglobin A1C: 6.9 % — AB (ref 4.0–5.6)

## 2019-12-11 MED ORDER — LOSARTAN POTASSIUM-HCTZ 100-25 MG PO TABS
1.0000 | ORAL_TABLET | Freq: Every day | ORAL | 3 refills | Status: DC
Start: 2019-12-11 — End: 2019-12-12

## 2019-12-11 MED ORDER — LEVOTHYROXINE SODIUM 137 MCG PO TABS
ORAL_TABLET | ORAL | 3 refills | Status: DC
Start: 2019-12-11 — End: 2019-12-12

## 2019-12-11 MED ORDER — AMLODIPINE BESYLATE 10 MG PO TABS
10.0000 mg | ORAL_TABLET | Freq: Every day | ORAL | 3 refills | Status: DC
Start: 2019-12-11 — End: 2020-12-16

## 2019-12-11 MED ORDER — METFORMIN HCL ER 500 MG PO TB24
1000.0000 mg | ORAL_TABLET | Freq: Two times a day (BID) | ORAL | 3 refills | Status: DC
Start: 2019-12-11 — End: 2020-06-29

## 2019-12-11 NOTE — Assessment & Plan Note (Signed)
Check TSH.  Continue Synthroid 137 mcg daily. 

## 2019-12-11 NOTE — Progress Notes (Signed)
Chief Complaint:  Paul Banks is a 74 y.o. male who presents today for his annual comprehensive physical exam and subsequent medicare annual wellness visit.    Assessment/Plan:  Chronic Problems Addressed Today: Gout No recent flares.  Will check uric acid level today.  Continue allopurinol 50 mg daily.  Dyslipidemia associated with type 2 diabetes mellitus (HCC) Check lipids.  Continue Lipitor 10 mg daily.  Hypothyroidism Check TSH.  Continue Synthroid 137 mcg daily.  Diabetes (HCC) A1c stable at 6.9.  Continue Metformin 1000 mg twice daily.  Hypertension associated with diabetes ([facility]) Well above goal today.  Home readings typically in the 150s per patient.  Will increase amlodipine to 10 mg daily.  Continue losartan-HCTZ 100-25 once daily.  Also continue metoprolol tartrate 50 mg twice daily.  He will check with me in a couple weeks via MyChart.    Body mass index is 31.72 kg/m. / Obese  BMI Metric Follow Up - 12/11/19 0924      BMI Metric Follow Up-Please document annually   BMI Metric Follow Up Education provided            Preventative Healthcare: Flu vaccine given today.  Has received COVID vaccine.  Up-to-date on colon cancer screening.  Due for next colonoscopy in 2023.  No longer needs prostate cancer screening due to age.  Patient Counseling(The following topics were reviewed and/or handout was given):  -Nutrition: Stressed importance of moderation in sodium/caffeine intake, saturated fat and cholesterol, caloric balance, sufficient intake of fresh fruits, vegetables, and fiber.  -Stressed the importance of regular exercise.   -Substance Abuse: Discussed cessation/primary prevention of tobacco, alcohol, or other drug use; driving or other dangerous activities under the influence; availability of treatment for abuse.   -Injury prevention: Discussed safety belts, safety helmets, smoke detector, smoking near bedding or upholstery.   -Sexuality: Discussed  sexually transmitted diseases, partner selection, use of condoms, avoidance of unintended pregnancy and contraceptive alternatives.   -Dental health: Discussed importance of regular tooth brushing, flossing, and dental visits.  -Health maintenance and immunizations reviewed. Please refer to Health maintenance section.  During the course of the visit the patient was educated and counseled about appropriate screening and preventive services including:        Fall prevention   Nutrition Physical Activity Weight Management Cognition  Return to care in 1 year for next preventative visit.     Subjective:  HPI:  Health Risk Assessment: Patient considers his overall health to be good. He has no difficulty performing the following: . Preparing food and eating . Bathing  . Getting dressed . Using the toilet . Shopping . Managing Finances . Moving around from place to place  He has not had any falls within the past year.   Depression screen PHQ 2/9 12/11/2019  Decreased Interest 0  Down, Depressed, Hopeless 0  PHQ - 2 Score 0    Lifestyle Factors: Diet: None specific.  Exercise: Does a lot of walking.   Patient Care Team: Ardith Dark, MD as PCP - General (Family Medicine) Nelson Chimes, MD as Consulting Physician (Ophthalmology) Dahlia Byes, [facility] as Pharmacist (Pharmacist)   ROS: Per HPI, otherwise a complete review of systems was negative.   PMH:  The following were reviewed and entered/updated in epic: Past Medical History:  Diagnosis Date  . ANEMIA 07/01/2009  . COUGH DUE TO ACE INHIBITORS 09/10/2008  . DM 06/12/2008  . FATTY LIVER DISEASE 06/12/2008  . Gout, unspecified 07/01/2009  . HYPERCHOLESTEROLEMIA 05/02/2007  .  HYPERTENSION 08/17/2006  . HYPOTHYROIDISM 08/17/2006  . Mild renal insufficiency    w/u NEG  . NASH (nonalcoholic steatohepatitis)    Patient Active Problem List   Diagnosis Date Noted  . Hypertension associated with diabetes (HCC) 11/08/2017  .  Diabetes ([facility]) 03/04/2015  . Hypercalcemia 10/10/2012  . Renal insufficiency 10/10/2012  . Gout 07/01/2009  . COUGH DUE TO ACE INHIBITORS 09/10/2008  . NASH (nonalcoholic steatohepatitis) 06/12/2008  . Dyslipidemia associated with type 2 diabetes mellitus (HCC) 05/02/2007  . Hypothyroidism 08/17/2006   No past surgical history on file.  Family History  Problem Relation Age of Onset  . Cancer Mother        Breast Cancer  . Cancer Sister        Breast Cancer    Medications- reviewed and updated Current Outpatient Medications  Medication Sig Dispense Refill  . ACCU-CHEK AVIVA PLUS test strip USE TO CHECK BLOOD SUGAR 1 TIME PER DAY 100 each 1  . ACCU-CHEK SOFTCLIX LANCETS lancets USE TO CHECK BLOOD SUGAR 1 TIME PER DAY 100 each 2  . allopurinol (ZYLOPRIM) 100 MG tablet Take 0.5 tablets (50 mg total) by mouth daily. 40 tablet 2  . amLODipine (NORVASC) 10 MG tablet Take 1 tablet (10 mg total) by mouth daily. 90 tablet 3  . aspirin 81 MG tablet Take 81 mg by mouth daily.      Marland Kitchen atorvastatin (LIPITOR) 10 MG tablet TAKE ONE TABLET BY MOUTH DAILY 90 tablet 1  . Fish Oil-Cholecalciferol (FISH OIL + D3 PO) Take by mouth.    . levothyroxine (SYNTHROID) 137 MCG tablet TAKE ONE TABLET BY MOUTH DAILY BEFORE BREAKFAST 90 tablet 3  . losartan-hydrochlorothiazide (HYZAAR) 100-25 MG tablet Take 1 tablet by mouth daily. 90 tablet 3  . metFORMIN (GLUCOPHAGE-XR) 500 MG 24 hr tablet Take 2 tablets (1,000 mg total) by mouth 2 (two) times daily. 360 tablet 3  . metoprolol tartrate (LOPRESSOR) 50 MG tablet Take 1 tablet (50 mg total) by mouth 2 (two) times daily. 180 tablet 3  . Multiple Vitamins-Minerals (MULTI COMPLETE PO) Take by mouth.    . potassium chloride (KLOR-CON) 8 MEQ tablet Take 1 tablet (8 mEq total) by mouth daily. 90 tablet 2  . timolol (TIMOPTIC) 0.5 % ophthalmic solution      No current facility-administered medications for this visit.    Allergies-reviewed and updated Allergies   Allergen Reactions  . Penicillins     REACTION: Rash  . Pioglitazone     REACTION: edema  . Tetracycline     REACTION: Rash    Social History   Socioeconomic History  . Marital status: Single    Spouse name: Not on file  . Number of children: Not on file  . Years of education: Not on file  . Highest education level: Not on file  Occupational History  . Occupation: Production manager    Comment: for [employer]  . Smoking status: Never Smoker  . Smokeless tobacco: Never Used  Substance and Sexual Activity  . Alcohol use: Not on file  . Drug use: Not on file  . Sexual activity: Not on file  Other Topics Concern  . Not on file  Social History Narrative  . Not on file   Social Determinants of Health   Financial Resource Strain:   . Difficulty of Paying Living Expenses: Not on file  Food Insecurity: No Food Insecurity  . Worried About Programme researcher, broadcasting/film/video in the Last Year: Never true  .  Ran Out of Food in the Last Year: Never true  Transportation Needs: No Transportation Needs  . Lack of Transportation (Medical): No  . Lack of Transportation (Non-Medical): No  Physical Activity:   . Days of Exercise per Week: Not on file  . Minutes of Exercise per Session: Not on file  Stress:   . Feeling of Stress : Not on file  Social Connections:   . Frequency of Communication with Friends and Family: Not on file  . Frequency of Social Gatherings with Friends and Family: Not on file  . Attends Religious Services: Not on file  . Active Member of Clubs or Organizations: Not on file  . Attends Banker Meetings: Not on file  . Marital Status: Not on file        Objective:  Physical Exam: BP (!) 197/92   Pulse 76   Temp 98 F (36.7 C) (Temporal)   Ht 5\' 9"  (1.753 m)   Wt 214 lb 12.8 oz (97.4 kg)   SpO2 99%   BMI 31.72 kg/m   Body mass index is 31.72 kg/m. Wt Readings from Last 3 Encounters:  12/11/19 214 lb 12.8 oz (97.4 kg)  06/12/19 215 lb 3.2 oz  (97.6 kg)  11/28/18 210 lb 3.2 oz (95.3 kg)   Gen: NAD, resting comfortably HEENT: TMs normal bilaterally. OP clear. No thyromegaly noted.  CV: RRR with no murmurs appreciated Pulm: NWOB, CTAB with no crackles, wheezes, or rhonchi GI: Normal bowel sounds present. Soft, Nontender, Nondistended. MSK: no edema, cyanosis, or clubbing noted Skin: warm, dry Neuro: CN2-12 grossly intact. Strength 5/5 in upper and lower extremities. Reflexes symmetric and intact bilaterally. Normal minicog with 3/3 delayed word recall.  Psych: Normal affect and thought content     Eugene Isadore M. 11/30/18, MD 12/11/2019 9:27 AM

## 2019-12-11 NOTE — Assessment & Plan Note (Signed)
A1c stable at 6.9.  Continue Metformin 1000 mg twice daily.

## 2019-12-11 NOTE — Assessment & Plan Note (Signed)
Well above goal today.  Home readings typically in the 150s per patient.  Will increase amlodipine to 10 mg daily.  Continue losartan-HCTZ 100-25 once daily.  Also continue metoprolol tartrate 50 mg twice daily.  He will check with me in a couple weeks via MyChart.

## 2019-12-11 NOTE — Assessment & Plan Note (Signed)
Check lipids.  Continue Lipitor 10 mg daily. 

## 2019-12-11 NOTE — Assessment & Plan Note (Signed)
No recent flares.  Will check uric acid level today.  Continue allopurinol 50 mg daily.

## 2019-12-11 NOTE — Patient Instructions (Signed)
It was very nice to see you today!  We will check blood work today.  Please increase your dose of amlodipine to 10 mg daily.  Keep an eye on your blood pressure and let me know how it is looking over the next few weeks.  I will see back in 6 months to recheck your blood sugar.  Please come back to see me sooner if needed.  Take care, Dr Jimmey Ralph  Please try these tips to maintain a healthy lifestyle:   Eat at least 3 REAL meals and 1-2 snacks per day.  Aim for no more than 5 hours between eating.  If you eat breakfast, please do so within one hour of getting up.    Each meal should contain half fruits/vegetables, one quarter protein, and one quarter carbs (no bigger than a computer mouse)   Cut down on sweet beverages. This includes juice, soda, and sweet tea.     Drink at least 1 glass of water with each meal and aim for at least 8 glasses per day   Exercise at least 150 minutes every week.    Preventive Care 22 Years and Older, Male Preventive care refers to lifestyle choices and visits with your health care provider that can promote health and wellness. This includes:  A yearly physical exam. This is also called an annual well check.  Regular dental and eye exams.  Immunizations.  Screening for certain conditions.  Healthy lifestyle choices, such as diet and exercise. What can I expect for my preventive care visit? Physical exam Your health care provider will check:  Height and weight. These may be used to calculate body mass index (BMI), which is a measurement that tells if you are at a healthy weight.  Heart rate and blood pressure.  Your skin for abnormal spots. Counseling Your health care provider may ask you questions about:  Alcohol, tobacco, and drug use.  Emotional well-being.  Home and relationship well-being.  Sexual activity.  Eating habits.  History of falls.  Memory and ability to understand (cognition).  Work and work  Astronomer. What immunizations do I need?  Influenza (flu) vaccine  This is recommended every year. Tetanus, diphtheria, and pertussis (Tdap) vaccine  You may need a Td booster every 10 years. Varicella (chickenpox) vaccine  You may need this vaccine if you have not already been vaccinated. Zoster (shingles) vaccine  You may need this after age 22. Pneumococcal conjugate (PCV13) vaccine  One dose is recommended after age 64. Pneumococcal polysaccharide (PPSV23) vaccine  One dose is recommended after age 3. Measles, mumps, and rubella (MMR) vaccine  You may need at least one dose of MMR if you were born in 1957 or later. You may also need a second dose. Meningococcal conjugate (MenACWY) vaccine  You may need this if you have certain conditions. Hepatitis A vaccine  You may need this if you have certain conditions or if you travel or work in places where you may be exposed to hepatitis A. Hepatitis B vaccine  You may need this if you have certain conditions or if you travel or work in places where you may be exposed to hepatitis B. Haemophilus influenzae type b (Hib) vaccine  You may need this if you have certain conditions. You may receive vaccines as individual doses or as more than one vaccine together in one shot (combination vaccines). Talk with your health care provider about the risks and benefits of combination vaccines. What tests do I need? Blood tests  Lipid and cholesterol levels. These may be checked every 5 years, or more frequently depending on your overall health.  Hepatitis C test.  Hepatitis B test. Screening  Lung cancer screening. You may have this screening every year starting at age 94 if you have a 30-pack-year history of smoking and currently smoke or have quit within the past 15 years.  Colorectal cancer screening. All adults should have this screening starting at age 68 and continuing until age 6. Your health care provider may recommend  screening at age 6 if you are at increased risk. You will have tests every 1-10 years, depending on your results and the type of screening test.  Prostate cancer screening. Recommendations will vary depending on your family history and other risks.  Diabetes screening. This is done by checking your blood sugar (glucose) after you have not eaten for a while (fasting). You may have this done every 1-3 years.  Abdominal aortic aneurysm (AAA) screening. You may need this if you are a current or former smoker.  Sexually transmitted disease (STD) testing. Follow these instructions at home: Eating and drinking  Eat a diet that includes fresh fruits and vegetables, whole grains, lean protein, and low-fat dairy products. Limit your intake of foods with high amounts of sugar, saturated fats, and salt.  Take vitamin and mineral supplements as recommended by your health care provider.  Do not drink alcohol if your health care provider tells you not to drink.  If you drink alcohol: ? Limit how much you have to 0-2 drinks a day. ? Be aware of how much alcohol is in your drink. In the [place] one drink equals one 12 oz bottle of beer (355 mL), one 5 oz glass of wine (148 mL), or one 1 oz glass of hard liquor (44 mL). Lifestyle  Take daily care of your teeth and gums.  Stay active. Exercise for at least 30 minutes on 5 or more days each week.  Do not use any products that contain nicotine or tobacco, such as cigarettes, e-cigarettes, and chewing tobacco. If you need help quitting, ask your health care provider.  If you are sexually active, practice safe sex. Use a condom or other form of protection to prevent STIs (sexually transmitted infections).  Talk with your health care provider about taking a low-dose aspirin or statin. What's next?  Visit your health care provider once a year for a well check visit.  Ask your health care provider how often you should have your eyes and teeth  checked.  Stay up to date on all vaccines. This information is not intended to replace advice given to you by your health care provider. Make sure you discuss any questions you have with your health care provider. Document Revised: 12/21/2017 Document Reviewed: 12/21/2017 Elsevier Patient Education  2020 [employer].

## 2019-12-12 ENCOUNTER — Other Ambulatory Visit: Payer: Self-pay | Admitting: Family Medicine

## 2019-12-12 LAB — COMPREHENSIVE METABOLIC PANEL
AG Ratio: 1.2 (calc) (ref 1.0–2.5)
ALT: 20 U/L (ref 9–46)
AST: 18 U/L (ref 10–35)
Albumin: 4.7 g/dL (ref 3.6–5.1)
Alkaline phosphatase (APISO): 67 U/L (ref 35–144)
BUN: 20 mg/dL (ref 7–25)
CO2: 30 mmol/L (ref 20–32)
Calcium: 10.6 mg/dL — ABNORMAL HIGH (ref 8.6–10.3)
Chloride: 98 mmol/L (ref 98–110)
Creat: 1.16 mg/dL (ref 0.70–1.18)
Globulin: 3.8 g/dL (calc) — ABNORMAL HIGH (ref 1.9–3.7)
Glucose, Bld: 177 mg/dL — ABNORMAL HIGH (ref 65–99)
Potassium: 4.3 mmol/L (ref 3.5–5.3)
Sodium: 137 mmol/L (ref 135–146)
Total Bilirubin: 0.6 mg/dL (ref 0.2–1.2)
Total Protein: 8.5 g/dL — ABNORMAL HIGH (ref 6.1–8.1)

## 2019-12-12 LAB — URIC ACID: Uric Acid, Serum: 4.3 mg/dL (ref 4.0–8.0)

## 2019-12-12 LAB — CBC
HCT: 46.9 % (ref 38.5–50.0)
Hemoglobin: 15.8 g/dL (ref 13.2–17.1)
MCH: 29.3 pg (ref 27.0–33.0)
MCHC: 33.7 g/dL (ref 32.0–36.0)
MCV: 87 fL (ref 80.0–100.0)
MPV: 11 fL (ref 7.5–12.5)
Platelets: 308 10*3/uL (ref 140–400)
RBC: 5.39 10*6/uL (ref 4.20–5.80)
RDW: 14.2 % (ref 11.0–15.0)
WBC: 9.8 10*3/uL (ref 3.8–10.8)

## 2019-12-12 LAB — LIPID PANEL
Cholesterol: 162 mg/dL (ref <200)
HDL: 44 mg/dL (ref >=40)
LDL Cholesterol (Calc): 81 mg/dL (calc)
Non-HDL Cholesterol (Calc): 118 mg/dL (calc) (ref <130)
Total CHOL/HDL Ratio: 3.7 (calc) (ref <5.0)
Triglycerides: 279 mg/dL — ABNORMAL HIGH (ref <150)

## 2019-12-12 LAB — TSH: TSH: 2.29 mIU/L (ref 0.40–4.50)

## 2019-12-12 NOTE — Progress Notes (Signed)
Please inform patient of the following:  Labs are all STABLE. Would like for him to keep working on diet and exercise. We should have him come back in 6 months to recheck A1c.  Katina Degree. Jimmey Ralph, MD 12/12/2019 1:58 PM

## 2019-12-15 ENCOUNTER — Other Ambulatory Visit: Payer: Self-pay | Admitting: Family Medicine

## 2019-12-15 ENCOUNTER — Other Ambulatory Visit: Payer: Self-pay | Admitting: Physician Assistant

## 2020-01-08 ENCOUNTER — Encounter: Payer: Self-pay | Admitting: Family Medicine

## 2020-01-08 ENCOUNTER — Telehealth (INDEPENDENT_AMBULATORY_CARE_PROVIDER_SITE_OTHER): Payer: Medicare HMO | Admitting: Family Medicine

## 2020-01-08 VITALS — Ht 69.0 in | Wt 210.0 lb

## 2020-01-08 DIAGNOSIS — U071 COVID-19: Secondary | ICD-10-CM

## 2020-01-08 DIAGNOSIS — R059 Cough, unspecified: Secondary | ICD-10-CM | POA: Diagnosis not present

## 2020-01-08 MED ORDER — AZELASTINE HCL 0.1 % NA SOLN: 2.0000 | Freq: Two times a day (BID) | NASAL | 12 refills | Status: DC

## 2020-01-08 MED ORDER — BENZONATATE 200 MG PO CAPS: 200.0000 mg | ORAL_CAPSULE | Freq: Two times a day (BID) | ORAL | 0 refills | Status: DC | PRN

## 2020-01-08 NOTE — Progress Notes (Signed)
   Paul Banks is a 74 y.o. male who presents today for a telephone visit.  Assessment/Plan:  New/Acute Problems: Cough / covid No red flags.  Thankfully symptoms seem to be improving.  Symptoms are overall mild.  He is out of window for antibody infusion.  Will continue symptomatic treatment.  Start Astelin and Tessalon.  He can continue over-the-counter meds as well.  Discussed isolation protocol.  Discussed reasons return to care.  Follow-up as needed.    Subjective:  HPI:  Symptoms started about 10 days ago. Took a home covid test which was positive. Has been taking vitamins and alka seltzer. No fevers. Less taste. Decreased appetite. No shortness of breath or chest pain. Symptoms are improving.        Objective/Observations   NAD.  Telephone Visit   I connected with Paul Banks on 01/08/20 at 11:00 AM EST via telephone and verified that I am speaking with the correct person using two identifiers. I discussed the limitations of evaluation and management by telemedicine and the availability of in person appointments. The patient expressed understanding and agreed to proceed.   Patient location: Home Provider location: [address] [facility] [employer] Persons participating in the virtual visit: Myself and patient  A total of 11 minutes were spent on medical discussion.      Katina Degree. Jimmey Ralph, MD 01/08/2020 11:26 AM

## 2020-02-06 ENCOUNTER — Telehealth: Payer: Self-pay

## 2020-02-06 NOTE — Chronic Care Management (AMB) (Signed)
Chronic Care Management Pharmacy Assistant   Name: Paul Banks  MRN: [id number] DOB: 1945/11/18  Reason for Encounter: Disease State/ Hypertension and Diabetes Adherence Call  PCP : Ardith Dark, MD  Allergies:   Allergies  Allergen Reactions  . Penicillins     REACTION: Rash  . Pioglitazone     REACTION: edema  . Tetracycline     REACTION: Rash    Medications: Outpatient Encounter Medications as of 02/06/2020  Medication Sig  . ACCU-CHEK AVIVA PLUS test strip USE TO CHECK BLOOD SUGAR 1 TIME PER DAY  . ACCU-CHEK SOFTCLIX LANCETS lancets USE TO CHECK BLOOD SUGAR 1 TIME PER DAY  . allopurinol (ZYLOPRIM) 100 MG tablet TAKE 1/2 TABLET BY MOUTH DAILY  . amLODipine (NORVASC) 10 MG tablet Take 1 tablet (10 mg total) by mouth daily.  Marland Kitchen aspirin 81 MG tablet Take 81 mg by mouth daily.  Marland Kitchen atorvastatin (LIPITOR) 10 MG tablet TAKE ONE TABLET BY MOUTH DAILY  . azelastine (ASTELIN) 0.1 % nasal spray Place 2 sprays into both nostrils 2 (two) times daily.  . benzonatate (TESSALON) 200 MG capsule Take 1 capsule (200 mg total) by mouth 2 (two) times daily as needed for cough.  . Fish Oil-Cholecalciferol (FISH OIL + D3 PO) Take by mouth.  . levothyroxine (SYNTHROID) 137 MCG tablet TAKE ONE TABLET BY MOUTH DAILY BEFORE BREAKFAST  . losartan-hydrochlorothiazide (HYZAAR) 100-25 MG tablet TAKE ONE TABLET BY MOUTH DAILY  . metFORMIN (GLUCOPHAGE-XR) 500 MG 24 hr tablet Take 2 tablets (1,000 mg total) by mouth 2 (two) times daily.  . metoprolol tartrate (LOPRESSOR) 50 MG tablet Take 1 tablet (50 mg total) by mouth 2 (two) times daily.  . Multiple Vitamins-Minerals (MULTI COMPLETE PO) Take by mouth.  . potassium chloride (KLOR-CON) 8 MEQ tablet Take 1 tablet (8 mEq total) by mouth daily.  . timolol (TIMOPTIC) 0.5 % ophthalmic solution    No facility-administered encounter medications on file as of 02/06/2020.    Current Diagnosis: Patient Active Problem List   Diagnosis Date Noted  .  Hypertension associated with diabetes (HCC) 11/08/2017  . Diabetes (HCC) 03/04/2015  . Hypercalcemia 10/10/2012  . Renal insufficiency 10/10/2012  . Gout 07/01/2009  . COUGH DUE TO ACE INHIBITORS 09/10/2008  . NASH (nonalcoholic steatohepatitis) 06/12/2008  . Dyslipidemia associated with type 2 diabetes mellitus (HCC) 05/02/2007  . Hypothyroidism 08/17/2006   Reviewed chart prior to disease state call. Spoke with patient regarding BP  Recent Office Vitals: BP Readings from Last 3 Encounters:  12/11/19 (!) 197/92  08/26/19 138/87  06/12/19 (!) 180/110   Pulse Readings from Last 3 Encounters:  12/11/19 76  06/12/19 72  11/28/18 72    Wt Readings from Last 3 Encounters:  01/08/20 210 lb (95.3 kg)  12/11/19 214 lb 12.8 oz (97.4 kg)  06/12/19 215 lb 3.2 oz (97.6 kg)     Kidney Function Lab Results  Component Value Date/Time   CREATININE 1.16 12/11/2019 09:33 AM   CREATININE 1.17 11/28/2018 08:55 AM   CREATININE 1.21 10/18/2017 08:47 AM   GFR 61.37 11/28/2018 08:55 AM   GFRNONAA 53.25 06/29/2009 08:48 AM   GFRAA 62 04/27/2007 07:47 AM    BMP Latest Ref Rng & Units 12/11/2019 11/28/2018 10/18/2017  Glucose 65 - 99 mg/dL 283(M) 629(U) -  BUN 7 - 25 mg/dL 20 19 -  Creatinine 7.65 - 1.18 mg/dL 4.65 0.35 -  BUN/Creat Ratio 6 - 22 (calc) NOT APPLICABLE - -  Sodium 135 - 146  mmol/L 137 138 -  Potassium 3.5 - 5.3 mmol/L 4.3 4.2 -  Chloride 98 - 110 mmol/L 98 96 -  CO2 20 - 32 mmol/L 30 30 -  Calcium 8.6 - 10.3 mg/dL 10.6(H) 10.0 9.8    . Current antihypertensive regimen:  o Amlodipine 10 mg daily o Losartan-HCTZ 100-25 mg daily o Metoprolol 50 mg twice daily  . How often are you checking your Blood Pressure? 1-2x per week   . Current home BP readings: 139/81  . What recent interventions/DTPs have been made by any provider to improve Blood Pressure and Glycemic Control since last CPP Visit: Increased Amlodipine 5 mg daily to 10 mg daily on 12/11/2019  . Any recent  hospitalizations or ED visits since last visit with CPP? No , patient has not had any recent hospitalizations or ED visits since his last visit with Dahlia Byes, Pharm.D., BCGP.  Marland Kitchen What diet changes have been made to improve Blood Pressure and Glycemic Control?  o Patient states he is still maintaining his diet trying to avoid salt and sweets.  . What exercise is being done to improve your Blood Pressure and Glycemic Control?  o Patient states he is still walking and strength training. Patient states he doesn't strength train as much as he walks.  Adherence Review: Is the patient currently on ACE/ARB medication? Yes Does the patient have >5 day gap between last estimated fill dates? No  Recent Relevant Labs: Lab Results  Component Value Date/Time   HGBA1C 6.9 (A) 12/11/2019 08:36 AM   HGBA1C 6.9 (A) 06/12/2019 09:57 AM   HGBA1C 6.2 11/28/2018 08:55 AM   HGBA1C 7.1 09/10/2018 12:00 AM   HGBA1C 5.9 04/23/2014 09:20 AM   MICROALBUR 1.7 10/18/2017 08:47 AM   MICROALBUR 3.4 (H) 07/06/2016 11:39 AM    Kidney Function Lab Results  Component Value Date/Time   CREATININE 1.16 12/11/2019 09:33 AM   CREATININE 1.17 11/28/2018 08:55 AM   CREATININE 1.21 10/18/2017 08:47 AM   GFR 61.37 11/28/2018 08:55 AM   GFRNONAA 53.25 06/29/2009 08:48 AM   GFRAA 62 04/27/2007 07:47 AM    . Current antihyperglycemic regimen:  o Metformin 500 mg two tablets twice daily  . Patient denies hypoglycemic symptoms, including Pale, Sweaty, Shaky, Hungry, Nervous/irritable and Vision changes   . Patient denies hyperglycemic symptoms, including blurry vision, excessive thirst, fatigue, polyuria and weakness   . How often are you checking your blood sugar? Patient states he checks his blood sugars once a day in the evening.  . What are your blood sugars ranging?  o Fasting: n/a o Before meals: n/a o After meals: 101 o Bedtime: n/a  . During the week, how often does your blood glucose drop below 70? Never    . Are you checking your feet daily/regularly?   Patient states he does check his feet regularly and states they are doing good.  Adherence Review: Is the patient currently on a STATIN medication? Yes Is the patient currently on ACE/ARB medication? Yes Does the patient have >5 day gap between last estimated fill dates? No  - Patient states he had [employer] COVID vaccine 03/2019. He states he tested positive for COVID on 01/06/2020, with mild symptoms. Patient would like to know when he should receive a booster vaccine?  - Patient states when his medication Amlodipine was increased from 5 mg to 10 mg he doubled up on his current dosage as 5 mg in the morning and 5 mg in the evenings. Patient  states he has since picked up a new Rx for the 10 mg tablets. Patient states he has been splitting these tablets in half with a "pill splitter", taking 1/2 tablet in the morning and 1/2 tablet in the evening. Patient would like to know if it's okay for him to be taking his medication this way?  April D Calhoun, [facility] Clinical Pharmacist Assistant [phone]   Follow-Up:  Pharmacist Review

## 2020-03-10 ENCOUNTER — Other Ambulatory Visit: Payer: Self-pay | Admitting: Family Medicine

## 2020-03-25 DIAGNOSIS — H401131 Primary open-angle glaucoma, bilateral, mild stage: Secondary | ICD-10-CM | POA: Diagnosis not present

## 2020-03-25 DIAGNOSIS — H04123 Dry eye syndrome of bilateral lacrimal glands: Secondary | ICD-10-CM | POA: Diagnosis not present

## 2020-03-25 DIAGNOSIS — E113293 Type 2 diabetes mellitus with mild nonproliferative diabetic retinopathy without macular edema, bilateral: Secondary | ICD-10-CM | POA: Diagnosis not present

## 2020-03-25 DIAGNOSIS — H25813 Combined forms of age-related cataract, bilateral: Secondary | ICD-10-CM | POA: Diagnosis not present

## 2020-03-25 LAB — HM DIABETES EYE EXAM

## 2020-05-08 ENCOUNTER — Telehealth: Payer: Self-pay

## 2020-05-08 NOTE — Chronic Care Management (AMB) (Signed)
Chronic Care Management Pharmacy Assistant   Name: Paul Banks  MRN: [id number] DOB: 17-Jun-1945  Reason for Encounter: Hypertension and Diabetes Mellitus State Call   Recent office visits:  No visits noted  Recent consult visits:  No visits noted  Hospital visits:  None in previous 6 months  Medications: Outpatient Encounter Medications as of 05/08/2020  Medication Sig  . ACCU-CHEK AVIVA PLUS test strip USE TO CHECK BLOOD SUGAR 1 TIME PER DAY  . ACCU-CHEK SOFTCLIX LANCETS lancets USE TO CHECK BLOOD SUGAR 1 TIME PER DAY  . allopurinol (ZYLOPRIM) 100 MG tablet TAKE 1/2 TABLET BY MOUTH DAILY  . amLODipine (NORVASC) 10 MG tablet Take 1 tablet (10 mg total) by mouth daily.  Marland Kitchen aspirin 81 MG tablet Take 81 mg by mouth daily.  Marland Kitchen atorvastatin (LIPITOR) 10 MG tablet TAKE ONE TABLET BY MOUTH DAILY  . azelastine (ASTELIN) 0.1 % nasal spray Place 2 sprays into both nostrils 2 (two) times daily.  . benzonatate (TESSALON) 200 MG capsule Take 1 capsule (200 mg total) by mouth 2 (two) times daily as needed for cough.  . Fish Oil-Cholecalciferol (FISH OIL + D3 PO) Take by mouth.  . levothyroxine (SYNTHROID) 137 MCG tablet TAKE ONE TABLET BY MOUTH DAILY BEFORE BREAKFAST  . losartan-hydrochlorothiazide (HYZAAR) 100-25 MG tablet TAKE ONE TABLET BY MOUTH DAILY  . metFORMIN (GLUCOPHAGE-XR) 500 MG 24 hr tablet Take 2 tablets (1,000 mg total) by mouth 2 (two) times daily.  . metoprolol tartrate (LOPRESSOR) 50 MG tablet Take 1 tablet (50 mg total) by mouth 2 (two) times daily.  . Multiple Vitamins-Minerals (MULTI COMPLETE PO) Take by mouth.  . potassium chloride (KLOR-CON) 8 MEQ tablet Take 1 tablet (8 mEq total) by mouth daily.  . timolol (TIMOPTIC) 0.5 % ophthalmic solution    No facility-administered encounter medications on file as of 05/08/2020.   Reviewed chart prior to disease state call. Spoke with patient regarding BP  Recent Office Vitals: BP Readings from Last 3 Encounters:  12/11/19  (!) 197/92  08/26/19 138/87  06/12/19 (!) 180/110   Pulse Readings from Last 3 Encounters:  12/11/19 76  06/12/19 72  11/28/18 72    Wt Readings from Last 3 Encounters:  01/08/20 210 lb (95.3 kg)  12/11/19 214 lb 12.8 oz (97.4 kg)  06/12/19 215 lb 3.2 oz (97.6 kg)     Kidney Function Lab Results  Component Value Date/Time   CREATININE 1.16 12/11/2019 09:33 AM   CREATININE 1.17 11/28/2018 08:55 AM   CREATININE 1.21 10/18/2017 08:47 AM   GFR 61.37 11/28/2018 08:55 AM   GFRNONAA 53.25 06/29/2009 08:48 AM   GFRAA 62 04/27/2007 07:47 AM    BMP Latest Ref Rng & Units 12/11/2019 11/28/2018 10/18/2017  Glucose 65 - 99 mg/dL 440(N) 027(O) -  BUN 7 - 25 mg/dL 20 19 -  Creatinine 5.36 - 1.18 mg/dL 6.44 0.34 -  BUN/Creat Ratio 6 - 22 (calc) NOT APPLICABLE - -  Sodium 135 - 146 mmol/L 137 138 -  Potassium 3.5 - 5.3 mmol/L 4.3 4.2 -  Chloride 98 - 110 mmol/L 98 96 -  CO2 20 - 32 mmol/L 30 30 -  Calcium 8.6 - 10.3 mg/dL 10.6(H) 10.0 9.8    Current antihypertensive regimen: Amlodipine 10 mg daily- Patient takes 1/2 pill in the morning and 1/2 every evening Losartan-HCTZ 100-25 mg daily Metoprolol 50 mg twice daily  How often are you checking your Blood Pressure? Patient stated he checks his BP at least 3  times per week  Current home BP readings: 132/80,135/82,125/78  What recent interventions/DTPs have been made by any provider to improve Blood Pressure control since last CPP Visit:  No recent interventions  Any recent hospitalizations or ED visits since last visit with CPP? No  What diet changes have been made to improve Blood Pressure Control?  Patient stated he eats a well balanced and nutritional diet. A typical day includes an egg mcmuffin, yogurt, coffee, protein shake, and  fish   What exercise is being done to improve your Blood Pressure Control?  Paul Banks stated he walks and does weights at least 2-3x per week. He stated he stays pretty religious with his exercise. He  has a weight loss goal of 20-30 lbs and is working to reach his goal.    Current antihyperglycemic regimen:  Metformin 500 mg two tablets twice daily Patient reported taking differently: 1 tab every morning 2 tabs every evening due to having diarrhea when he would [place] as prescribed  What recent interventions/DTPs have been made to improve glycemic control:  No recent interventions  Patient denies hypoglycemic symptoms Patient denies hyperglycemic symptoms  How often are you checking your blood sugar?  Patient stated he checks his BS weekly   What are your blood sugars ranging?  o Fasting: 90-101 o Before meals: n/a o After meals: n/a o Bedtime: n/a  During the week, how often does your blood glucose drop below 70? Never  Are you checking your feet daily/regularly?  Patient remains in good health and had no concerns for his feet.   Adherence Review: Is the patient currently on a STATIN medication? Yes Is the patient currently on ACE/ARB medication? Yes  Does the patient have >5 day gap between last estimated fill dates? Yes Metformin 500 mg- 90 DS last filled 12/11/19  Amlodipine 10 mg- 90 DS last filled 04/10/20- Fill hx reported by pt  Metoprolol 50 mg - 90 DS last filled 03/23/20  Star Rating Drugs: Losartan-HCTZ 100-25 mg- 90 DS last filled 03/10/20 Atorvastatin 10 mg- 90 DS last filled 03/10/20  Paul Banks CPA, CMA

## 2020-06-08 ENCOUNTER — Ambulatory Visit: Payer: Medicare HMO | Admitting: Family Medicine

## 2020-06-15 ENCOUNTER — Ambulatory Visit: Payer: Medicare HMO | Admitting: Family Medicine

## 2020-06-24 ENCOUNTER — Ambulatory Visit (INDEPENDENT_AMBULATORY_CARE_PROVIDER_SITE_OTHER): Payer: Medicare HMO | Admitting: Family Medicine

## 2020-06-24 ENCOUNTER — Other Ambulatory Visit: Payer: Self-pay

## 2020-06-24 VITALS — BP 190/83 | HR 85 | Temp 98.3°F | Ht 69.0 in | Wt 217.2 lb

## 2020-06-24 DIAGNOSIS — I152 Hypertension secondary to endocrine disorders: Secondary | ICD-10-CM

## 2020-06-24 DIAGNOSIS — E1159 Type 2 diabetes mellitus with other circulatory complications: Secondary | ICD-10-CM | POA: Diagnosis not present

## 2020-06-24 DIAGNOSIS — N183 Chronic kidney disease, stage 3 unspecified: Secondary | ICD-10-CM

## 2020-06-24 DIAGNOSIS — E1122 Type 2 diabetes mellitus with diabetic chronic kidney disease: Secondary | ICD-10-CM | POA: Diagnosis not present

## 2020-06-24 LAB — POCT GLYCOSYLATED HEMOGLOBIN (HGB A1C): Hemoglobin A1C: 7.1 % — AB (ref 4.0–5.6)

## 2020-06-24 MED ORDER — ACCU-CHEK SOFTCLIX LANCETS MISC: 2 refills | Status: DC

## 2020-06-24 MED ORDER — ACCU-CHEK AVIVA PLUS VI STRP: ORAL_STRIP | 1 refills | Status: DC

## 2020-06-24 NOTE — Progress Notes (Signed)
    Paul Banks is a 75 y.o. male who presents today for an office visit.  Assessment/Plan:  Chronic Problems Addressed Today: Diabetes (HCC) A1c stable at 7.1. Continue metformin 1000mg  twice daily.  Discussed lifestyle modifications. Recheck A1c in 6 months.   Hypertension associated with diabetes (HCC) Well above goal today.  No signs of end organ damage.  Patient reports his home readings typically in the 130s over 80s.  We will continue his current regimen given his good blood pressure control at home.  He will let me know if his blood pressures persistently elevated.  We can compare to his home cuff at his next office visit.  Discussed lifestyle modifications.  Continue metoprolol tartrate 50 mg twice daily, losartan-HCTZ 100-25 daily, and amlodipine 10 mg daily.     Subjective:  HPI:  See A/p.         Objective:  Physical Exam: BP (!) 190/83   Pulse 85   Temp 98.3 F (36.8 C)   Ht 5\' 9"  (1.753 m)   Wt 217 lb 4 oz (98.5 kg)   SpO2 100%   BMI 32.08 kg/m   Gen: No acute distress, resting comfortably Neuro: Grossly normal, moves all extremities Psych: Normal affect and thought content      I,Alexis Bryant,acting as a scribe for , MD.,have documented all relevant documentation on the behalf of , MD,as directed by  Jacquiline Doe, MD while in the presence of Jacquiline Doe, MD.  I, Jacquiline Doe, MD, have reviewed all documentation for this visit. The documentation on 06/24/20 for the exam, diagnosis, procedures, and orders are all accurate and complete.   Jacquiline Doe. 06/26/20, MD 06/24/2020 10:45 AM

## 2020-06-24 NOTE — Assessment & Plan Note (Addendum)
Well above goal today.  No signs of end organ damage.  Patient reports his home readings typically in the 130s over 80s.  We will continue his current regimen given his good blood pressure control at home.  He will let me know if his blood pressures persistently elevated.  We can compare to his home cuff at his next office visit.  Discussed lifestyle modifications.  Continue metoprolol tartrate 50 mg twice daily, losartan-HCTZ 100-25 daily, and amlodipine 10 mg daily.

## 2020-06-24 NOTE — Assessment & Plan Note (Addendum)
A1c stable at 7.1. Continue metformin 1000mg  twice daily.  Discussed lifestyle modifications. Recheck A1c in 6 months.

## 2020-06-24 NOTE — Patient Instructions (Signed)
It was very nice to see you today!  Your A1c is 7.1.    Keep an eye on your blood pressures and let us know if they are persistently 150/90 or higher.  I will refill your diabetic testing supplies.  I will see back in 6 months for your annual physical.  Please come back to see me sooner if needed.  Take care, Dr Jimmey Ralph  PLEASE NOTE:  If you had any lab tests please let us know if you have not heard back within a few days. You may see your results on mychart before we have a chance to review them but we will give you a call once they are reviewed by [place]. If we ordered any referrals today, please let us know if you have not heard from their office within the next week.   Please try these tips to maintain a healthy lifestyle:  Eat at least 3 REAL meals and 1-2 snacks per day.  Aim for no more than 5 hours between eating.  If you eat breakfast, please do so within one hour of getting up.   Each meal should contain half fruits/vegetables, one quarter protein, and one quarter carbs (no bigger than a computer mouse)  Cut down on sweet beverages. This includes juice, soda, and sweet tea.   Drink at least 1 glass of water with each meal and aim for at least 8 glasses per day  Exercise at least 150 minutes every week.

## 2020-06-29 ENCOUNTER — Ambulatory Visit (INDEPENDENT_AMBULATORY_CARE_PROVIDER_SITE_OTHER): Payer: Medicare HMO

## 2020-06-29 DIAGNOSIS — N183 Chronic kidney disease, stage 3 unspecified: Secondary | ICD-10-CM | POA: Diagnosis not present

## 2020-06-29 DIAGNOSIS — I152 Hypertension secondary to endocrine disorders: Secondary | ICD-10-CM | POA: Diagnosis not present

## 2020-06-29 DIAGNOSIS — E1122 Type 2 diabetes mellitus with diabetic chronic kidney disease: Secondary | ICD-10-CM

## 2020-06-29 DIAGNOSIS — E1159 Type 2 diabetes mellitus with other circulatory complications: Secondary | ICD-10-CM

## 2020-06-29 MED ORDER — METFORMIN HCL ER 500 MG PO TB24: ORAL_TABLET | ORAL | 3 refills | Status: DC

## 2020-06-29 NOTE — Progress Notes (Signed)
Chronic Care Management Pharmacy Note  06/29/2020 Name:  Paul Banks MRN:  [id number] DOB:  09/03/1945  Recommendations/Changes made from today's visit: no changes  Subjective: Paul Banks is an 75 y.o. year old male who is a primary patient of Vivi Barrack, MD.  The CCM team was consulted for assistance with disease management and care coordination needs.    Engaged with patient by telephone for follow up visit in response to provider referral for pharmacy case management and/or care coordination services.   Consent to Services:  The patient was given information about Chronic Care Management services, agreed to services, and gave verbal consent prior to initiation of services.  Please see initial visit note for detailed documentation.   Patient Care Team: Vivi Barrack, MD as PCP - General (Family Medicine) Calvert Cantor, MD as Consulting Physician (Ophthalmology) Madelin Rear, [facility] as Pharmacist (Pharmacist)  Objective:  Lab Results  Component Value Date   CREATININE 1.16 12/11/2019   CREATININE 1.17 11/28/2018   CREATININE 1.21 10/18/2017    Lab Results  Component Value Date   HGBA1C 7.1 (A) 06/24/2020   Last diabetic Eye exam: No results found for: HMDIABEYEEXA  Last diabetic Foot exam: No results found for: HMDIABFOOTEX      Component Value Date/Time   CHOL 162 12/11/2019 0933   TRIG 279 (H) 12/11/2019 0933   TRIG 400 (HH) 01/16/2006 0805   HDL 44 12/11/2019 0933   CHOLHDL 3.7 12/11/2019 0933   VLDL 40.6 (H) 11/28/2018 0855   LDLCALC 81 12/11/2019 0933   LDLDIRECT 83.0 11/28/2018 0855    Hepatic Function Latest Ref Rng & Units 12/11/2019 11/28/2018 10/18/2017  Total Protein 6.1 - 8.1 g/dL 8.5(H) 7.8 7.7  Albumin 3.5 - 5.2 g/dL - 4.6 4.5  AST 10 - 35 U/L _0 ALT 9 - 46 U/L _1 Alk Phosphatase 39 - 117 U/L - 62 60  Total Bilirubin 0.2 - 1.2 mg/dL 0.6 0.9 1.1  Bilirubin, Direct 0.0 - 0.3 mg/dL - - 0.2    Lab Results  Component  Value Date/Time   TSH 2.29 12/11/2019 09:33 AM   TSH 1.49 11/28/2018 08:55 AM    CBC Latest Ref Rng & Units 12/11/2019 11/28/2018 10/18/2017  WBC 3.8 - 10.8 Thousand/uL 9.8 9.8 9.3  Hemoglobin 13.2 - 17.1 g/dL 15.8 15.5 15.2  Hematocrit 38.5 - 50.0 % 46.9 45.3 44.0  Platelets 140 - 400 Thousand/uL 308 283.0 240.0    Lab Results  Component Value Date/Time   VD25OH 36.76 10/18/2017 08:47 AM    Clinical ASCVD:  The 10-year ASCVD risk score Mikey Bussing DC Jr., et al., 2013) is: 66.6%   Values used to calculate the score:     Age: 72 years     Sex: Male     Is Non-Hispanic African American: No     Diabetic: Yes     Tobacco smoker: No     Systolic Blood Pressure: 451 mmHg     Is BP treated: Yes     HDL Cholesterol: 44 mg/dL     Total Cholesterol: 162 mg/dL    Other: (CHADS2VASc if Afib, PHQ9 if depression, MMRC or CAT for COPD, ACT, DEXA)  Social History   Tobacco Use  Smoking Status Never  Smokeless Tobacco Never   BP Readings from Last 3 Encounters:  06/24/20 (!) 190/83  12/11/19 (!) 197/92  08/26/19 138/87   Pulse Readings from Last 3 Encounters:  06/24/20 85  12/11/19 76  06/12/19 72   Wt Readings from Last 3 Encounters:  06/24/20 217 lb 4 oz (98.5 kg)  01/08/20 210 lb (95.3 kg)  12/11/19 214 lb 12.8 oz (97.4 kg)    Assessment: Review of patient past medical history, allergies, medications, health status, including review of consultants reports, laboratory and other test data, was performed as part of comprehensive evaluation and provision of chronic care management services.   SDOH:  (Social Determinants of Health) assessments and interventions performed: Yes   CCM Care Plan  Allergies  Allergen Reactions   Penicillins     REACTION: Rash   Pioglitazone     REACTION: edema   Tetracycline     REACTION: Rash    Medications Reviewed Today     Reviewed by Loralyn Freshwater, CMA (Certified Medical Assistant) on 06/24/20 at 34  Med List Status: <None>    Medication Order Taking? Sig Documenting Provider Last Dose Status Informant  ACCU-CHEK AVIVA PLUS test strip [id number] Yes USE TO CHECK BLOOD SUGAR 1 TIME PER DAY Renato Shin, MD Taking Active   ACCU-CHEK SOFTCLIX LANCETS lancets [id number] Yes USE TO CHECK BLOOD SUGAR 1 TIME PER DAY Renato Shin, MD Taking Active   allopurinol (ZYLOPRIM) 100 MG tablet [id number] Yes TAKE 1/2 TABLET BY MOUTH DAILY Vivi Barrack, MD Taking Active   amLODipine (NORVASC) 10 MG tablet [id number] Yes Take 1 tablet (10 mg total) by mouth daily. Vivi Barrack, MD Taking Active   aspirin 81 MG tablet [id number] Yes Take 81 mg by mouth daily. [provider] Taking Active   atorvastatin (LIPITOR) 10 MG tablet [id number] Yes TAKE ONE TABLET BY MOUTH DAILY Vivi Barrack, MD Taking Active   azelastine (ASTELIN) 0.1 % nasal spray [id number] Yes Place 2 sprays into both nostrils 2 (two) times daily. Vivi Barrack, MD Taking Active   Fish Oil-Cholecalciferol [facility] OIL + D3 PO) [id number] Yes Take by mouth. [provider] Taking Active   levothyroxine (SYNTHROID) 137 MCG tablet [id number] Yes TAKE ONE TABLET BY MOUTH DAILY BEFORE BREAKFAST Vivi Barrack, MD Taking Active   losartan-hydrochlorothiazide [facility]) 100-25 MG tablet [id number] Yes TAKE ONE TABLET BY MOUTH DAILY Vivi Barrack, MD Taking Active   metFORMIN (GLUCOPHAGE-XR) 500 MG 24 hr tablet [id number] Yes Take 2 tablets (1,000 mg total) by mouth 2 (two) times daily. Vivi Barrack, MD Taking Active   metoprolol tartrate (LOPRESSOR) 50 MG tablet [id number] Yes Take 1 tablet (50 mg total) by mouth 2 (two) times daily. Vivi Barrack, MD Taking Active   Multiple Vitamins-Minerals [facility] COMPLETE PO) [id number] Yes Take by mouth. [provider] Taking Active   potassium chloride (KLOR-CON) 8 MEQ tablet [id number] Yes Take 1 tablet (8 mEq total) by mouth daily. Vivi Barrack, MD Taking Active   timolol (TIMOPTIC) 0.5 % ophthalmic solution  [id number] Yes  [provider] Taking Active             Patient Active Problem List   Diagnosis Date Noted   Hypertension associated with diabetes ([place]) 11/08/2017   Diabetes ([place]) 03/04/2015   Hypercalcemia 10/10/2012   Renal insufficiency 10/10/2012   Gout 07/01/2009   COUGH DUE TO ACE INHIBITORS 09/10/2008   NASH (nonalcoholic steatohepatitis) 06/12/2008   Dyslipidemia associated with type 2 diabetes mellitus ([place]) 05/02/2007   Hypothyroidism 08/17/2006    Immunization History  Administered Date(s) Administered   Fluad Quad(high Dose 65+) 12/11/2019   Hepatitis A, Adult 09/24/2018   Influenza, Quadrivalent, Recombinant,  Inj, Pf 12/02/2018   Influenza-Unspecified 10/10/2012   Janssen (J&J) SARS-COV-2 Vaccination 03/22/2019   Pneumococcal Conjugate-13 11/13/2013   Pneumococcal Polysaccharide-23 09/08/2010, 11/08/2017   Td 01/10/2002   Tdap 11/13/2013   Zoster Recombinat (Shingrix) 09/24/2018, 12/02/2018   Zoster, Live 03/04/2015    Conditions to be addressed/monitored: HTN HLD T2DM  Care Plan : [place]  Updates made by Madelin Rear, [facility] since 06/29/2020 12:00 AM     Problem: HTN HLD DMII Hypothyroidism   Priority: High     Long-Range Goal: Disease Management   Note:    Current Barriers:  Unable to maintain control of BP  Pharmacist Clinical Goal(s):  Patient will contact provider office for questions/concerns as evidenced notation of same in electronic health record through collaboration with PharmD and provider.   Interventions: 1:1 collaboration with Vivi Barrack, MD regarding development and update of comprehensive plan of care as evidenced by provider attestation and co-signature Inter-disciplinary care team collaboration (see longitudinal plan of care) Comprehensive medication review performed; medication list updated in electronic medical record  Hypertension (BP goal <140/90) -Not ideally controlled -Denies sleep apnea,  no sleep study -Current treatment: Metoprolol tartrate 50 mg twice daily Amlodipine 10 mg daily Losartan-Hydrochlorothiazide 100-79m once daily -Current home readings: [id number] -Denies hypotensive/hypertensive symptoms -Educated on BP goals and benefits of medications for prevention of heart attack, stroke and kidney damage; Daily salt intake goal < 2300 mg; Exercise goal of 150 minutes per week; Proper BP monitoring technique; -Counseled to monitor BP at home with 2 back to back readings, document, and provide log at future appointments -Counseled on diet and exercise extensively Recommended to continue current medication  Diabetes (A1c goal <7%) -Controlled -Current medications: Metformin XR 500 mg - prescribed as two tablets twice daily. Patient taking 1500 mg/day -Medications previously tried: n/a  -Current home glucose readings fasting glucose: did not obtain post prandial glucose: did not obtain -Denies hypoglycemic/hyperglycemic symptoms -Current meal patterns: watches carbs, some sweets like chocolate, grains, minimal processed foods -Current exercise: very active 2-4 miles walking multiple times per week -Recommended to continue current medication  Patient Goals/Self-Care Activities Patient will:  - take medications as prescribed engage in dietary modifications by minimizing sodium intake   Follow Up Plan: 1 month CPA BP call, RPH f/u 3 months  Medication Assistance: None required.  Patient affirms current coverage meets needs.     [id number] [place]Phone: [phone]: [phone] Follow Up:  Patient agrees to Care Plan and Follow-up.  Future Appointments  Date Time Provider [place] 09/29/2020  9:00 AM [facility] CCM PHARMACIST [facility]  12/16/2020  8:20 AM PVivi Barrack MD [facility]   JMadelin Rear PharmD, CPP Clinical Pharmacist Practitioner  [facility]  [phone]

## 2020-06-29 NOTE — Patient Instructions (Signed)
Paul Banks,  Thank you for talking with me today. I have included our care plan/goals in the following pages.   Please review and call me at [phone] with any questions.  Thanks! Paul Banks, Pharm.D., BCGP Clinical Pharmacist [facility] [place] [phone] Patient Care Plan: CCM Pharmacy Care Plan     Problem Identified: HTN HLD DMII Hypothyroidism   Priority: High     Long-Range Goal: Disease Management   Note:    Current Barriers:  Unable to maintain control of BP  Pharmacist Clinical Goal(s):  Patient will contact provider office for questions/concerns as evidenced notation of same in electronic health record through collaboration with PharmD and provider.   Interventions: 1:1 collaboration with Ardith Dark, MD regarding development and update of comprehensive plan of care as evidenced by provider attestation and co-signature Inter-disciplinary care team collaboration (see longitudinal plan of care) Comprehensive medication review performed; medication list updated in electronic medical record  Hypertension (BP goal <140/90) -Not ideally controlled -Denies sleep apnea, no sleep study -Current treatment: Metoprolol tartrate 50 mg twice daily Amlodipine 10 mg daily Losartan-Hydrochlorothiazide 100-25mg  once daily -Current home readings: 130s-140 systolic -Denies hypotensive/hypertensive symptoms -Educated on BP goals and benefits of medications for prevention of heart attack, stroke and kidney damage; Daily salt intake goal < 2300 mg; Exercise goal of 150 minutes per week; Proper BP monitoring technique; -Counseled to monitor BP at home with 2 back to back readings, document, and provide log at future appointments -Counseled on diet and exercise extensively Recommended to continue current medication  Diabetes (A1c goal <7%) -Controlled -Current medications: Metformin XR 500 mg - prescribed as two  tablets twice daily. Patient taking 1500 mg/day -Medications previously tried: n/a  -Current home glucose readings fasting glucose: did not obtain post prandial glucose: did not obtain -Denies hypoglycemic/hyperglycemic symptoms -Current meal patterns: watches carbs, some sweets like chocolate, grains, minimal processed foods -Current exercise: very active 2-4 miles walking multiple times per week -Recommended to continue current medication  Patient Goals/Self-Care Activities Patient will:  - take medications as prescribed engage in dietary modifications by minimizing sodium intake   Follow Up Plan: 1 month CPA BP call, RPH f/u 3 months  Medication Assistance: None required.  Patient affirms current coverage meets needs.   The patient verbalized understanding of instructions provided today and agreed to receive a MyChart copy of patient instruction and/or educational materials. Telephone follow up appointment with pharmacy team member scheduled for: See next appointment with "Care Management Staff" under "What's Next" below.

## 2020-07-24 ENCOUNTER — Telehealth: Payer: Self-pay | Admitting: Pharmacist

## 2020-07-24 NOTE — Chronic Care Management (AMB) (Signed)
Chronic Care Management Pharmacy Assistant   Name: Paul Banks  MRN: [id number] DOB: 09/20/1945  Reason for Encounter: Hypertension Disease State call   Recent office visits:  None  Recent consult visits:  None  Hospital visits:  None in previous 6 months  Medications: Outpatient Encounter Medications as of 07/24/2020  Medication Sig   Accu-Chek Softclix Lancets lancets USE TO CHECK BLOOD SUGAR 1 TIME PER DAY   allopurinol (ZYLOPRIM) 100 MG tablet TAKE 1/2 TABLET BY MOUTH DAILY   amLODipine (NORVASC) 10 MG tablet Take 1 tablet (10 mg total) by mouth daily.   aspirin 81 MG tablet Take 81 mg by mouth daily.   atorvastatin (LIPITOR) 10 MG tablet TAKE ONE TABLET BY MOUTH DAILY   azelastine (ASTELIN) 0.1 % nasal spray Place 2 sprays into both nostrils 2 (two) times daily.   Fish Oil-Cholecalciferol (FISH OIL + D3 PO) Take by mouth.   glucose blood (ACCU-CHEK AVIVA PLUS) test strip USE TO CHECK BLOOD SUGAR 1 TIME PER DAY   levothyroxine (SYNTHROID) 137 MCG tablet TAKE ONE TABLET BY MOUTH DAILY BEFORE BREAKFAST   losartan-hydrochlorothiazide (HYZAAR) 100-25 MG tablet TAKE ONE TABLET BY MOUTH DAILY   metFORMIN (GLUCOPHAGE-XR) 500 MG 24 hr tablet Take one tablet (500 mg) in the morning with breakfast and two tablets (1000 mg) in evening with dinner   metoprolol tartrate (LOPRESSOR) 50 MG tablet Take 1 tablet (50 mg total) by mouth 2 (two) times daily.   Multiple Vitamins-Minerals (MULTI COMPLETE PO) Take by mouth.   potassium chloride (KLOR-CON) 8 MEQ tablet Take 1 tablet (8 mEq total) by mouth daily.   timolol (TIMOPTIC) 0.5 % ophthalmic solution    No facility-administered encounter medications on file as of 07/24/2020.   Reviewed chart prior to disease state call. Spoke with patient regarding BP  Recent Office Vitals: BP Readings from Last 3 Encounters:  06/24/20 (!) 190/83  12/11/19 (!) 197/92  08/26/19 138/87   Pulse Readings from Last 3 Encounters:  06/24/20 85   12/11/19 76  06/12/19 72    Wt Readings from Last 3 Encounters:  06/24/20 217 lb 4 oz (98.5 kg)  01/08/20 210 lb (95.3 kg)  12/11/19 214 lb 12.8 oz (97.4 kg)     Kidney Function Lab Results  Component Value Date/Time   CREATININE 1.16 12/11/2019 09:33 AM   CREATININE 1.17 11/28/2018 08:55 AM   CREATININE 1.21 10/18/2017 08:47 AM   GFR 61.37 11/28/2018 08:55 AM   GFRNONAA 53.25 06/29/2009 08:48 AM   GFRAA 62 04/27/2007 07:47 AM    BMP Latest Ref Rng & Units 12/11/2019 11/28/2018 10/18/2017  Glucose 65 - 99 mg/dL 297(L) 892(J) -  BUN 7 - 25 mg/dL 20 19 -  Creatinine 1.94 - 1.18 mg/dL 1.74 0.81 -  BUN/Creat Ratio 6 - 22 (calc) NOT APPLICABLE - -  Sodium 135 - 146 mmol/L 137 138 -  Potassium 3.5 - 5.3 mmol/L 4.3 4.2 -  Chloride 98 - 110 mmol/L 98 96 -  CO2 20 - 32 mmol/L 30 30 -  Calcium 8.6 - 10.3 mg/dL 10.6(H) 10.0 9.8    Current antihypertensive regimen:  Metoprolol tartrate 50 mg take 1 twice daily Amlodipine 10 mg daily take 1 daily (patient reports he takes 1/2 bid) Losartan-Hydrochlorothiazide 100-25mg  once daily How often are you checking your Blood Pressure? twice daily Current home BP readings: 141/82 most recent.  What recent interventions/DTPs have been made by any provider to improve Blood Pressure control since last CPP Visit:  None Any recent hospitalizations or ED visits since last visit with CPP? No What diet changes have been made to improve Blood Pressure Control?  Patient stated he is very conscious about what he eats.  Patient tries to adhere to a healthy diet.  What exercise is being done to improve your Blood Pressure Control?  Patient loves to walk.  He walks a minimum of 2 miles per day for 5 times per week. 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   Adherence Review: Is the patient currently on ACE/ARB medication? Yes Does the patient have >5 day gap between last estimated fill dates? No (Patient just picked up amlodipine and  metformin.)  Amlodipine 10mg   Last filled 04/18/20  90DS Metoprolol 50mg   Last filled 06/21/20  90 DS   Star Rating Drugs: Atorvastatin 10mg   Last filled 06/08/20  90 DS Losartan-hydrochlorthiazide 100-25mg   Last filled 06/08/20  90 DS Metformin 500mg   Last filled 04/13/20  90 DS  06/10/20 CMA [facility]

## 2020-07-27 DIAGNOSIS — H401131 Primary open-angle glaucoma, bilateral, mild stage: Secondary | ICD-10-CM | POA: Diagnosis not present

## 2020-07-27 DIAGNOSIS — H04123 Dry eye syndrome of bilateral lacrimal glands: Secondary | ICD-10-CM | POA: Diagnosis not present

## 2020-07-27 DIAGNOSIS — H25813 Combined forms of age-related cataract, bilateral: Secondary | ICD-10-CM | POA: Diagnosis not present

## 2020-09-04 ENCOUNTER — Telehealth: Payer: Self-pay | Admitting: Pharmacist

## 2020-09-04 NOTE — Chronic Care Management (AMB) (Addendum)
    Chronic Care Management Pharmacy Assistant   Name: Paul Banks  MRN: [id number] DOB: 09-Nov-1945   Reason for Encounter: General Adherence Call    Recent office visits:  None  Recent consult visits:  None  Hospital visits:  None in previous 6 months  Medications: Outpatient Encounter Medications as of 09/04/2020  Medication Sig   Accu-Chek Softclix Lancets lancets USE TO CHECK BLOOD SUGAR 1 TIME PER DAY   allopurinol (ZYLOPRIM) 100 MG tablet TAKE 1/2 TABLET BY MOUTH DAILY   amLODipine (NORVASC) 10 MG tablet Take 1 tablet (10 mg total) by mouth daily.   aspirin 81 MG tablet Take 81 mg by mouth daily.   atorvastatin (LIPITOR) 10 MG tablet TAKE ONE TABLET BY MOUTH DAILY   azelastine (ASTELIN) 0.1 % nasal spray Place 2 sprays into both nostrils 2 (two) times daily.   Fish Oil-Cholecalciferol (FISH OIL + D3 PO) Take by mouth.   glucose blood (ACCU-CHEK AVIVA PLUS) test strip USE TO CHECK BLOOD SUGAR 1 TIME PER DAY   levothyroxine (SYNTHROID) 137 MCG tablet TAKE ONE TABLET BY MOUTH DAILY BEFORE BREAKFAST   losartan-hydrochlorothiazide (HYZAAR) 100-25 MG tablet TAKE ONE TABLET BY MOUTH DAILY   metFORMIN (GLUCOPHAGE-XR) 500 MG 24 hr tablet Take one tablet (500 mg) in the morning with breakfast and two tablets (1000 mg) in evening with dinner   metoprolol tartrate (LOPRESSOR) 50 MG tablet Take 1 tablet (50 mg total) by mouth 2 (two) times daily.   Multiple Vitamins-Minerals (MULTI COMPLETE PO) Take by mouth.   potassium chloride (KLOR-CON) 8 MEQ tablet Take 1 tablet (8 mEq total) by mouth daily.   timolol (TIMOPTIC) 0.5 % ophthalmic solution    No facility-administered encounter medications on file as of 09/04/2020.   Patient Questions: Have you had any problems recently with your health? Patient states he has not had any problems recently with his health.  Have you had any problems with your pharmacy? Patient states he has not had any problems recently with his  pharmacy.  What issues or side effects are you having with your medications? Patient states he is not currently having any issues or side effects with any of his medications.  What would you like me to pass along to Erskine Emery, CPP for him to help you with?  Patient states he does not have anything to pass along at this time.  What can we do to take care of you better? Patient did not have any suggestions.  Future Appointments  Date Time Provider Department Center  09/29/2020  9:00 AM [facility] CCM PHARMACIST [facility]  12/16/2020  8:20 AM Ardith Dark, MD [facility]    Star Rating Drugs: Atorvastatin last filled 06/08/2020 90 DS  April D Calhoun, [facility] Clinical Pharmacist Assistant [phone]

## 2020-09-06 ENCOUNTER — Other Ambulatory Visit: Payer: Self-pay | Admitting: Family Medicine

## 2020-09-08 ENCOUNTER — Other Ambulatory Visit: Payer: Self-pay | Admitting: Family Medicine

## 2020-09-21 DIAGNOSIS — H04123 Dry eye syndrome of bilateral lacrimal glands: Secondary | ICD-10-CM | POA: Diagnosis not present

## 2020-09-21 DIAGNOSIS — H401131 Primary open-angle glaucoma, bilateral, mild stage: Secondary | ICD-10-CM | POA: Diagnosis not present

## 2020-09-29 ENCOUNTER — Ambulatory Visit (INDEPENDENT_AMBULATORY_CARE_PROVIDER_SITE_OTHER): Payer: Medicare HMO | Admitting: Pharmacist

## 2020-09-29 VITALS — BP 121/84

## 2020-09-29 DIAGNOSIS — N183 Chronic kidney disease, stage 3 unspecified: Secondary | ICD-10-CM

## 2020-09-29 DIAGNOSIS — I152 Hypertension secondary to endocrine disorders: Secondary | ICD-10-CM

## 2020-09-29 DIAGNOSIS — E1159 Type 2 diabetes mellitus with other circulatory complications: Secondary | ICD-10-CM

## 2020-09-29 NOTE — Progress Notes (Signed)
Chronic Care Management Pharmacy Note  09/29/2020 Name:  Paul Banks MRN:  [id number] DOB:  December 10, 1945  Recommendations/Changes made from today's visit: no changes  Subjective: Paul Banks is an 75 y.o. year old male who is a primary patient of Vivi Barrack, MD.  The CCM team was consulted for assistance with disease management and care coordination needs.    Engaged with patient by telephone for follow up visit in response to provider referral for pharmacy case management and/or care coordination services.   Consent to Services:  The patient was given information about Chronic Care Management services, agreed to services, and gave verbal consent prior to initiation of services.  Please see initial visit note for detailed documentation.   Patient Care Team: Vivi Barrack, MD as PCP - General (Family Medicine) Calvert Cantor, MD as Consulting Physician (Ophthalmology) Edythe Clarity, [facility] (Pharmacist)  Objective:  Lab Results  Component Value Date   CREATININE 1.16 12/11/2019   CREATININE 1.17 11/28/2018   CREATININE 1.21 10/18/2017    Lab Results  Component Value Date   HGBA1C 7.1 (A) 06/24/2020   Last diabetic Eye exam: No results found for: HMDIABEYEEXA  Last diabetic Foot exam: No results found for: HMDIABFOOTEX      Component Value Date/Time   CHOL 162 12/11/2019 0933   TRIG 279 (H) 12/11/2019 0933   TRIG 400 (HH) 01/16/2006 0805   HDL 44 12/11/2019 0933   CHOLHDL 3.7 12/11/2019 0933   VLDL 40.6 (H) 11/28/2018 0855   LDLCALC 81 12/11/2019 0933   LDLDIRECT 83.0 11/28/2018 0855    Hepatic Function Latest Ref Rng & Units 12/11/2019 11/28/2018 10/18/2017  Total Protein 6.1 - 8.1 g/dL 8.5(H) 7.8 7.7  Albumin 3.5 - 5.2 g/dL - 4.6 4.5  AST 10 - 35 U/L _0 ALT 9 - 46 U/L _1 Alk Phosphatase 39 - 117 U/L - 62 60  Total Bilirubin 0.2 - 1.2 mg/dL 0.6 0.9 1.1  Bilirubin, Direct 0.0 - 0.3 mg/dL - - 0.2    Lab Results  Component Value  Date/Time   TSH 2.29 12/11/2019 09:33 AM   TSH 1.49 11/28/2018 08:55 AM    CBC Latest Ref Rng & Units 12/11/2019 11/28/2018 10/18/2017  WBC 3.8 - 10.8 Thousand/uL 9.8 9.8 9.3  Hemoglobin 13.2 - 17.1 g/dL 15.8 15.5 15.2  Hematocrit 38.5 - 50.0 % 46.9 45.3 44.0  Platelets 140 - 400 Thousand/uL 308 283.0 240.0    Lab Results  Component Value Date/Time   VD25OH 36.76 10/18/2017 08:47 AM    Clinical ASCVD:  The 10-year ASCVD risk score (Arnett DK, et al., 2019) is: 38.6%   Values used to calculate the score:     Age: 62 years     Sex: Male     Is Non-Hispanic African American: No     Diabetic: Yes     Tobacco smoker: No     Systolic Blood Pressure: 092 mmHg     Is BP treated: Yes     HDL Cholesterol: 44 mg/dL     Total Cholesterol: 162 mg/dL    Other: (CHADS2VASc if Afib, PHQ9 if depression, MMRC or CAT for COPD, ACT, DEXA)  Social History   Tobacco Use  Smoking Status Never  Smokeless Tobacco Never   BP Readings from Last 3 Encounters:  09/27/20 121/84  06/24/20 (!) 190/83  12/11/19 (!) 197/92   Pulse Readings from Last 3 Encounters:  06/24/20 85  12/11/19 76  06/12/19 72   Wt Readings from Last 3 Encounters:  06/24/20 217 lb 4 oz (98.5 kg)  01/08/20 210 lb (95.3 kg)  12/11/19 214 lb 12.8 oz (97.4 kg)    Assessment: Review of patient past medical history, allergies, medications, health status, including review of consultants reports, laboratory and other test data, was performed as part of comprehensive evaluation and provision of chronic care management services.   SDOH:  (Social Determinants of Health) assessments and interventions performed: Yes   CCM Care Plan  Allergies  Allergen Reactions   Penicillins     REACTION: Rash   Pioglitazone     REACTION: edema   Tetracycline     REACTION: Rash    Medications Reviewed Today     Reviewed by Edythe Clarity, [facility] (Pharmacist) on 09/29/20 at 1033  Med List Status: <None>   Medication Order Taking?  Sig Documenting Provider Last Dose Status Informant  Accu-Chek Softclix Lancets lancets [id number] Yes USE TO CHECK BLOOD SUGAR 1 TIME PER DAY Vivi Barrack, MD Taking Active   allopurinol (ZYLOPRIM) 100 MG tablet [id number] Yes TAKE 1/2 TABLET BY MOUTH DAILY Vivi Barrack, MD Taking Active   amLODipine (NORVASC) 10 MG tablet [id number] Yes Take 1 tablet (10 mg total) by mouth daily. Vivi Barrack, MD Taking Active   aspirin 81 MG tablet [id number] Yes Take 81 mg by mouth daily. [provider] Taking Active   atorvastatin (LIPITOR) 10 MG tablet [id number] Yes TAKE ONE TABLET BY MOUTH DAILY Vivi Barrack, MD Taking Active   azelastine (ASTELIN) 0.1 % nasal spray [id number] Yes Place 2 sprays into both nostrils 2 (two) times daily. Vivi Barrack, MD Taking Active   Fish Oil-Cholecalciferol [facility] OIL + D3 PO) [id number] Yes Take by mouth. [provider] Taking Active   glucose blood (ACCU-CHEK AVIVA PLUS) test strip [id number] Yes USE TO CHECK BLOOD SUGAR 1 TIME PER DAY Vivi Barrack, MD Taking Active   levothyroxine (SYNTHROID) 137 MCG tablet [id number] Yes TAKE ONE TABLET BY MOUTH DAILY BEFORE BREAKFAST Vivi Barrack, MD Taking Active   losartan-hydrochlorothiazide [facility]) 100-25 MG tablet [id number] Yes TAKE ONE TABLET BY MOUTH DAILY Vivi Barrack, MD Taking Active   metFORMIN (GLUCOPHAGE-XR) 500 MG 24 hr tablet [id number] Yes Take one tablet (500 mg) in the morning with breakfast and two tablets (1000 mg) in evening with dinner Vivi Barrack, MD Taking Active   metoprolol tartrate (LOPRESSOR) 50 MG tablet [id number] Yes Take 1 tablet (50 mg total) by mouth 2 (two) times daily. Vivi Barrack, MD Taking Active   Multiple Vitamins-Minerals [facility] COMPLETE PO) [id number] Yes Take by mouth. [provider] Taking Active   potassium chloride (KLOR-CON) 8 MEQ tablet [id number] Yes TAKE ONE TABLET BY MOUTH DAILY Vivi Barrack, MD Taking Active   timolol (TIMOPTIC) 0.5 %  ophthalmic solution [id number] Yes  [provider] Taking Active             Patient Active Problem List   Diagnosis Date Noted   Hypertension associated with diabetes ([place]) 11/08/2017   Diabetes ([place]) 03/04/2015   Hypercalcemia 10/10/2012   Renal insufficiency 10/10/2012   Gout 07/01/2009   COUGH DUE TO ACE INHIBITORS 09/10/2008   NASH (nonalcoholic steatohepatitis) 06/12/2008   Dyslipidemia associated with type 2 diabetes mellitus ([place]) 05/02/2007   Hypothyroidism 08/17/2006    Immunization History  Administered Date(s) Administered   Fluad Quad(high Dose 65+) 12/11/2019   Hepatitis A, Adult 09/24/2018  Influenza, Quadrivalent, Recombinant, Inj, Pf 12/02/2018   Influenza-Unspecified 10/10/2012   Janssen (J&J) SARS-COV-2 Vaccination 03/22/2019   Pneumococcal Conjugate-13 11/13/2013   Pneumococcal Polysaccharide-23 09/08/2010, 11/08/2017   Td 01/10/2002   Tdap 11/13/2013   Zoster Recombinat (Shingrix) 09/24/2018, 12/02/2018   Zoster, Live 03/04/2015    Conditions to be addressed/monitored: HTN HLD T2DM  Care Plan : [place]  Updates made by Edythe Clarity, RPH since 09/29/2020 12:00 AM     Problem: HTN HLD DMII Hypothyroidism   Priority: High     Long-Range Goal: Disease Management   Start Date: 06/29/2020  Expected End Date: 06/29/2021  Recent Progress: On track  Priority: High  Note:    Current Barriers:  Unable to maintain control of BP  Pharmacist Clinical Goal(s):  Patient will contact provider office for questions/concerns as evidenced notation of same in electronic health record through collaboration with PharmD and provider.   Interventions: 1:1 collaboration with Vivi Barrack, MD regarding development and update of comprehensive plan of care as evidenced by provider attestation and co-signature Inter-disciplinary care team collaboration (see longitudinal plan of care) Comprehensive medication review performed;  medication list updated in electronic medical record  Hypertension (BP goal <140/90) -Not ideally controlled -Denies sleep apnea or previous sleep study -No echo on file  -Current treatment: Metoprolol tartrate 50 mg twice daily Amlodipine 10 mg daily Losartan-Hydrochlorothiazide 100-6m once daily -Current home readings: [id number] -Denies hypotensive/hypertensive symptoms -Educated on BP goals and benefits of medications for prevention of heart attack, stroke and kidney damage; Daily salt intake goal < 2300 mg; Exercise goal of 150 minutes per week; Proper BP monitoring technique; -Counseled to monitor BP at home with 2 back to back readings, document, and provide log at future appointments -Counseled on diet and exercise extensively Recommended to continue current medication  Update 09/29/20 Recent BP readings - last BP Sunday 121/84 P 66 137/87 P 61, 140/85 P 59, 132/82 P 61 Reports that since he started new drops in his eyes he feels his BP has improved.  Denies any dizziness or HA's Continues to be active. Continue current meds for now - will continue to follow BP.  Diabetes (A1c goal <7%) -Controlled -Current medications: Metformin XR 500 mg - prescribed as two tablets twice daily. Patient taking 1500 mg/day -Medications previously tried: n/a  -Current home glucose readings fasting glucose: did not obtain post prandial glucose: did not obtain -Denies hypoglycemic/hyperglycemic symptoms -Current meal patterns: watches carbs, some sweets like chocolate, grains, minimal processed foods -Current exercise: very active 2-4 miles walking multiple times per week -Recommended to continue current medication  Update 09/29/20 120, 103, 97, 99, 115, 132, 117 are recent glucose readings Congratulated him on good readings. Continues to take 15068mof metformin daily. Discussed diet which seems to be appropriate for maintaining euglycemia. Continue current meds for now -  recheck A1c.  Patient Goals/Self-Care Activities Patient will:  - take medications as prescribed engage in dietary modifications by minimizing sodium intake   Follow Up Plan: FU 4 months, CMA 2 months on glucose/BP  Medication Assistance: None required.  Patient affirms current coverage meets needs.         Patient's preferred pharmacy is:  [id number] [address] [place]: [phone]: [phone] Up:  Patient agrees to Care Plan and Follow-up.  Future Appointments  Date Time Provider De[place]12/07/2020  8:20 AM PaJerline Pain  Algis Greenhouse, MD [facility], PharmD Clinical Pharmacist [phone]

## 2020-09-29 NOTE — Patient Instructions (Addendum)
Visit Information   Goals Addressed             This Visit's Progress    Track and Manage My Blood Pressure-Hypertension       Timeframe:  Long-Range Goal Priority:  High Start Date: 09/29/20                            Expected End Date: 03/29/21                      Follow Up Date 12/29/20    - check blood pressure weekly - choose a place to take my blood pressure (home, clinic or office, retail store) - write blood pressure results in a log or diary    Why is this important?   You won't feel high blood pressure, but it can still hurt your blood vessels.  High blood pressure can cause heart or kidney problems. It can also cause a stroke.  Making lifestyle changes like losing a little weight or eating less salt will help.  Checking your blood pressure at home and at different times of the day can help to control blood pressure.  If the doctor prescribes medicine remember to take it the way the doctor ordered.  Call the office if you cannot afford the medicine or if there are questions about it.     Notes:        Patient Care Plan: CCM Pharmacy Care Plan     Problem Identified: HTN HLD DMII Hypothyroidism   Priority: High     Long-Range Goal: Disease Management   Start Date: 06/29/2020  Expected End Date: 06/29/2021  Recent Progress: On track  Priority: High  Note:    Current Barriers:  Unable to maintain control of BP  Pharmacist Clinical Goal(s):  Patient will contact provider office for questions/concerns as evidenced notation of same in electronic health record through collaboration with PharmD and provider.   Interventions: 1:1 collaboration with Paul Dark, Paul Banks regarding development and update of comprehensive plan of care as evidenced by provider attestation and co-signature Inter-disciplinary care team collaboration (see longitudinal plan of care) Comprehensive medication review performed; medication list updated in electronic medical  record  Hypertension (BP goal <140/90) -Not ideally controlled -Denies sleep apnea or previous sleep study -No echo on file  -Current treatment: Metoprolol tartrate 50 mg twice daily Amlodipine 10 mg daily Losartan-Hydrochlorothiazide 100-25mg  once daily -Current home readings: 130s-140 systolic -Denies hypotensive/hypertensive symptoms -Educated on BP goals and benefits of medications for prevention of heart attack, stroke and kidney damage; Daily salt intake goal < 2300 mg; Exercise goal of 150 minutes per week; Proper BP monitoring technique; -Counseled to monitor BP at home with 2 back to back readings, document, and provide log at future appointments -Counseled on diet and exercise extensively Recommended to continue current medication  Update 09/29/20 Recent BP readings - last BP Sunday 121/84 P 66 137/87 P 61, 140/85 P 59, 132/82 P 61 Reports that since he started new drops in his eyes he feels his BP has improved.  Denies any dizziness or HA's Continues to be active. Continue current meds for now - will continue to follow BP.  Diabetes (A1c goal <7%) -Controlled -Current medications: Metformin XR 500 mg - prescribed as two tablets twice daily. Patient taking 1500 mg/day -Medications previously tried: n/a  -Current home glucose readings fasting glucose: did not obtain post prandial glucose: did not obtain -Denies  hypoglycemic/hyperglycemic symptoms -Current meal patterns: watches carbs, some sweets like chocolate, grains, minimal processed foods -Current exercise: very active 2-4 miles walking multiple times per week -Recommended to continue current medication  Update 09/29/20 120, 103, 97, 99, 115, 132, 117 are recent glucose readings Congratulated him on good readings. Continues to take 1500mg  of metformin daily. Discussed diet which seems to be appropriate for maintaining euglycemia. Continue current meds for now - recheck A1c.  Patient Goals/Self-Care  Activities Patient will:  - take medications as prescribed engage in dietary modifications by minimizing sodium intake   Follow Up Plan: FU 4 months, CMA 2 months on glucose/BP  Medication Assistance: None required.  Patient affirms current coverage meets needs.         Patient verbalizes understanding of instructions provided today and agrees to view in MyChart.  Telephone follow up appointment with pharmacy team member scheduled for: 6 months  , [name]  [place]

## 2020-10-09 DIAGNOSIS — E1122 Type 2 diabetes mellitus with diabetic chronic kidney disease: Secondary | ICD-10-CM

## 2020-10-09 DIAGNOSIS — E1159 Type 2 diabetes mellitus with other circulatory complications: Secondary | ICD-10-CM | POA: Diagnosis not present

## 2020-10-09 DIAGNOSIS — I152 Hypertension secondary to endocrine disorders: Secondary | ICD-10-CM | POA: Diagnosis not present

## 2020-10-09 DIAGNOSIS — N183 Chronic kidney disease, stage 3 unspecified: Secondary | ICD-10-CM | POA: Diagnosis not present

## 2020-11-30 DIAGNOSIS — H25813 Combined forms of age-related cataract, bilateral: Secondary | ICD-10-CM | POA: Diagnosis not present

## 2020-11-30 DIAGNOSIS — H04123 Dry eye syndrome of bilateral lacrimal glands: Secondary | ICD-10-CM | POA: Diagnosis not present

## 2020-11-30 DIAGNOSIS — H401131 Primary open-angle glaucoma, bilateral, mild stage: Secondary | ICD-10-CM | POA: Diagnosis not present

## 2020-12-13 ENCOUNTER — Other Ambulatory Visit: Payer: Self-pay | Admitting: Family Medicine

## 2020-12-15 ENCOUNTER — Other Ambulatory Visit: Payer: Self-pay | Admitting: Family Medicine

## 2020-12-16 ENCOUNTER — Other Ambulatory Visit: Payer: Self-pay

## 2020-12-16 ENCOUNTER — Encounter: Payer: Self-pay | Admitting: Family Medicine

## 2020-12-16 ENCOUNTER — Ambulatory Visit (INDEPENDENT_AMBULATORY_CARE_PROVIDER_SITE_OTHER): Payer: Medicare HMO | Admitting: Family Medicine

## 2020-12-16 VITALS — BP 193/91 | HR 78 | Temp 97.6°F | Ht 69.0 in | Wt 217.0 lb

## 2020-12-16 DIAGNOSIS — Z0001 Encounter for general adult medical examination with abnormal findings: Secondary | ICD-10-CM

## 2020-12-16 DIAGNOSIS — E785 Hyperlipidemia, unspecified: Secondary | ICD-10-CM

## 2020-12-16 DIAGNOSIS — N183 Chronic kidney disease, stage 3 unspecified: Secondary | ICD-10-CM | POA: Diagnosis not present

## 2020-12-16 DIAGNOSIS — H409 Unspecified glaucoma: Secondary | ICD-10-CM | POA: Diagnosis not present

## 2020-12-16 DIAGNOSIS — E039 Hypothyroidism, unspecified: Secondary | ICD-10-CM | POA: Diagnosis not present

## 2020-12-16 DIAGNOSIS — E1169 Type 2 diabetes mellitus with other specified complication: Secondary | ICD-10-CM

## 2020-12-16 DIAGNOSIS — E1122 Type 2 diabetes mellitus with diabetic chronic kidney disease: Secondary | ICD-10-CM | POA: Diagnosis not present

## 2020-12-16 DIAGNOSIS — M1A9XX0 Chronic gout, unspecified, without tophus (tophi): Secondary | ICD-10-CM

## 2020-12-16 DIAGNOSIS — Z6832 Body mass index (BMI) 32.0-32.9, adult: Secondary | ICD-10-CM | POA: Diagnosis not present

## 2020-12-16 DIAGNOSIS — E1159 Type 2 diabetes mellitus with other circulatory complications: Secondary | ICD-10-CM | POA: Diagnosis not present

## 2020-12-16 DIAGNOSIS — I152 Hypertension secondary to endocrine disorders: Secondary | ICD-10-CM

## 2020-12-16 LAB — CBC
HCT: 44.5 % (ref 39.0–52.0)
Hemoglobin: 15 g/dL (ref 13.0–17.0)
MCHC: 33.6 g/dL (ref 30.0–36.0)
MCV: 86.8 fl (ref 78.0–100.0)
Platelets: 247 10*3/uL (ref 150.0–400.0)
RBC: 5.13 Mil/uL (ref 4.22–5.81)
RDW: 14.9 % (ref 11.5–15.5)
WBC: 7.6 10*3/uL (ref 4.0–10.5)

## 2020-12-16 LAB — LDL CHOLESTEROL, DIRECT: Direct LDL: 89 mg/dL

## 2020-12-16 LAB — LIPID PANEL
Cholesterol: 154 mg/dL (ref 0–200)
HDL: 44.6 mg/dL (ref >39.00)
NonHDL: 109.85
Total CHOL/HDL Ratio: 3
Triglycerides: 218 mg/dL — ABNORMAL HIGH (ref 0.0–149.0)
VLDL: 43.6 mg/dL — ABNORMAL HIGH (ref 0.0–40.0)

## 2020-12-16 LAB — COMPREHENSIVE METABOLIC PANEL
ALT: 17 U/L (ref 0–53)
AST: 19 U/L (ref 0–37)
Albumin: 4.6 g/dL (ref 3.5–5.2)
Alkaline Phosphatase: 60 U/L (ref 39–117)
BUN: 16 mg/dL (ref 6–23)
CO2: 32 mEq/L (ref 19–32)
Calcium: 10.2 mg/dL (ref 8.4–10.5)
Chloride: 99 mEq/L (ref 96–112)
Creatinine, Ser: 1.13 mg/dL (ref 0.40–1.50)
GFR: 64.46 mL/min (ref >60.00)
Glucose, Bld: 182 mg/dL — ABNORMAL HIGH (ref 70–99)
Potassium: 4.3 mEq/L (ref 3.5–5.1)
Sodium: 141 mEq/L (ref 135–145)
Total Bilirubin: 0.9 mg/dL (ref 0.2–1.2)
Total Protein: 8.1 g/dL (ref 6.0–8.3)

## 2020-12-16 LAB — URIC ACID: Uric Acid, Serum: 3.8 mg/dL — ABNORMAL LOW (ref 4.0–7.8)

## 2020-12-16 LAB — TSH: TSH: 1.71 u[IU]/mL (ref 0.35–5.50)

## 2020-12-16 LAB — HEMOGLOBIN A1C: Hgb A1c MFr Bld: 7.7 % — ABNORMAL HIGH (ref 4.6–6.5)

## 2020-12-16 MED ORDER — LOSARTAN POTASSIUM-HCTZ 100-25 MG PO TABS: 1.0000 | ORAL_TABLET | Freq: Every day | ORAL | 3 refills | Status: DC

## 2020-12-16 MED ORDER — ATORVASTATIN CALCIUM 10 MG PO TABS: 10.0000 mg | ORAL_TABLET | Freq: Every day | ORAL | 3 refills | Status: DC

## 2020-12-16 MED ORDER — AMLODIPINE BESYLATE 10 MG PO TABS: 10.0000 mg | ORAL_TABLET | Freq: Every day | ORAL | 3 refills | Status: DC

## 2020-12-16 MED ORDER — ALLOPURINOL 100 MG PO TABS
50.0000 mg | ORAL_TABLET | Freq: Every day | ORAL | 3 refills | Status: DC
Start: 2020-12-16 — End: 2021-11-22

## 2020-12-16 NOTE — Assessment & Plan Note (Signed)
Follows with ophthalmology

## 2020-12-16 NOTE — Assessment & Plan Note (Signed)
No recent flares.  Check uric acid level today.  Continue allopurinol 50 mg daily.

## 2020-12-16 NOTE — Assessment & Plan Note (Signed)
Discussed lifestyle modifications.  We will check A1c.  He has tolerating current regimen of metformin 500 mg in the morning, 1000 mg in the evening well.

## 2020-12-16 NOTE — Patient Instructions (Signed)
It was very nice to see you today!  We will check blood work today.  Please keep an eye on your blood pressure and let me know if it is persistently 150/90 or higher at home.  We will give your flu shot today.  Please continue working on diet and exercise.  We will see you back in 6 months.  Please come back sooner if needed.  Take care, Dr Jimmey Ralph  PLEASE NOTE:  If you had any lab tests please let us know if you have not heard back within a few days. You may see your results on mychart before we have a chance to review them but we will give you a call once they are reviewed by [place]. If we ordered any referrals today, please let us know if you have not heard from their office within the next week.   Please try these tips to maintain a healthy lifestyle:  Eat at least 3 REAL meals and 1-2 snacks per day.  Aim for no more than 5 hours between eating.  If you eat breakfast, please do so within one hour of getting up.   Each meal should contain half fruits/vegetables, one quarter protein, and one quarter carbs (no bigger than a computer mouse)  Cut down on sweet beverages. This includes juice, soda, and sweet tea.   Drink at least 1 glass of water with each meal and aim for at least 8 glasses per day  Exercise at least 150 minutes every week.    Preventive Care 75 Years and Older, Male Preventive care refers to lifestyle choices and visits with your health care provider that can promote health and wellness. Preventive care visits are also called wellness exams. What can I expect for my preventive care visit? Counseling During your preventive care visit, your health care provider may ask about your: Medical history, including: Past medical problems. Family medical history. History of falls. Current health, including: Emotional well-being. Home life and relationship well-being. Sexual activity. Memory and ability to understand (cognition). Lifestyle, including: Alcohol, nicotine  or tobacco, and drug use. Access to firearms. Diet, exercise, and sleep habits. Work and work Astronomer. Sunscreen use. Safety issues such as seatbelt and bike helmet use. Physical exam Your health care provider will check your: Height and weight. These may be used to calculate your BMI (body mass index). BMI is a measurement that tells if you are at a healthy weight. Waist circumference. This measures the distance around your waistline. This measurement also tells if you are at a healthy weight and may help predict your risk of certain diseases, such as type 2 diabetes and high blood pressure. Heart rate and blood pressure. Body temperature. Skin for abnormal spots. What immunizations do I need? Vaccines are usually given at various ages, according to a schedule. Your health care provider will recommend vaccines for you based on your age, medical history, and lifestyle or other factors, such as travel or where you work. What tests do I need? Screening Your health care provider may recommend screening tests for certain conditions. This may include: Lipid and cholesterol levels. Diabetes screening. This is done by checking your blood sugar (glucose) after you have not eaten for a while (fasting). Hepatitis C test. Hepatitis B test. HIV (human immunodeficiency virus) test. STI (sexually transmitted infection) testing, if you are at risk. Lung cancer screening. Colorectal cancer screening. Prostate cancer screening. Abdominal aortic aneurysm (AAA) screening. You may need this if you are a current or former smoker.  Talk with your health care provider about your test results, treatment options, and if necessary, the need for more tests. Follow these instructions at home: Eating and drinking  Eat a diet that includes fresh fruits and vegetables, whole grains, lean protein, and low-fat dairy products. Limit your intake of foods with high amounts of sugar, saturated fats, and salt. Take  vitamin and mineral supplements as recommended by your health care provider. Do not drink alcohol if your health care provider tells you not to drink. If you drink alcohol: Limit how much you have to 0-2 drinks a day. Know how much alcohol is in your drink. In the [place] one drink equals one 12 oz bottle of beer (355 mL), one 5 oz glass of wine (148 mL), or one 1 oz glass of hard liquor (44 mL). Lifestyle Brush your teeth every morning and night with fluoride toothpaste. Floss one time each day. Exercise for at least 30 minutes 5 or more days each week. Do not use any products that contain nicotine or tobacco. These products include cigarettes, chewing tobacco, and vaping devices, such as e-cigarettes. If you need help quitting, ask your health care provider. Do not use drugs. If you are sexually active, practice safe sex. Use a condom or other form of protection to prevent STIs. Take aspirin only as told by your health care provider. Make sure that you understand how much to take and what form to take. Work with your health care provider to find out whether it is safe and beneficial for you to take aspirin daily. Ask your health care provider if you need to take a cholesterol-lowering medicine (statin). Find healthy ways to manage stress, such as: Meditation, yoga, or listening to music. Journaling. Talking to a trusted person. Spending time with friends and family. Safety Always wear your seat belt while driving or riding in a vehicle. Do not drive: If you have been drinking alcohol. Do not ride with someone who has been drinking. When you are tired or distracted. While texting. If you have been using any mind-altering substances or drugs. Wear a helmet and other protective equipment during sports activities. If you have firearms in your house, make sure you follow all gun safety procedures. Minimize exposure to UV radiation to reduce your risk of skin cancer. What's next? Visit your  health care provider once a year for an annual wellness visit. Ask your health care provider how often you should have your eyes and teeth checked. Stay up to date on all vaccines. This information is not intended to replace advice given to you by your health care provider. Make sure you discuss any questions you have with your health care provider. Document Revised: 06/24/2020 Document Reviewed: 06/24/2020 Elsevier Patient Education  2022 [employer].

## 2020-12-16 NOTE — Assessment & Plan Note (Signed)
Elevated today though typically well controlled at home.  We will not change medications today.  He will continue monitoring at home and let me know if persistently 150/90 or higher.  Continue amlodipine 10 mg daily, losartan-HCTZ 10-25 mg once daily, metoprolol 50 mg twice daily.  Check labs today.

## 2020-12-16 NOTE — Assessment & Plan Note (Signed)
Check TSH.  Continue Synthroid 137 mcg daily. 

## 2020-12-16 NOTE — Progress Notes (Signed)
Chief Complaint:  Paul Banks is a 75 y.o. male who presents today for his annual comprehensive physical exam.    Assessment/Plan:  Chronic Problems Addressed Today: Hypertension associated with diabetes (HCC) Elevated today though typically well controlled at home.  We will not change medications today.  He will continue monitoring at home and let me know if persistently 150/90 or higher.  Continue amlodipine 10 mg daily, losartan-HCTZ 10-25 mg once daily, metoprolol 50 mg twice daily.  Check labs today.  Gout No recent flares.  Check uric acid level today.  Continue allopurinol 50 mg daily.  Dyslipidemia associated with type 2 diabetes mellitus (HCC) He is doing well on Lipitor 10 mg daily.  We will check lipids today.  Discussed lifestyle modifications.  Hypothyroidism Check TSH.  Continue Synthroid 137 mcg daily.  Diabetes (HCC) Discussed lifestyle modifications.  We will check A1c.  He has tolerating current regimen of metformin 500 mg in the morning, 1000 mg in the evening well.   Glaucoma Follows with ophthalmology.  Body mass index is 32.05 kg/m. / Obese  BMI Metric Follow Up - 12/16/20 0904       BMI Metric Follow Up-Please document annually   BMI Metric Follow Up Education provided              Preventative Healthcare: Flu shot given today.  Due for next colonoscopy next year.  We will check labs today.  Declined COVID booster.  Colonoscopy - Last completed on 07/18/2016. PSA- Last checked on 11/28/2018. Reading was 1.00  Patient Counseling(The following topics were reviewed and/or handout was given):  -Nutrition: Stressed importance of moderation in sodium/caffeine intake, saturated fat and cholesterol, caloric balance, sufficient intake of fresh fruits, vegetables, and fiber.  -Stressed the importance of regular exercise.   -Substance Abuse: Discussed cessation/primary prevention of tobacco, alcohol, or other drug use; driving or other dangerous  activities under the influence; availability of treatment for abuse.   -Injury prevention: Discussed safety belts, safety helmets, smoke detector, smoking near bedding or upholstery.   -Sexuality: Discussed sexually transmitted diseases, partner selection, use of condoms, avoidance of unintended pregnancy and contraceptive alternatives.   -Dental health: Discussed importance of regular tooth brushing, flossing, and dental visits.  -Health maintenance and immunizations reviewed. Please refer to Health maintenance section.  Return to care in 1 year for next preventative visit.     Subjective:  HPI:  He has no acute complaints today.   He has been visiting his ophthalmologist to manage his eye pressure. Had an x-ray in November. Reading was 21 mm Hg. Next appointment in February. Has a family history of glaucoma.  He reports he has been trying to lose weight and is having a hard time. He is not sure if it is due to his thyroid problems.   His blood pressure is a little high at this visit. 193/91. Might be due to "white-coat syndrome". Readings at home have been stable.  He reports he has had no recent flare-ups.  Checks his sugar levels at home. Ranges between 89-101.   He will get the flu vaccine today.  Lifestyle Diet: Eats between 1800-2200 calories daily. Exercise: Walks about 2 miles a day. Lifts free weights twice a week.  Depression screen PHQ 2/9 12/16/2020  Decreased Interest 0  Down, Depressed, Hopeless 0  PHQ - 2 Score 0  Altered sleeping -  Tired, decreased energy -  Change in appetite -  Feeling bad or failure about yourself  -  Trouble concentrating -  Moving slowly or fidgety/restless -  Suicidal thoughts -  PHQ-9 Score -    Health Maintenance Due  Topic Date Due   COVID-19 Vaccine (2 - Booster for Janssen series) 05/17/2019   FOOT EXAM  11/28/2019   INFLUENZA VACCINE  08/10/2020    ROS: Per HPI, otherwise a complete review of systems was negative.    PMH:  The following were reviewed and entered/updated in epic: Past Medical History:  Diagnosis Date   ANEMIA 07/01/2009   COUGH DUE TO ACE INHIBITORS 09/10/2008   DM 06/12/2008   FATTY LIVER DISEASE 06/12/2008   Gout, unspecified 07/01/2009   HYPERCHOLESTEROLEMIA 05/02/2007   HYPERTENSION 08/17/2006   HYPOTHYROIDISM 08/17/2006   Mild renal insufficiency    w/u NEG   NASH (nonalcoholic steatohepatitis)    Patient Active Problem List   Diagnosis Date Noted   Glaucoma 12/16/2020   Hypertension associated with diabetes (HCC) 11/08/2017   Diabetes (HCC) 03/04/2015   Hypercalcemia 10/10/2012   Renal insufficiency 10/10/2012   Gout 07/01/2009   COUGH DUE TO ACE INHIBITORS 09/10/2008   NASH (nonalcoholic steatohepatitis) 06/12/2008   Dyslipidemia associated with type 2 diabetes mellitus (HCC) 05/02/2007   Hypothyroidism 08/17/2006   History reviewed. No pertinent surgical history.  Family History  Problem Relation Age of Onset   Cancer Mother        Breast Cancer   Cancer Sister        Breast Cancer    Medications- reviewed and updated Current Outpatient Medications  Medication Sig Dispense Refill   Accu-Chek Softclix Lancets lancets USE TO CHECK BLOOD SUGAR 1 TIME PER DAY 100 each 2   allopurinol (ZYLOPRIM) 100 MG tablet Take 0.5 tablets (50 mg total) by mouth daily. 45 tablet 3   amLODipine (NORVASC) 10 MG tablet Take 1 tablet (10 mg total) by mouth daily. 90 tablet 3   aspirin 81 MG tablet Take 81 mg by mouth daily.     atorvastatin (LIPITOR) 10 MG tablet Take 1 tablet (10 mg total) by mouth daily. 90 tablet 3   Azelastine HCl 137 MCG/SPRAY SOLN SPRAY TWO SPRAYS IN EACH NOSTRIL TWICE DAILY 30 mL 12   Fish Oil-Cholecalciferol (FISH OIL + D3 PO) Take by mouth.     glucose blood (ACCU-CHEK AVIVA PLUS) test strip USE TO CHECK BLOOD SUGAR 1 TIME PER DAY 100 each 1   levothyroxine (SYNTHROID) 137 MCG tablet TAKE ONE TABLET BY MOUTH DAILY BEFORE BREAKFAST 90 tablet 3    losartan-hydrochlorothiazide (HYZAAR) 100-25 MG tablet Take 1 tablet by mouth daily. 90 tablet 3   metFORMIN (GLUCOPHAGE-XR) 500 MG 24 hr tablet Take one tablet (500 mg) in the morning with breakfast and two tablets (1000 mg) in evening with dinner 360 tablet 3   metoprolol tartrate (LOPRESSOR) 50 MG tablet TAKE ONE TABLET BY MOUTH TWICE A DAY 180 tablet 3   Multiple Vitamins-Minerals (MULTI COMPLETE PO) Take by mouth.     potassium chloride (KLOR-CON) 8 MEQ tablet TAKE ONE TABLET BY MOUTH DAILY 90 tablet 2   timolol (TIMOPTIC) 0.5 % ophthalmic solution      No current facility-administered medications for this visit.    Allergies-reviewed and updated Allergies  Allergen Reactions   Penicillins     REACTION: Rash   Pioglitazone     REACTION: edema   Tetracycline     REACTION: Rash    Social History   Socioeconomic History   Marital status: Single    Spouse name: Not on  file   Number of children: Not on file   Years of education: Not on file   Highest education level: Not on file  Occupational History   Occupation: Production manager    Comment: for [employer]  Tobacco Use   Smoking status: Never   Smokeless tobacco: Never  Substance and Sexual Activity   Alcohol use: Not on file   Drug use: Not on file   Sexual activity: Not on file  Other Topics Concern   Not on file  Social History Narrative   Not on file   Social Determinants of Health   Financial Resource Strain: Not on file  Food Insecurity: Not on file  Transportation Needs: Not on file  Physical Activity: Not on file  Stress: Not on file  Social Connections: Not on file         Objective:  Physical Exam: BP (!) 193/91 (BP Location: Left Arm)   Pulse 78   Temp 97.6 F (36.4 C) (Temporal)   Ht 5\' 9"  (1.753 m)   Wt 217 lb (98.4 kg)   SpO2 100%   BMI 32.05 kg/m   Body mass index is 32.05 kg/m. Wt Readings from Last 3 Encounters:  12/16/20 217 lb (98.4 kg)  06/24/20 217 lb 4 oz (98.5 kg)  01/08/20  210 lb (95.3 kg)  Gen: NAD, resting comfortably HEENT: TMs normal bilaterally. OP clear. No thyromegaly noted.  CV: RRR with no murmurs appreciated Pulm: NWOB, CTAB with no crackles, wheezes, or rhonchi GI: Normal bowel sounds present. Soft, Nontender, Nondistended. MSK: no edema, cyanosis, or clubbing noted Skin: warm, dry Neuro: CN2-12 grossly intact. Strength 5/5 in upper and lower extremities. Reflexes symmetric and intact bilaterally.  Psych: Normal affect and thought content       I,Zite Okoli,acting as a scribe for 01/10/20, MD.,have documented all relevant documentation on the behalf of Jacquiline Doe, MD,as directed by  Jacquiline Doe, MD while in the presence of Jacquiline Doe, MD.   I, Jacquiline Doe, MD, have reviewed all documentation for this visit. The documentation on 12/16/20 for the exam, diagnosis, procedures, and orders are all accurate and complete.  14/07/22. Katina Degree, MD 12/16/2020 9:05 AM

## 2020-12-16 NOTE — Assessment & Plan Note (Signed)
He is doing well on Lipitor 10 mg daily.  We will check lipids today.  Discussed lifestyle modifications.

## 2020-12-18 NOTE — Progress Notes (Signed)
Please inform patient of the following:  His A1c is up a little bit to 7.7 but otherwise is blood work is all STABLE. Would like for him to focus on cutting down sweets and carbs. I would like to see him back in 6 months to recheck his A1c. We can recheck everything else in a year.  Katina Degree. Jimmey Ralph, MD 12/18/2020 8:17 AM

## 2021-01-19 NOTE — Progress Notes (Signed)
Chronic Care Management Pharmacy Note  02/01/2021 Name:  Paul Banks MRN:  [id number] DOB:  [birth year]  Recommendations/Changes made from today's visit: no changes  Subjective: Paul Banks is an 76 y.o. year old male who is a primary patient of Vivi Barrack, MD.  The CCM team was consulted for assistance with disease management and care coordination needs.    Engaged with patient by telephone for follow up visit in response to provider referral for pharmacy case management and/or care coordination services.   Consent to Services:  The patient was given information about Chronic Care Management services, agreed to services, and gave verbal consent prior to initiation of services.  Please see initial visit note for detailed documentation.   Patient Care Team: Vivi Barrack, MD as PCP - General (Family Medicine) Calvert Cantor, MD as Consulting Physician (Ophthalmology) Edythe Clarity, [facility] (Pharmacist)  Objective:  Lab Results  Component Value Date   CREATININE 1.13 12/16/2020   CREATININE 1.16 12/11/2019   CREATININE 1.17 11/28/2018    Lab Results  Component Value Date   HGBA1C 7.7 (H) 12/16/2020   Last diabetic Eye exam: No results found for: HMDIABEYEEXA  Last diabetic Foot exam: No results found for: HMDIABFOOTEX      Component Value Date/Time   CHOL 154 12/16/2020 0904   TRIG 218.0 (H) 12/16/2020 0904   TRIG 400 (HH) 01/16/2006 0805   HDL 44.60 12/16/2020 0904   CHOLHDL 3 12/16/2020 0904   VLDL 43.6 (H) 12/16/2020 0904   LDLCALC 81 12/11/2019 0933   LDLDIRECT 89.0 12/16/2020 0904    Hepatic Function Latest Ref Rng & Units 12/16/2020 12/11/2019 11/28/2018  Total Protein 6.0 - 8.3 g/dL 8.1 8.5(H) 7.8  Albumin 3.5 - 5.2 g/dL 4.6 - 4.6  AST 0 - 37 U/L $Remo'19 18 20  'zbbQB$ ALT 0 - 53 U/L $Remo'17 20 17  'BfMyr$ Alk Phosphatase 39 - 117 U/L 60 - 62  Total Bilirubin 0.2 - 1.2 mg/dL 0.9 0.6 0.9  Bilirubin, Direct 0.0 - 0.3 mg/dL - - -    Lab Results  Component Value  Date/Time   TSH 1.71 12/16/2020 09:04 AM   TSH 2.29 12/11/2019 09:33 AM    CBC Latest Ref Rng & Units 12/16/2020 12/11/2019 11/28/2018  WBC 4.0 - 10.5 K/uL 7.6 9.8 9.8  Hemoglobin 13.0 - 17.0 g/dL 15.0 15.8 15.5  Hematocrit 39.0 - 52.0 % 44.5 46.9 45.3  Platelets 150.0 - 400.0 K/uL 247.0 308 283.0    Lab Results  Component Value Date/Time   VD25OH 36.76 10/18/2017 08:47 AM    Clinical ASCVD:  The 10-year ASCVD risk score (Arnett DK, et al., 2019) is: 66.7%   Values used to calculate the score:     Age: 26 years     Sex: Male     Is Non-Hispanic African American: No     Diabetic: Yes     Tobacco smoker: No     Systolic Blood Pressure: 863 mmHg     Is BP treated: Yes     HDL Cholesterol: 44.6 mg/dL     Total Cholesterol: 154 mg/dL    Other: (CHADS2VASc if Afib, PHQ9 if depression, MMRC or CAT for COPD, ACT, DEXA)  Social History   Tobacco Use  Smoking Status Never  Smokeless Tobacco Never   BP Readings from Last 3 Encounters:  12/16/20 (!) 193/91  09/27/20 121/84  06/24/20 (!) 190/83   Pulse Readings from Last 3 Encounters:  12/16/20 78  06/24/20 85  12/11/19 76   Wt Readings from Last 3 Encounters:  12/16/20 217 lb (98.4 kg)  06/24/20 217 lb 4 oz (98.5 kg)  01/08/20 210 lb (95.3 kg)    Assessment: Review of patient past medical history, allergies, medications, health status, including review of consultants reports, laboratory and other test data, was performed as part of comprehensive evaluation and provision of chronic care management services.   SDOH:  (Social Determinants of Health) assessments and interventions performed: Yes   CCM Care Plan  Allergies  Allergen Reactions   Penicillins     REACTION: Rash   Pioglitazone     REACTION: edema   Tetracycline     REACTION: Rash    Medications Reviewed Today     Reviewed by Edythe Clarity, [facility] (Pharmacist) on 02/01/21 at 1454  Med List Status: <None>   Medication Order Taking? Sig Documenting  Provider Last Dose Status Informant  Accu-Chek Softclix Lancets lancets [id number] Yes USE TO CHECK BLOOD SUGAR 1 TIME PER DAY Vivi Barrack, MD Taking Active   allopurinol (ZYLOPRIM) 100 MG tablet [id number] Yes Take 0.5 tablets (50 mg total) by mouth daily. Vivi Barrack, MD Taking Active   amLODipine (NORVASC) 10 MG tablet [id number] Yes Take 1 tablet (10 mg total) by mouth daily. Vivi Barrack, MD Taking Active   aspirin 81 MG tablet [id number] Yes Take 81 mg by mouth daily. [provider] Taking Active   atorvastatin (LIPITOR) 10 MG tablet [id number] Yes Take 1 tablet (10 mg total) by mouth daily. Vivi Barrack, MD Taking Active   Azelastine HCl 137 MCG/SPRAY SOLN [id number] Yes SPRAY TWO SPRAYS IN [facility] NOSTRIL TWICE DAILY Vivi Barrack, MD Taking Active   brimonidine [facility]) 0.2 % ophthalmic solution [id number] Yes  [provider] Taking Active   Fish Oil-Cholecalciferol (FISH OIL + D3 PO) [id number] Yes Take by mouth. [provider] Taking Active   glucose blood (ACCU-CHEK AVIVA PLUS) test strip [id number] Yes USE TO CHECK BLOOD SUGAR 1 TIME PER DAY Vivi Barrack, MD Taking Active   levothyroxine (SYNTHROID) 137 MCG tablet [id number] Yes TAKE ONE TABLET BY MOUTH DAILY BEFORE BREAKFAST Vivi Barrack, MD Taking Active   losartan-hydrochlorothiazide [facility]) 100-25 MG tablet [id number] Yes Take 1 tablet by mouth daily. Vivi Barrack, MD Taking Active   metFORMIN (GLUCOPHAGE-XR) 500 MG 24 hr tablet [id number] Yes Take one tablet (500 mg) in the morning with breakfast and two tablets (1000 mg) in evening with dinner Vivi Barrack, MD Taking Active   metoprolol tartrate (LOPRESSOR) 50 MG tablet [id number] Yes TAKE ONE TABLET BY MOUTH TWICE A DAY Vivi Barrack, MD Taking Active   Multiple Vitamins-Minerals [facility] COMPLETE PO) [id number] Yes Take by mouth. [provider] Taking Active   potassium chloride (KLOR-CON) 8 MEQ tablet [id number] Yes TAKE ONE  TABLET BY MOUTH DAILY Vivi Barrack, MD Taking Active   timolol (TIMOPTIC) 0.5 % ophthalmic solution [id number] Yes  [provider] Taking Active             Patient Active Problem List   Diagnosis Date Noted   Glaucoma 12/16/2020   Hypertension associated with diabetes ([place]) 11/08/2017   Diabetes ([place]) 03/04/2015   Hypercalcemia 10/10/2012   Renal insufficiency 10/10/2012   Gout 07/01/2009   COUGH DUE TO ACE INHIBITORS 09/10/2008   NASH (nonalcoholic steatohepatitis) 06/12/2008   Dyslipidemia associated with type 2 diabetes mellitus ([place]) 05/02/2007   Hypothyroidism 08/17/2006    Immunization History  Administered Date(s) Administered   Fluad Quad(high Dose 65+) 12/11/2019   Hepatitis A, Adult 09/24/2018   Influenza, Quadrivalent, Recombinant, Inj, Pf 12/02/2018   Influenza-Unspecified 10/10/2012   Janssen (J&J) SARS-COV-2 Vaccination 03/22/2019   Pneumococcal Conjugate-13 11/13/2013   Pneumococcal Polysaccharide-23 09/08/2010, 11/08/2017   Td 01/10/2002   Tdap 11/13/2013   Zoster Recombinat (Shingrix) 09/24/2018, 12/02/2018   Zoster, Live 03/04/2015    Conditions to be addressed/monitored: HTN HLD T2DM  Care Plan : [place]  Updates made by Edythe Clarity, RPH since 02/01/2021 12:00 AM     Problem: HTN HLD DMII Hypothyroidism   Priority: High     Long-Range Goal: Disease Management   Start Date: 06/29/2020  Expected End Date: 06/29/2021  Recent Progress: On track  Priority: High  Note:    Current Barriers:  Unable to maintain control of BP  Pharmacist Clinical Goal(s):  Patient will contact provider office for questions/concerns as evidenced notation of same in electronic health record through collaboration with PharmD and provider.   Interventions: 1:1 collaboration with Vivi Barrack, MD regarding development and update of comprehensive plan of care as evidenced by provider attestation and co-signature Inter-disciplinary  care team collaboration (see longitudinal plan of care) Comprehensive medication review performed; medication list updated in electronic medical record  Hypertension (BP goal <140/90) -Not ideally controlled -Denies sleep apnea or previous sleep study -No echo on file  -Current treatment: Metoprolol tartrate 50 mg twice daily Appropriate, Effective,  Amlodipine 10 mg daily Appropriate, Effective,  Losartan-Hydrochlorothiazide 100-25mg  once daily Appropriate, Effective -Current home readings: [id number] systolic -Denies hypotensive/hypertensive symptoms -Educated on BP goals and benefits of medications for prevention of heart attack, stroke and kidney damage; Daily salt intake goal < 2300 mg; Exercise goal of 150 minutes per week; Proper BP monitoring technique; -Counseled to monitor BP at home with 2 back to back readings, document, and provide log at future appointments -Counseled on diet and exercise extensively Recommended to continue current medication  Update 09/29/20 Recent BP readings - last BP Sunday 121/84 P 66 137/87 P 61, 140/85 P 59, 132/82 P 61 Reports that since he started new drops in his eyes he feels his BP has improved.  Denies any dizziness or HA's Continues to be active. Continue current meds for now - will continue to follow BP.  Update 02/01/21 130/82, 148/87, 139/84, 145/83, 139/90 - most recent BP at home. He is monitoring regularly and denies any symptoms at home or severely elevated BP like he has in office approx. 1 month ago. He is following up with Dr. Jerline Pain in June Encouraged him to continue to monitor - contact me if consistently > 140/90. No changes to meds at this time He continues to work on lifestyle - has lost a few pounds per his report watching portions and exercising.  Diabetes (A1c goal <7%) -Controlled -Current medications: Metformin XR 500 mg - one tablet qam and two tablets qpm -Medications previously tried: n/a  -Current home glucose  readings fasting glucose: did not obtain post prandial glucose: did not obtain -Denies hypoglycemic/hyperglycemic symptoms -Current meal patterns: watches carbs, some sweets like chocolate, grains, minimal processed foods -Current exercise: very active 2-4 miles walking multiple times per week -Recommended to continue current medication  Update 09/29/20 120, 103, 97, 99, 115, 132, 117 are recent glucose readings Congratulated him on good readings. Continues to take 1500mg  of metformin daily. Discussed diet which seems to be appropriate for maintaining euglycemia. Continue current meds for now - recheck  A1c.  Update 02/01/21 117, 93, 84, 105, 93, 101, 91 - recent home glucose readings His A1c increased to 7.7 during last OV. We discussed potential ways to bring this down. He is adherent with metformin, currently working on exercise and dietary mods. Limiting carbs/sweets.  Admits this was hard during holidays. 4-5 pound weight loss. Recommend checking A1c next OV, if still elevated could consider increase to metformin $RemoveBefo'100mg'mirSwnicEri$  BID.  Patient Goals/Self-Care Activities Patient will:  - take medications as prescribed engage in dietary modifications by minimizing sodium intake   Follow Up Plan: FU 4 months, CMA 2 months on glucose/BP  Medication Assistance: None required.  Patient affirms current coverage meets needs.             Patient's preferred pharmacy is:  [facility] [id number] - [address] [place] [address] [place] [address] Phone: [phone] Fax: [phone]   Follow Up:  Patient agrees to Care Plan and Follow-up.  Future Appointments  Date Time Provider [place]  06/16/2021 10:00 AM Vivi Barrack, MD [facility]  02/04/2022  8:45 AM [facility] HEALTH COACH [facility] [place], PharmD Clinical Pharmacist  [facility] [phone]

## 2021-01-21 ENCOUNTER — Other Ambulatory Visit: Payer: Self-pay

## 2021-01-21 ENCOUNTER — Ambulatory Visit (INDEPENDENT_AMBULATORY_CARE_PROVIDER_SITE_OTHER): Payer: Medicare HMO

## 2021-01-21 DIAGNOSIS — Z0000 Encounter for general adult medical examination without abnormal findings: Secondary | ICD-10-CM | POA: Diagnosis not present

## 2021-01-21 NOTE — Progress Notes (Signed)
Virtual Visit via Telephone Note  I connected with  Paul Princeonnie R Payette on 01/21/21 at  8:45 AM EST by telephone and verified that I am speaking with the correct person using two identifiers.  Medicare Annual Wellness visit completed telephonically due to Covid-19 pandemic.   Persons participating in this call: This Health Coach and this patient.   Location: Patient: Home Provider: Office   I discussed the limitations, risks, security and privacy concerns of performing an evaluation and management service by telephone and the availability of in person appointments. The patient expressed understanding and agreed to proceed.  Unable to perform video visit due to video visit attempted and failed and/or patient does not have video capability.   Some vital signs may be absent or patient reported.   Marzella Schleinina H Callyn Severtson, LPN   Subjective:   Paul Banks is a 76 y.o. male who presents for Medicare Annual/Subsequent preventive examination.  Review of Systems     Cardiac Risk Factors include: advanced age (>5855men, 78>65 women);dyslipidemia;male gender;hypertension;diabetes mellitus;obesity (BMI >30kg/m2)     Objective:    There were no vitals filed for this visit. There is no height or weight on file to calculate BMI.  Advanced Directives 01/21/2021 09/25/2017 07/06/2016 10/01/2014  Does Patient Have a Medical Advance Directive? Yes No No No  Type of Advance Directive Healthcare Power of Attorney - - -  Copy of Healthcare Power of Attorney in Chart? No - copy requested - - -    Current Medications (verified) Outpatient Encounter Medications as of 01/21/2021  Medication Sig   Accu-Chek Softclix Lancets lancets USE TO CHECK BLOOD SUGAR 1 TIME PER DAY   allopurinol (ZYLOPRIM) 100 MG tablet Take 0.5 tablets (50 mg total) by mouth daily.   amLODipine (NORVASC) 10 MG tablet Take 1 tablet (10 mg total) by mouth daily.   aspirin 81 MG tablet Take 81 mg by mouth daily.   atorvastatin (LIPITOR) 10 MG  tablet Take 1 tablet (10 mg total) by mouth daily.   Azelastine HCl 137 MCG/SPRAY SOLN SPRAY TWO SPRAYS IN EACH NOSTRIL TWICE DAILY   brimonidine (ALPHAGAN) 0.2 % ophthalmic solution    Fish Oil-Cholecalciferol (FISH OIL + D3 PO) Take by mouth.   glucose blood (ACCU-CHEK AVIVA PLUS) test strip USE TO CHECK BLOOD SUGAR 1 TIME PER DAY   levothyroxine (SYNTHROID) 137 MCG tablet TAKE ONE TABLET BY MOUTH DAILY BEFORE BREAKFAST   losartan-hydrochlorothiazide (HYZAAR) 100-25 MG tablet Take 1 tablet by mouth daily.   metFORMIN (GLUCOPHAGE-XR) 500 MG 24 hr tablet Take one tablet (500 mg) in the morning with breakfast and two tablets (1000 mg) in evening with dinner   metoprolol tartrate (LOPRESSOR) 50 MG tablet TAKE ONE TABLET BY MOUTH TWICE A DAY   Multiple Vitamins-Minerals (MULTI COMPLETE PO) Take by mouth.   potassium chloride (KLOR-CON) 8 MEQ tablet TAKE ONE TABLET BY MOUTH DAILY   timolol (TIMOPTIC) 0.5 % ophthalmic solution    No facility-administered encounter medications on file as of 01/21/2021.    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 No pertinent surgical history. Family History  Problem Relation  Age of Onset   Cancer Mother        Breast Cancer   Cancer Sister        Breast Cancer   Social History   Socioeconomic History   Marital status: Single    Spouse name: Not on file   Number of children: Not on file   Years of education: Not on file   Highest education level: Not on file  Occupational History   Occupation: Production managerBuyer    Comment: for [employer]  Tobacco Use   Smoking  status: Never   Smokeless tobacco: Never  Substance and Sexual Activity   Alcohol use: Not on file   Drug use: Not on file   Sexual activity: Not on file  Other Topics Concern   Not on file  Social History Narrative   Not on file   Social Determinants of Health   Financial Resource Strain: Low Risk    Difficulty of Paying Living Expenses: Not hard at all  Food Insecurity: No Food Insecurity   Worried About Programme researcher, broadcasting/film/videounning Out of Food in the Last Year: Never true   Ran Out of Food in the Last Year: Never true  Transportation Needs: No Transportation Needs   Lack of Transportation (Medical): No   Lack of Transportation (Non-Medical): No  Physical Activity: Sufficiently Active   Days of Exercise per Week: 5 days   Minutes of Exercise per Session: 30 min  Stress: No Stress Concern Present   Feeling of Stress : Not at all  Social Connections: Moderately Isolated   Frequency of Communication with Friends and Family: More than three times a week   Frequency of Social Gatherings with Friends and Family: More than three times a week   Attends Religious Services: More than 4 times per year   Active Member of [employer] Financial[employer]ubs or Organizations: No   Attends Engineer, structuralClub or Organization Meetings: Never   Marital Status: Never married    Tobacco Counseling Counseling given: Not Answered   Clinical Intake:  Pre-visit preparation completed: Yes  Pain : No/denies pain     BMI - recorded: 32.05 Nutritional Status: BMI > 30  Obese Nutritional Risks: None Diabetes: Yes CBG done?: Yes (84 once a week hadn't eaten yet) CBG resulted in Enter/ Edit results?: No Did pt. bring in CBG monitor from home?: No  How often do you need to have someone help you when you read instructions, pamphlets, or other written materials from your doctor or pharmacy?: 1 - Never  Diabetic?Nutrition Risk Assessment:  Has the patient had any N/V/D within the last 2 months?  No  Does the patient have any non-healing wounds?  No   Has the patient had any unintentional weight loss or weight gain?  No   Diabetes:  Is the patient diabetic?  Yes  If diabetic, was a CBG obtained today?  Yes  Did the patient bring in their glucometer from home?  No  How often do you monitor your CBG's? weekly.   Financial Strains and Diabetes Management:  Are you having any financial strains with the device, your supplies or your medication? No .  Does the patient want to be seen by Chronic Care Management for management of their diabetes?  No  Would the patient like to be referred to a Nutritionist or for Diabetic Management?  No   Diabetic Exams:  Diabetic Eye Exam: Completed 02/01/20 Diabetic Foot Exam: Overdue, Pt has been advised about the importance in completing this exam. Pt is scheduled  for diabetic foot exam on next appt.   Interpreter Needed?: No  Information entered by :: Lanier Ensign, LPN   Activities of Daily Living In your present state of health, do you have any difficulty performing the following activities: 01/21/2021  Hearing? N  Vision? N  Difficulty concentrating or making decisions? N  Walking or climbing stairs? N  Dressing or bathing? N  Doing errands, shopping? N  Preparing Food and eating ? N  Using the Toilet? N  In the past six months, have you accidently leaked urine? N  Do you have problems with loss of bowel control? N  Managing your Medications? N  Managing your Finances? N  Housekeeping or managing your Housekeeping? N  Some recent data might be hidden    Patient Care Team: Ardith Dark, MD as PCP - General (Family Medicine) Nelson Chimes, MD as Consulting Physician (Ophthalmology) [name], [facility] (Pharmacist)  Indicate any recent Medical Services you may have received from other than [facility] providers in the past year (date may be approximate).     Assessment:   This is a routine wellness examination for Paul Banks.  Hearing/Vision screen Hearing Screening - Comments::  Pt denies any hearing issues  Vision Screening - Comments:: Pt follows up with Dr Sherrine Maples Annie Main for annual eye exams   Dietary issues and exercise activities discussed: Current Exercise Habits: Home exercise routine, Type of exercise: walking, Time (Minutes): 30, Frequency (Times/Week): 5, Weekly Exercise (Minutes/Week): 150   Goals Addressed             This Visit's Progress    Patient Stated       Lose weight        Depression Screen PHQ 2/9 Scores 01/21/2021 12/16/2020 06/24/2020 12/11/2019 11/28/2018 11/08/2017  PHQ - 2 Score 0 0 0 0 0 0  PHQ- 9 Score - - 0 - - -    Fall Risk Fall Risk  01/21/2021 12/16/2020 06/24/2020 12/11/2019 11/28/2018  Falls in the past year? 0 0 0 0 0  Number falls in past yr: 0 0 0 - -  Injury with Fall? 0 0 - - -  Risk for fall due to : Impaired vision - - - -  Follow up Falls prevention discussed - - - -    FALL RISK PREVENTION PERTAINING TO THE HOME:  Any stairs in or around the home? Yes  If so, are there any without handrails? Yes  Home free of loose throw rugs in walkways, pet beds, electrical cords, etc? Yes  Adequate lighting in your home to reduce risk of falls? Yes    ASSISTIVE DEVICES UTILIZED TO PREVENT FALLS:  Life alert? No  Use of a cane, walker or w/c? No  Grab bars in the bathroom? Yes  Shower chair or bench in shower? Yes  Elevated toilet seat or a handicapped toilet? No   TIMED UP AND GO:  Was the test performed? No .  Cognitive Function:     6CIT Screen 01/21/2021  What Year? 0 points  What month? 0 points  What time? 0 points  Count back from 20 0 points  Months in reverse 0 points  Repeat phrase 0 points  Total Score 0    Immunizations Immunization History  Administered Date(s) Administered   Fluad Quad(high Dose 65+) 12/11/2019   Hepatitis A, Adult 09/24/2018   Influenza, Quadrivalent, Recombinant, Inj, Pf 12/02/2018   Influenza-Unspecified 10/10/2012   Janssen (J&J) SARS-COV-2 Vaccination 03/22/2019    Pneumococcal Conjugate-13  11/13/2013   Pneumococcal Polysaccharide-23 09/08/2010, 11/08/2017   Td 01/10/2002   Tdap 11/13/2013   Zoster Recombinat (Shingrix) 09/24/2018, 12/02/2018   Zoster, Live 03/04/2015    TDAP status: Up to date  Flu Vaccine status: Up to date per pt   Pneumococcal vaccine status: Up to date  Covid-19 vaccine status: Completed vaccines  Qualifies for Shingles Vaccine? Yes   Zostavax completed Yes   Shingrix Completed?: Yes  Screening Tests Health Maintenance  Topic Date Due   COVID-19 Vaccine (2 - Booster for Janssen series) 05/17/2019   Fecal DNA (Cologuard)  07/18/2019   FOOT EXAM  11/28/2019   INFLUENZA VACCINE  08/10/2020   OPHTHALMOLOGY EXAM  01/31/2021   HEMOGLOBIN A1C  06/16/2021   TETANUS/TDAP  11/14/2023   Pneumonia Vaccine 78+ Years old  Completed   Hepatitis C Screening  Completed   Zoster Vaccines- Shingrix  Completed   HPV VACCINES  Aged Out   COLONOSCOPY (Pts 45-28yrs Insurance coverage will need to be confirmed)  Discontinued    Health Maintenance  Health Maintenance Due  Topic Date Due   COVID-19 Vaccine (2 - Booster for Janssen series) 05/17/2019   Fecal DNA (Cologuard)  07/18/2019   FOOT EXAM  11/28/2019   INFLUENZA VACCINE  08/10/2020    Colorectal cancer screening: No longer required.    Additional Screening:  Hepatitis C Screening:  Completed 07/06/16  Vision Screening: Recommended annual ophthalmology exams for early detection of glaucoma and other disorders of the eye. Is the patient up to date with their annual eye exam?  Yes  Who is the provider or what is the name of the office in which the patient attends annual eye exams? Dr Glenn/Dr Hazle Quant If pt is not established with a provider, would they like to be referred to a provider to establish care? No .   Dental Screening: Recommended annual dental exams for proper oral hygiene  Community Resource Referral / Chronic Care Management: CRR required this visit?   No   CCM required this visit?  No      Plan:     I have personally reviewed and noted the following in the patients chart:   Medical and social history Use of alcohol, tobacco or illicit drugs  Current medications and supplements including opioid prescriptions. Patient is not currently taking opioid prescriptions. Functional ability and status Nutritional status Physical activity Advanced directives List of other physicians Hospitalizations, surgeries, and ER visits in previous 12 months Vitals Screenings to include cognitive, depression, and falls Referrals and appointments  In addition, I have reviewed and discussed with patient certain preventive protocols, quality metrics, and best practice recommendations. A written personalized care plan for preventive services as well as general preventive health recommendations were provided to patient.     Marzella Schlein, LPN   7/93/9030   Nurse Notes: none

## 2021-01-21 NOTE — Patient Instructions (Signed)
Paul Banks , Thank you for taking time to come for your Medicare Wellness Visit. I appreciate your ongoing commitment to your health goals. Please review the following plan we discussed and let me know if I can assist you in the future.   Screening recommendations/referrals: Colonoscopy: pt stated he will complete at physical with Dr Jimmey Ralph  Recommended yearly ophthalmology/optometry visit for glaucoma screening and checkup Recommended yearly dental visit for hygiene and checkup  Vaccinations: Influenza vaccine: pt stated completed  Pneumococcal vaccine: Up to date Tdap vaccine: Done 11/13/13 repeat every 10 years  Shingles vaccine: completed 9/14 && 12/02/18   Covid-19: Completed 03/22/19  Advanced directives: Please bring a copy of your health care power of attorney and living will to the office at your convenience.  Conditions/risks identified: lose weight   Next appointment: Follow up in one year for your annual wellness visit.   Preventive Care 38 Years and Older, Male Preventive care refers to lifestyle choices and visits with your health care provider that can promote health and wellness. What does preventive care include? A yearly physical exam. This is also called an annual well check. Dental exams once or twice a year. Routine eye exams. Ask your health care provider how often you should have your eyes checked. Personal lifestyle choices, including: Daily care of your teeth and gums. Regular physical activity. Eating a healthy diet. Avoiding tobacco and drug use. Limiting alcohol use. Practicing safe sex. Taking low doses of aspirin every day. Taking vitamin and mineral supplements as recommended by your health care provider. What happens during an annual well check? The services and screenings done by your health care provider during your annual well check will depend on your age, overall health, lifestyle risk factors, and family history of disease. Counseling  Your  health care provider may ask you questions about your: Alcohol use. Tobacco use. Drug use. Emotional well-being. Home and relationship well-being. Sexual activity. Eating habits. History of falls. Memory and ability to understand (cognition). Work and work Astronomer. Screening  You may have the following tests or measurements: Height, weight, and BMI. Blood pressure. Lipid and cholesterol levels. These may be checked every 5 years, or more frequently if you are over 73 years old. Skin check. Lung cancer screening. You may have this screening every year starting at age 35 if you have a 30-pack-year history of smoking and currently smoke or have quit within the past 15 years. Fecal occult blood test (FOBT) of the stool. You may have this test every year starting at age 62. Flexible sigmoidoscopy or colonoscopy. You may have a sigmoidoscopy every 5 years or a colonoscopy every 10 years starting at age 28. Prostate cancer screening. Recommendations will vary depending on your family history and other risks. Hepatitis C blood test. Hepatitis B blood test. Sexually transmitted disease (STD) testing. Diabetes screening. This is done by checking your blood sugar (glucose) after you have not eaten for a while (fasting). You may have this done every 1-3 years. Abdominal aortic aneurysm (AAA) screening. You may need this if you are a current or former smoker. Osteoporosis. You may be screened starting at age 51 if you are at high risk. Talk with your health care provider about your test results, treatment options, and if necessary, the need for more tests. Vaccines  Your health care provider may recommend certain vaccines, such as: Influenza vaccine. This is recommended every year. Tetanus, diphtheria, and acellular pertussis (Tdap, Td) vaccine. You may need a Td booster every  10 years. Zoster vaccine. You may need this after age 34. Pneumococcal 13-valent conjugate (PCV13) vaccine. One dose  is recommended after age 50. Pneumococcal polysaccharide (PPSV23) vaccine. One dose is recommended after age 28. Talk to your health care provider about which screenings and vaccines you need and how often you need them. This information is not intended to replace advice given to you by your health care provider. Make sure you discuss any questions you have with your health care provider. Document Released: 01/23/2015 Document Revised: 09/16/2015 Document Reviewed: 10/28/2014 Elsevier Interactive Patient Education  2017 [place] Prevention in the Home Falls can cause injuries. They can happen to people of all ages. There are many things you can do to make your home safe and to help prevent falls. What can I do on the outside of my home? Regularly fix the edges of walkways and driveways and fix any cracks. Remove anything that might make you trip as you walk through a door, such as a raised step or threshold. Trim any bushes or trees on the path to your home. Use bright outdoor lighting. Clear any walking paths of anything that might make someone trip, such as rocks or tools. Regularly check to see if handrails are loose or broken. Make sure that both sides of any steps have handrails. Any raised decks and porches should have guardrails on the edges. Have any leaves, snow, or ice cleared regularly. Use sand or salt on walking paths during winter. Clean up any spills in your garage right away. This includes oil or grease spills. What can I do in the bathroom? Use night lights. Install grab bars by the toilet and in the tub and shower. Do not use towel bars as grab bars. Use non-skid mats or decals in the tub or shower. If you need to sit down in the shower, use a plastic, non-slip stool. Keep the floor dry. Clean up any water that spills on the floor as soon as it happens. Remove soap buildup in the tub or shower regularly. Attach bath mats securely with double-sided non-slip rug  tape. Do not have throw rugs and other things on the floor that can make you trip. What can I do in the bedroom? Use night lights. Make sure that you have a light by your bed that is easy to reach. Do not use any sheets or blankets that are too big for your bed. They should not hang down onto the floor. Have a firm chair that has side arms. You can use this for support while you get dressed. Do not have throw rugs and other things on the floor that can make you trip. What can I do in the kitchen? Clean up any spills right away. Avoid walking on wet floors. Keep items that you use a lot in easy-to-reach places. If you need to reach something above you, use a strong step stool that has a grab bar. Keep electrical cords out of the way. Do not use floor polish or wax that makes floors slippery. If you must use wax, use non-skid floor wax. Do not have throw rugs and other things on the floor that can make you trip. What can I do with my stairs? Do not leave any items on the stairs. Make sure that there are handrails on both sides of the stairs and use them. Fix handrails that are broken or loose. Make sure that handrails are as long as the stairways. Check any carpeting to make  sure that it is firmly attached to the stairs. Fix any carpet that is loose or worn. Avoid having throw rugs at the top or bottom of the stairs. If you do have throw rugs, attach them to the floor with carpet tape. Make sure that you have a light switch at the top of the stairs and the bottom of the stairs. If you do not have them, ask someone to add them for you. What else can I do to help prevent falls? Wear shoes that: Do not have high heels. Have rubber bottoms. Are comfortable and fit you well. Are closed at the toe. Do not wear sandals. If you use a stepladder: Make sure that it is fully opened. Do not climb a closed stepladder. Make sure that both sides of the stepladder are locked into place. Ask someone to  hold it for you, if possible. Clearly mark and make sure that you can see: Any grab bars or handrails. First and last steps. Where the edge of each step is. Use tools that help you move around (mobility aids) if they are needed. These include: Canes. Walkers. Scooters. Crutches. Turn on the lights when you go into a dark area. Replace any light bulbs as soon as they burn out. Set up your furniture so you have a clear path. Avoid moving your furniture around. If any of your floors are uneven, fix them. If there are any pets around you, be aware of where they are. Review your medicines with your doctor. Some medicines can make you feel dizzy. This can increase your chance of falling. Ask your doctor what other things that you can do to help prevent falls. This information is not intended to replace advice given to you by your health care provider. Make sure you discuss any questions you have with your health care provider. Document Released: 10/23/2008 Document Revised: 06/04/2015 Document Reviewed: 01/31/2014 Elsevier Interactive Patient Education  2017 [employer].

## 2021-02-01 ENCOUNTER — Ambulatory Visit (INDEPENDENT_AMBULATORY_CARE_PROVIDER_SITE_OTHER): Payer: Medicare HMO | Admitting: Pharmacist

## 2021-02-01 DIAGNOSIS — I152 Hypertension secondary to endocrine disorders: Secondary | ICD-10-CM

## 2021-02-01 DIAGNOSIS — E1159 Type 2 diabetes mellitus with other circulatory complications: Secondary | ICD-10-CM

## 2021-02-01 DIAGNOSIS — N183 Chronic kidney disease, stage 3 unspecified: Secondary | ICD-10-CM

## 2021-02-01 NOTE — Patient Instructions (Addendum)
Visit Information   Goals Addressed             This Visit's Progress    Track and Manage My Blood Pressure-Hypertension   On track    Timeframe:  Long-Range Goal Priority:  High Start Date: 09/29/20                            Expected End Date: 03/29/21                      Follow Up Date 12/29/20    - check blood pressure weekly - choose a place to take my blood pressure (home, clinic or office, retail store) - write blood pressure results in a log or diary    Why is this important?   You won't feel high blood pressure, but it can still hurt your blood vessels.  High blood pressure can cause heart or kidney problems. It can also cause a stroke.  Making lifestyle changes like losing a little weight or eating less salt will help.  Checking your blood pressure at home and at different times of the day can help to control blood pressure.  If the doctor prescribes medicine remember to take it the way the doctor ordered.  Call the office if you cannot afford the medicine or if there are questions about it.     Notes:        Patient Care Plan: CCM Pharmacy Care Plan     Problem Identified: HTN HLD DMII Hypothyroidism   Priority: High     Long-Range Goal: Disease Management   Start Date: 06/29/2020  Expected End Date: 06/29/2021  Recent Progress: On track  Priority: High  Note:    Current Barriers:  Unable to maintain control of BP  Pharmacist Clinical Goal(s):  Patient will contact provider office for questions/concerns as evidenced notation of same in electronic health record through collaboration with PharmD and provider.   Interventions: 1:1 collaboration with Ardith Dark, MD regarding development and update of comprehensive plan of care as evidenced by provider attestation and co-signature Inter-disciplinary care team collaboration (see longitudinal plan of care) Comprehensive medication review performed; medication list updated in electronic medical  record  Hypertension (BP goal <140/90) -Not ideally controlled -Denies sleep apnea or previous sleep study -No echo on file  -Current treatment: Metoprolol tartrate 50 mg twice daily Appropriate, Effective,  Amlodipine 10 mg daily Appropriate, Effective,  Losartan-Hydrochlorothiazide 100-25mg  once daily Appropriate, Effective -Current home readings: 130s-140 systolic -Denies hypotensive/hypertensive symptoms -Educated on BP goals and benefits of medications for prevention of heart attack, stroke and kidney damage; Daily salt intake goal < 2300 mg; Exercise goal of 150 minutes per week; Proper BP monitoring technique; -Counseled to monitor BP at home with 2 back to back readings, document, and provide log at future appointments -Counseled on diet and exercise extensively Recommended to continue current medication  Update 09/29/20 Recent BP readings - last BP Sunday 121/84 P 66 137/87 P 61, 140/85 P 59, 132/82 P 61 Reports that since he started new drops in his eyes he feels his BP has improved.  Denies any dizziness or HA's Continues to be active. Continue current meds for now - will continue to follow BP.  Update 02/01/21 130/82, 148/87, 139/84, 145/83, 139/90 - most recent BP at home. He is monitoring regularly and denies any symptoms at home or severely elevated BP like he has in office approx. 1  month ago. He is following up with Dr. Jimmey Ralph in June Encouraged him to continue to monitor - contact me if consistently > 140/90. No changes to meds at this time He continues to work on lifestyle - has lost a few pounds per his report watching portions and exercising.  Diabetes (A1c goal <7%) -Controlled -Current medications: Metformin XR 500 mg - one tablet qam and two tablets qpm -Medications previously tried: n/a  -Current home glucose readings fasting glucose: did not obtain post prandial glucose: did not obtain -Denies hypoglycemic/hyperglycemic symptoms -Current meal  patterns: watches carbs, some sweets like chocolate, grains, minimal processed foods -Current exercise: very active 2-4 miles walking multiple times per week -Recommended to continue current medication  Update 09/29/20 120, 103, 97, 99, 115, 132, 117 are recent glucose readings Congratulated him on good readings. Continues to take 1500mg  of metformin daily. Discussed diet which seems to be appropriate for maintaining euglycemia. Continue current meds for now - recheck A1c.  Update 02/01/21 117, 93, 84, 105, 93, 101, 91 - recent home glucose readings His A1c increased to 7.7 during last OV. We discussed potential ways to bring this down. He is adherent with metformin, currently working on exercise and dietary mods. Limiting carbs/sweets.  Admits this was hard during holidays. 4-5 pound weight loss. Recommend checking A1c next OV, if still elevated could consider increase to metformin 100mg  BID.  Patient Goals/Self-Care Activities Patient will:  - take medications as prescribed engage in dietary modifications by minimizing sodium intake   Follow Up Plan: FU 4 months, CMA 2 months on glucose/BP  Medication Assistance: None required.  Patient affirms current coverage meets needs.            Patient verbalizes understanding of instructions and care plan provided today and agrees to view in MyChart. Active MyChart status confirmed with patient.   Telephone follow up appointment with pharmacy team member scheduled for: 4 months  02/03/21, [facility]  [name], PharmD Clinical Pharmacist  [facility] [phone]

## 2021-02-09 DIAGNOSIS — E1159 Type 2 diabetes mellitus with other circulatory complications: Secondary | ICD-10-CM

## 2021-02-09 DIAGNOSIS — I152 Hypertension secondary to endocrine disorders: Secondary | ICD-10-CM

## 2021-02-09 DIAGNOSIS — E1122 Type 2 diabetes mellitus with diabetic chronic kidney disease: Secondary | ICD-10-CM

## 2021-02-09 DIAGNOSIS — N183 Chronic kidney disease, stage 3 unspecified: Secondary | ICD-10-CM

## 2021-03-03 DIAGNOSIS — H401131 Primary open-angle glaucoma, bilateral, mild stage: Secondary | ICD-10-CM | POA: Diagnosis not present

## 2021-03-12 ENCOUNTER — Encounter: Payer: Self-pay | Admitting: Family Medicine

## 2021-03-17 ENCOUNTER — Telehealth: Payer: Self-pay | Admitting: Pharmacist

## 2021-03-17 NOTE — Progress Notes (Signed)
? ? ?Chronic Care Management ?Pharmacy Assistant  ? ?Name: Paul Banks  MRN: [id number] DOB: 26-Sep-1945 ? ? ?Reason for Encounter: Hypertension Adherence Call ?  ? ?Recent office visits:  ?None ? ?Recent consult visits:  ?None ? ?Hospital visits:  ?None in previous 6 months ? ?Medications: ?Outpatient Encounter Medications as of 03/17/2021  ?Medication Sig  ? Accu-Chek Softclix Lancets lancets USE TO CHECK BLOOD SUGAR 1 TIME PER DAY  ? allopurinol (ZYLOPRIM) 100 MG tablet Take 0.5 tablets (50 mg total) by mouth daily.  ? amLODipine (NORVASC) 10 MG tablet Take 1 tablet (10 mg total) by mouth daily.  ? aspirin 81 MG tablet Take 81 mg by mouth daily.  ? atorvastatin (LIPITOR) 10 MG tablet Take 1 tablet (10 mg total) by mouth daily.  ? Azelastine HCl 137 MCG/SPRAY SOLN SPRAY TWO SPRAYS IN EACH NOSTRIL TWICE DAILY  ? brimonidine (ALPHAGAN) 0.2 % ophthalmic solution   ? Fish Oil-Cholecalciferol (FISH OIL + D3 PO) Take by mouth.  ? glucose blood (ACCU-CHEK AVIVA PLUS) test strip USE TO CHECK BLOOD SUGAR 1 TIME PER DAY  ? levothyroxine (SYNTHROID) 137 MCG tablet TAKE ONE TABLET BY MOUTH DAILY BEFORE BREAKFAST  ? losartan-hydrochlorothiazide (HYZAAR) 100-25 MG tablet Take 1 tablet by mouth daily.  ? metFORMIN (GLUCOPHAGE-XR) 500 MG 24 hr tablet Take one tablet (500 mg) in the morning with breakfast and two tablets (1000 mg) in evening with dinner  ? metoprolol tartrate (LOPRESSOR) 50 MG tablet TAKE ONE TABLET BY MOUTH TWICE A DAY  ? Multiple Vitamins-Minerals (MULTI COMPLETE PO) Take by mouth.  ? potassium chloride (KLOR-CON) 8 MEQ tablet TAKE ONE TABLET BY MOUTH DAILY  ? timolol (TIMOPTIC) 0.5 % ophthalmic solution   ? ?No facility-administered encounter medications on file as of 03/17/2021.  ? ?Reviewed chart prior to disease state call. Spoke with patient regarding BP ? ?Recent Office Vitals: ?BP Readings from Last 3 Encounters:  ?12/16/20 (!) 193/91  ?09/27/20 121/84  ?06/24/20 (!) 190/83  ? ?Pulse Readings from Last 3  Encounters:  ?12/16/20 78  ?06/24/20 85  ?12/11/19 76  ?  ?Wt Readings from Last 3 Encounters:  ?12/16/20 217 lb (98.4 kg)  ?06/24/20 217 lb 4 oz (98.5 kg)  ?01/08/20 210 lb (95.3 kg)  ?  ? ?Kidney Function ?Lab Results  ?Component Value Date/Time  ? CREATININE 1.13 12/16/2020 09:04 AM  ? CREATININE 1.16 12/11/2019 09:33 AM  ? CREATININE 1.17 11/28/2018 08:55 AM  ? GFR 64.46 12/16/2020 09:04 AM  ? GFRNONAA 53.25 06/29/2009 08:48 AM  ? GFRAA 62 04/27/2007 07:47 AM  ? ? ?BMP Latest Ref Rng & Units 12/16/2020 12/11/2019 11/28/2018  ?Glucose 70 - 99 mg/dL 194(R) 740(C) 144(Y)  ?BUN 6 - 23 mg/dL 16 20 19   ?Creatinine 0.40 - 1.50 mg/dL 1.85 6.31  ?BUN/Creat Ratio 6 - 22 (calc) - NOT APPLICABLE -  ?Sodium 135 - 145 mEq/L 141 137 138  ?Potassium 3.5 - 5.1 mEq/L 4.3 4.3 4.2  ?Chloride 96 - 112 mEq/L 99 98 96  ?CO2 19 - 32 mEq/L 32 30 30  ?Calcium 8.4 - 10.5 mg/dL 4.97 10.6(H) 10.0  ? ? ?Current antihypertensive regimen:  ?Metoprolol Tartrate 50 mg twice daily ?Amlodipine 10 mg daily ?Losartan-Hydrochlorothiazide 100-25 mg daily ? ?How often are you checking your Blood Pressure? 1-2x per week ? ?Current home BP readings: 127/81 ? ?What recent interventions/DTPs have been made by any provider to improve Blood Pressure control since last CPP Visit: No recent interventions or DTPs. ? ?  Any recent hospitalizations or ED visits since last visit with CPP? No ? ?What diet changes have been made to improve Blood Pressure Control?  ?Patient states he tries to limit 150 carbs per day. ? ?What exercise is being done to improve your Blood Pressure Control?  ?Patient states he likes to walk 2-3 miles per day. ? ?Adherence Review: ?Is the patient currently on ACE/ARB medication? Yes ?Does the patient have >5 day gap between last estimated fill dates? No ? ? ?Care Gaps: ?Medicare Annual Wellness: Completed 01/21/2021 ?Ophthalmology Exam: Overdue since 01/31/2021 ?Foot Exam: Overdue since 11/28/2019 ?Hemoglobin A1C: 7.7% on  12/16/2020 ?Colonoscopy: Discontinued, last completed 06/01/2007 ? ?Future Appointments  ?Date Time Provider Department Center  ?06/16/2021 10:00 AM Ardith Dark, MD [facility]  ?08/03/2021  3:00 PM [facility] CCM PHARMACIST [facility]  ?02/04/2022  8:45 AM [facility] HEALTH COACH [facility]  ? ?Star Rating Drugs: ?Atorvastatin 10 mg last filled 03/13/2021 90 DS ?Losartan-HCTZ 100-25 mg last filled 03/13/2021 90 DS ?Metformin 500 mg last filled 01/12/2021 90 DS ? ?April D Calhoun, CMA ?Clinical Pharmacist Assistant ?[phone]  ?

## 2021-03-22 DIAGNOSIS — Z1211 Encounter for screening for malignant neoplasm of colon: Secondary | ICD-10-CM | POA: Diagnosis not present

## 2021-03-30 LAB — COLOGUARD: COLOGUARD: NEGATIVE

## 2021-03-31 NOTE — Progress Notes (Signed)
Please inform patient of the following: ? ?Good news! Cologuard is negative. We can recheck again in 3 years. ? ?Katina Degree. Jimmey Ralph, MD ?03/31/2021 8:04 AM  ?

## 2021-04-20 ENCOUNTER — Other Ambulatory Visit: Payer: Self-pay | Admitting: *Deleted

## 2021-06-08 ENCOUNTER — Other Ambulatory Visit: Payer: Self-pay | Admitting: Family Medicine

## 2021-06-09 DIAGNOSIS — H25813 Combined forms of age-related cataract, bilateral: Secondary | ICD-10-CM | POA: Diagnosis not present

## 2021-06-09 DIAGNOSIS — E119 Type 2 diabetes mellitus without complications: Secondary | ICD-10-CM | POA: Diagnosis not present

## 2021-06-09 DIAGNOSIS — H401131 Primary open-angle glaucoma, bilateral, mild stage: Secondary | ICD-10-CM | POA: Diagnosis not present

## 2021-06-09 DIAGNOSIS — H04123 Dry eye syndrome of bilateral lacrimal glands: Secondary | ICD-10-CM | POA: Diagnosis not present

## 2021-06-09 LAB — HM DIABETES EYE EXAM

## 2021-06-16 ENCOUNTER — Ambulatory Visit: Payer: Medicare HMO | Admitting: Family Medicine

## 2021-07-07 ENCOUNTER — Ambulatory Visit: Payer: Medicare HMO | Admitting: Family Medicine

## 2021-07-14 ENCOUNTER — Other Ambulatory Visit: Payer: Self-pay | Admitting: Family Medicine

## 2021-07-19 ENCOUNTER — Ambulatory Visit (INDEPENDENT_AMBULATORY_CARE_PROVIDER_SITE_OTHER): Payer: Medicare HMO | Admitting: Family Medicine

## 2021-07-19 ENCOUNTER — Encounter: Payer: Self-pay | Admitting: Family Medicine

## 2021-07-19 VITALS — BP 150/90 | HR 72 | Temp 98.2°F | Ht 69.0 in | Wt 213.6 lb

## 2021-07-19 DIAGNOSIS — I152 Hypertension secondary to endocrine disorders: Secondary | ICD-10-CM | POA: Diagnosis not present

## 2021-07-19 DIAGNOSIS — N183 Chronic kidney disease, stage 3 unspecified: Secondary | ICD-10-CM | POA: Diagnosis not present

## 2021-07-19 DIAGNOSIS — E1122 Type 2 diabetes mellitus with diabetic chronic kidney disease: Secondary | ICD-10-CM

## 2021-07-19 DIAGNOSIS — E785 Hyperlipidemia, unspecified: Secondary | ICD-10-CM

## 2021-07-19 DIAGNOSIS — E1159 Type 2 diabetes mellitus with other circulatory complications: Secondary | ICD-10-CM

## 2021-07-19 DIAGNOSIS — E1169 Type 2 diabetes mellitus with other specified complication: Secondary | ICD-10-CM | POA: Diagnosis not present

## 2021-07-19 LAB — POCT GLYCOSYLATED HEMOGLOBIN (HGB A1C): Hemoglobin A1C: 7.2 % — AB (ref 4.0–5.6)

## 2021-07-19 NOTE — Assessment & Plan Note (Signed)
Stable on Lipitor 10 mg daily.  Check lipids when he comes back in for CPE in 3 months.

## 2021-07-19 NOTE — Progress Notes (Signed)
   Paul Banks is a 76 y.o. male who presents today for an office visit.  Assessment/Plan:  bChronic Problems Addressed Today: Diabetes (HCC) A1c improved to 7.2. He is working on lifestyle modifications. We will continue metformin 500mg  in the morning and 1000mg  in the evening.   Dyslipidemia associated with type 2 diabetes mellitus (HCC) Stable on Lipitor 10 mg daily.  Check lipids when he comes back in for CPE in 3 months.  Hypertension associated with diabetes (HCC) Elevated today though he is typically well controlled at home.  Likely has whitecoat hypertension.  He will continue to monitor at home and let me know if it is persistently 140/90 or higher.  We will continue current regimen amlodipine 10 mg daily, losartan-HCTZ 100-25 once daily, Metroprolol tartrate 50 mg twice daily.     Subjective:  HPI:  See A/p for status of chronic conditions.         Objective:  Physical Exam: BP (!) 150/90   Pulse 72   Temp 98.2 F (36.8 C) (Temporal)   Ht 5\' 9"  (1.753 m)   Wt 213 lb 9.6 oz (96.9 kg)   SpO2 100%   BMI 31.54 kg/m   Wt Readings from Last 3 Encounters:  07/19/21 213 lb 9.6 oz (96.9 kg)  12/16/20 217 lb (98.4 kg)  06/24/20 217 lb 4 oz (98.5 kg)  Gen: No acute distress, resting comfortably CV: Regular rate and rhythm with no murmurs appreciated Pulm: Normal work of breathing, clear to auscultation bilaterally with no crackles, wheezes, or rhonchi Neuro: Grossly normal, moves all extremities Psych: Normal affect and thought content      Tunisha Ruland M. 09/19/21, MD 07/19/2021 9:25 AM

## 2021-07-19 NOTE — Patient Instructions (Signed)
It was very nice to see you today!  Your A1c is 7.2.  Please keep up the good work with your diet and exercise.  We will see back in 6 months for your annual physical.  Come back sooner if needed.  Take care, Dr Jimmey Ralph  PLEASE NOTE:  If you had any lab tests please let us know if you have not heard back within a few days. You may see your results on mychart before we have a chance to review them but we will give you a call once they are reviewed by [place]. If we ordered any referrals today, please let us know if you have not heard from their office within the next week.   Please try these tips to maintain a healthy lifestyle:  Eat at least 3 REAL meals and 1-2 snacks per day.  Aim for no more than 5 hours between eating.  If you eat breakfast, please do so within one hour of getting up.   Each meal should contain half fruits/vegetables, one quarter protein, and one quarter carbs (no bigger than a computer mouse)  Cut down on sweet beverages. This includes juice, soda, and sweet tea.   Drink at least 1 glass of water with each meal and aim for at least 8 glasses per day  Exercise at least 150 minutes every week.

## 2021-07-19 NOTE — Assessment & Plan Note (Signed)
A1c improved to 7.2. He is working on lifestyle modifications. We will continue metformin 500mg  in the morning and 1000mg  in the evening.

## 2021-07-19 NOTE — Assessment & Plan Note (Signed)
Elevated today though he is typically well controlled at home.  Likely has whitecoat hypertension.  He will continue to monitor at home and let me know if it is persistently 140/90 or higher.  We will continue current regimen amlodipine 10 mg daily, losartan-HCTZ 100-25 once daily, Metroprolol tartrate 50 mg twice daily.

## 2021-07-20 NOTE — Progress Notes (Unsigned)
Chronic Care Management Pharmacy Note Summary: FU visit with PharmD.  A1c elevated but improving.  He is working on lifestyle mods to bring down closer to goal.  Recommendations: Routine A1c screens, consider titration of metformin if needed Due for diabetic foot exam  FU: 6 months  08/04/2021 Name:  Paul Banks MRN:  [id number] DOB:  [birth year]  Subjective: Paul Banks is an 76 y.o. year old male who is a primary patient of Vivi Barrack, MD.  The CCM team was consulted for assistance with disease management and care coordination needs.    Engaged with patient by telephone for follow up visit in response to provider referral for pharmacy case management and/or care coordination services.   Consent to Services:  The patient was given information about Chronic Care Management services, agreed to services, and gave verbal consent prior to initiation of services.  Please see initial visit note for detailed documentation.   Patient Care Team: Vivi Barrack, MD as PCP - General (Family Medicine) Calvert Cantor, MD as Consulting Physician (Ophthalmology) Edythe Clarity, [facility] (Pharmacist)  Objective:  Lab Results  Component Value Date   CREATININE 1.13 12/16/2020   CREATININE 1.16 12/11/2019   CREATININE 1.17 11/28/2018    Lab Results  Component Value Date   HGBA1C 7.2 (A) 07/19/2021   Last diabetic Eye exam:  Lab Results  Component Value Date/Time   HMDIABEYEEXA Retinopathy (A) 06/09/2021 02:50 PM    Last diabetic Foot exam: No results found for: "HMDIABFOOTEX"      Component Value Date/Time   CHOL 154 12/16/2020 0904   TRIG 218.0 (H) 12/16/2020 0904   TRIG 400 (HH) 01/16/2006 0805   HDL 44.60 12/16/2020 0904   CHOLHDL 3 12/16/2020 0904   VLDL 43.6 (H) 12/16/2020 0904   LDLCALC 81 12/11/2019 0933   LDLDIRECT 89.0 12/16/2020 0904       Latest Ref Rng & Units 12/16/2020    9:04 AM 12/11/2019    9:33 AM 11/28/2018    8:55 AM  Hepatic Function   Total Protein 6.0 - 8.3 g/dL 8.1  8.5  7.8   Albumin 3.5 - 5.2 g/dL 4.6   4.6   AST 0 - 37 U/L $Remo'19  18  20   'bzCbE$ ALT 0 - 53 U/L $Remo'17  20  17   'PZoCb$ Alk Phosphatase 39 - 117 U/L 60   62   Total Bilirubin 0.2 - 1.2 mg/dL 0.9  0.6  0.9     Lab Results  Component Value Date/Time   TSH 1.71 12/16/2020 09:04 AM   TSH 2.29 12/11/2019 09:33 AM       Latest Ref Rng & Units 12/16/2020    9:04 AM 12/11/2019    9:33 AM 11/28/2018    8:55 AM  CBC  WBC 4.0 - 10.5 K/uL 7.6  9.8  9.8   Hemoglobin 13.0 - 17.0 g/dL 15.0  15.8  15.5   Hematocrit 39.0 - 52.0 % 44.5  46.9  45.3   Platelets 150.0 - 400.0 K/uL 247.0  308  283.0     Lab Results  Component Value Date/Time   VD25OH 36.76 10/18/2017 08:47 AM    Clinical ASCVD:  The 10-year ASCVD risk score (Arnett DK, et al., 2019) is: 42.9%   Values used to calculate the score:     Age: 89 years     Sex: Male     Is Non-Hispanic African American: No     Diabetic: Yes  Tobacco smoker: No     Systolic Blood Pressure: 641 mmHg     Is BP treated: Yes     HDL Cholesterol: 44.6 mg/dL     Total Cholesterol: 154 mg/dL    Other: (CHADS2VASc if Afib, PHQ9 if depression, MMRC or CAT for COPD, ACT, DEXA)  Social History   Tobacco Use  Smoking Status Never  Smokeless Tobacco Never   BP Readings from Last 3 Encounters:  08/01/21 127/68  07/19/21 (!) 150/90  12/16/20 (!) 193/91   Pulse Readings from Last 3 Encounters:  07/19/21 72  12/16/20 78  06/24/20 85   Wt Readings from Last 3 Encounters:  07/19/21 213 lb 9.6 oz (96.9 kg)  12/16/20 217 lb (98.4 kg)  06/24/20 217 lb 4 oz (98.5 kg)    Assessment: Review of patient past medical history, allergies, medications, health status, including review of consultants reports, laboratory and other test data, was performed as part of comprehensive evaluation and provision of chronic care management services.   SDOH:  (Social Determinants of Health) assessments and interventions performed: NO, assessed this  year  Emergency planning/management officer Strain: Low Risk  (01/21/2021)   Overall Financial Resource Strain (CARDIA)    Difficulty of Paying Living Expenses: Not hard at all     CCM Care Plan  Allergies  Allergen Reactions   Penicillins     REACTION: Rash   Pioglitazone     REACTION: edema   Tetracycline     REACTION: Rash    Medications Reviewed Today     Reviewed by Edythe Clarity, [place] (Pharmacist) on 08/04/21 at 1131  Med List Status: <None>   Medication Order Taking? Sig Documenting Provider Last Dose Status Informant  Accu-Chek Softclix Lancets lancets [id number] Yes USE TO CHECK BLOOD SUGAR 1 TIME PER DAY Vivi Barrack, MD Taking Active   allopurinol (ZYLOPRIM) 100 MG tablet [id number] Yes Take 0.5 tablets (50 mg total) by mouth daily. Vivi Barrack, MD Taking Active   amLODipine (NORVASC) 10 MG tablet [id number] Yes Take 1 tablet (10 mg total) by mouth daily. Vivi Barrack, MD Taking Active   aspirin 81 MG tablet [id number] Yes Take 81 mg by mouth daily. [provider] Taking Active   atorvastatin (LIPITOR) 10 MG tablet [id number] Yes Take 1 tablet (10 mg total) by mouth daily. Vivi Barrack, MD Taking Active   Azelastine HCl 137 MCG/SPRAY SOLN [id number] Yes SPRAY TWO SPRAYS IN [facility] NOSTRIL TWICE DAILY Vivi Barrack, MD Taking Active   Fish Oil-Cholecalciferol (FISH OIL + D3 PO) [id number] Yes Take by mouth. [provider] Taking Active   glucose blood (ACCU-CHEK AVIVA PLUS) test strip [id number] Yes USE TO CHECK BLOOD SUGAR 1 TIME PER DAY Vivi Barrack, MD Taking Active   latanoprost (XALATAN) 0.005 % ophthalmic solution [id number] Yes SMARTSIG:In Eye(s) [provider] Taking Active   levothyroxine (SYNTHROID) 137 MCG tablet [id number] Yes TAKE ONE TABLET BY MOUTH DAILY BEFORE BREAKFAST Vivi Barrack, MD Taking Active   losartan-hydrochlorothiazide [facility]) 100-25 MG tablet [id number] Yes Take 1 tablet by mouth daily. Vivi Barrack, MD Taking Active    metFORMIN (GLUCOPHAGE-XR) 500 MG 24 hr tablet [id number] Yes TAKE ONE TABLET BY MOUTH EVERY MORNING WITH BREAKFAST AND TAKE TWO TABLETS BY MOUTH EVERY EVENING WITH DINNER Vivi Barrack, MD Taking Active   metoprolol tartrate (LOPRESSOR) 50 MG tablet [id number] Yes TAKE ONE TABLET BY MOUTH TWICE A DAY Vivi Barrack, MD Taking Active   Multiple Vitamins-Minerals (  MULTI COMPLETE PO) [id number] Yes Take by mouth. [provider] Taking Active   potassium chloride (KLOR-CON) 8 MEQ tablet [id number] Yes TAKE ONE TABLET BY MOUTH DAILY Vivi Barrack, MD Taking Active   timolol (TIMOPTIC) 0.5 % ophthalmic solution [id number] Yes  [provider] Taking Active             Patient Active Problem List   Diagnosis Date Noted   Glaucoma 12/16/2020   Hypertension associated with diabetes ([place]) 11/08/2017   Diabetes ([place]) 03/04/2015   Hypercalcemia 10/10/2012   Renal insufficiency 10/10/2012   Gout 07/01/2009   COUGH DUE TO ACE INHIBITORS 09/10/2008   NASH (nonalcoholic steatohepatitis) 06/12/2008   Dyslipidemia associated with type 2 diabetes mellitus ([place]) 05/02/2007   Hypothyroidism 08/17/2006    Immunization History  Administered Date(s) Administered   Fluad Quad(high Dose 65+) 12/11/2019   Hepatitis A, Adult 09/24/2018   Influenza, Quadrivalent, Recombinant, Inj, Pf 12/02/2018   Influenza-Unspecified 10/10/2012   Janssen (J&J) SARS-COV-2 Vaccination 03/22/2019   Pneumococcal Conjugate-13 11/13/2013   Pneumococcal Polysaccharide-23 09/08/2010, 11/08/2017   Td 01/10/2002   Tdap 11/13/2013   Zoster Recombinat (Shingrix) 09/24/2018, 12/02/2018   Zoster, Live 03/04/2015    Conditions to be addressed/monitored: HTN HLD T2DM  Care Plan : [place]  Updates made by Edythe Clarity, RPH since 08/04/2021 12:00 AM     Problem: HTN HLD DMII Hypothyroidism   Priority: High     Long-Range Goal: Disease Management   Start Date: 06/29/2020  Expected End  Date: 06/29/2021  Recent Progress: On track  Priority: High  Note:    Current Barriers:  Unable to maintain control of BP  Pharmacist Clinical Goal(s):  Patient will contact provider office for questions/concerns as evidenced notation of same in electronic health record through collaboration with PharmD and provider.   Interventions: 1:1 collaboration with Vivi Barrack, MD regarding development and update of comprehensive plan of care as evidenced by provider attestation and co-signature Inter-disciplinary care team collaboration (see longitudinal plan of care) Comprehensive medication review performed; medication list updated in electronic medical record  Hypertension (BP goal <140/90) 08/03/21 -Controlled based on home readings provided.  History of elevated office readings and normal home readings. -No echo on file  -Current treatment: Metoprolol tartrate 50 mg twice daily Appropriate, Effective, Safe, Accessible Amlodipine 10 mg daily Appropriate, Effective, Safe, Accessible Losartan-Hydrochlorothiazide 100-25mg  once daily Appropriate, Effective, Safe, Accessible -Current home readings: 127/78 68 P on Sunday -Denies hypotensive/hypertensive symptoms -Educated on BP goals and benefits of medications for prevention of heart attack, stroke and kidney damage; Daily salt intake goal < 2300 mg; Exercise goal of 150 minutes per week; Proper BP monitoring technique; -Counseled on diet and exercise extensively Recommended to continue current medication, could bring home monitor in next time to compare to office monitor for accuracy.  Update 09/29/20 Recent BP readings - last BP Sunday 121/84 P 66 137/87 P 61, 140/85 P 59, 132/82 P 61 Reports that since he started new drops in his eyes he feels his BP has improved.  Denies any dizziness or HA's Continues to be active. Continue current meds for now - will continue to follow BP.  Update 02/01/21 130/82, 148/87, 139/84, 145/83,  139/90 - most recent BP at home. He is monitoring regularly and denies any symptoms at home or severely elevated BP like he has in office approx. 1 month ago. He is following up with Dr. Jerline Pain in June Encouraged him to continue to monitor - contact  me if consistently > 140/90. No changes to meds at this time He continues to work on lifestyle - has lost a few pounds per his report watching portions and exercising.  Diabetes (A1c goal <7%) 08/03/21 -Not controlled, most recent A1c of 7.2 -Current medications: Metformin XR 500 mg - one tablet qam and two tablets qpm Appropriate, Effective, Safe, Accessible -Medications previously tried: n/a  -Current home glucose readings fasting glucose: 100-120 post prandial glucose: did not obtain -Denies hypoglycemic/hyperglycemic symptoms -Current meal patterns: watches carbs, some sweets like chocolate, grains, minimal processed foods -Current exercise: very active 2-4 miles walking multiple times per week -Recommended to continue current medication -He continues to work on lifestyle.  He has noticed fasting sugars improving.  Recommend continuing same meds, if need be at next A1c we could increase to maximum dose metformin of $RemoveBefo'1000mg'JrwPBXpcNIb$  twice daily.  Update 09/29/20 120, 103, 97, 99, 115, 132, 117 are recent glucose readings Congratulated him on good readings. Continues to take $Remov'1500mg'cSREnU$  of metformin daily. Discussed diet which seems to be appropriate for maintaining euglycemia. Continue current meds for now - recheck A1c.  Update 02/01/21 117, 93, 84, 105, 93, 101, 91 - recent home glucose readings His A1c increased to 7.7 during last OV. We discussed potential ways to bring this down. He is adherent with metformin, currently working on exercise and dietary mods. Limiting carbs/sweets.  Admits this was hard during holidays. 4-5 pound weight loss. Recommend checking A1c next OV, if still elevated could consider increase to metformin $RemoveBefo'100mg'ryGHRGyiFcb$   BID.  Patient Goals/Self-Care Activities Patient will:  - take medications as prescribed engage in dietary modifications by minimizing sodium intake   Follow Up Plan: FU 6 months  Medication Assistance: None required.  Patient affirms current coverage meets needs.         Compliance/Adherence/Medication fill history: Care Gaps: Foot exam - next PCP visit  Star-Rating Drugs: Metformin $RemoveBeforeDE'500mg'qHSOHYokNkxaAoy$  ER 07/14/21 90ds Atorvastatin $RemoveBeforeDEI'10mg'aKQvjiDLzxLDHIdQ$  06/10/21 90ds  Patient's preferred pharmacy is:  [facility] [id number] - [place] [address] [place] [place] [address] Phone: [phone] Fax: [phone]   Follow Up:  Patient agrees to Care Plan and Follow-up.  Future Appointments  Date Time Provider [place]  01/17/2022  8:20 AM Vivi Barrack, MD [facility]  02/04/2022  8:45 AM [facility] HEALTH COACH [facility] [place], PharmD Clinical Pharmacist  [facility] [phone]

## 2021-07-28 ENCOUNTER — Encounter: Payer: Self-pay | Admitting: Family Medicine

## 2021-08-03 ENCOUNTER — Ambulatory Visit: Payer: Medicare HMO | Admitting: Pharmacist

## 2021-08-03 VITALS — BP 127/68

## 2021-08-03 DIAGNOSIS — E1159 Type 2 diabetes mellitus with other circulatory complications: Secondary | ICD-10-CM

## 2021-08-03 DIAGNOSIS — E1122 Type 2 diabetes mellitus with diabetic chronic kidney disease: Secondary | ICD-10-CM

## 2021-08-04 NOTE — Patient Instructions (Addendum)
Visit Information   Goals Addressed             This Visit's Progress    Track and Manage My Blood Pressure-Hypertension   On track    Timeframe:  Long-Range Goal Priority:  High Start Date: 09/29/20                            Expected End Date: 03/29/21                      Follow Up Date 12/29/20    - check blood pressure weekly - choose a place to take my blood pressure (home, clinic or office, retail store) - write blood pressure results in a log or diary    Why is this important?   You won't feel high blood pressure, but it can still hurt your blood vessels.  High blood pressure can cause heart or kidney problems. It can also cause a stroke.  Making lifestyle changes like losing a little weight or eating less salt will help.  Checking your blood pressure at home and at different times of the day can help to control blood pressure.  If the doctor prescribes medicine remember to take it the way the doctor ordered.  Call the office if you cannot afford the medicine or if there are questions about it.     Notes:        Patient Care Plan: CCM Pharmacy Care Plan     Problem Identified: HTN HLD DMII Hypothyroidism   Priority: High     Long-Range Goal: Disease Management   Start Date: 06/29/2020  Expected End Date: 06/29/2021  Recent Progress: On track  Priority: High  Note:    Current Barriers:  Unable to maintain control of BP  Pharmacist Clinical Goal(s):  Patient will contact provider office for questions/concerns as evidenced notation of same in electronic health record through collaboration with PharmD and provider.   Interventions: 1:1 collaboration with Ardith Dark, MD regarding development and update of comprehensive plan of care as evidenced by provider attestation and co-signature Inter-disciplinary care team collaboration (see longitudinal plan of care) Comprehensive medication review performed; medication list updated in electronic medical  record  Hypertension (BP goal <140/90) 08/03/21 -Controlled based on home readings provided.  History of elevated office readings and normal home readings. -No echo on file  -Current treatment: Metoprolol tartrate 50 mg twice daily Appropriate, Effective, Safe, Accessible Amlodipine 10 mg daily Appropriate, Effective, Safe, Accessible Losartan-Hydrochlorothiazide 100-25mg  once daily Appropriate, Effective, Safe, Accessible -Current home readings: 127/78 68 P on Sunday -Denies hypotensive/hypertensive symptoms -Educated on BP goals and benefits of medications for prevention of heart attack, stroke and kidney damage; Daily salt intake goal < 2300 mg; Exercise goal of 150 minutes per week; Proper BP monitoring technique; -Counseled on diet and exercise extensively Recommended to continue current medication, could bring home monitor in next time to compare to office monitor for accuracy.  Update 09/29/20 Recent BP readings - last BP Sunday 121/84 P 66 137/87 P 61, 140/85 P 59, 132/82 P 61 Reports that since he started new drops in his eyes he feels his BP has improved.  Denies any dizziness or HA's Continues to be active. Continue current meds for now - will continue to follow BP.  Update 02/01/21 130/82, 148/87, 139/84, 145/83, 139/90 - most recent BP at home. He is monitoring regularly and denies any symptoms at home or severely  elevated BP like he has in office approx. 1 month ago. He is following up with Dr. Jimmey Ralph in June Encouraged him to continue to monitor - contact me if consistently > 140/90. No changes to meds at this time He continues to work on lifestyle - has lost a few pounds per his report watching portions and exercising.  Diabetes (A1c goal <7%) 08/03/21 -Not controlled, most recent A1c of 7.2 -Current medications: Metformin XR 500 mg - one tablet qam and two tablets qpm Appropriate, Effective, Safe, Accessible -Medications previously tried: n/a  -Current home  glucose readings fasting glucose: 100-120 post prandial glucose: did not obtain -Denies hypoglycemic/hyperglycemic symptoms -Current meal patterns: watches carbs, some sweets like chocolate, grains, minimal processed foods -Current exercise: very active 2-4 miles walking multiple times per week -Recommended to continue current medication -He continues to work on lifestyle.  He has noticed fasting sugars improving.  Recommend continuing same meds, if need be at next A1c we could increase to maximum dose metformin of 1000mg  twice daily.  Update 09/29/20 120, 103, 97, 99, 115, 132, 117 are recent glucose readings Congratulated him on good readings. Continues to take 1500mg  of metformin daily. Discussed diet which seems to be appropriate for maintaining euglycemia. Continue current meds for now - recheck A1c.  Update 02/01/21 117, 93, 84, 105, 93, 101, 91 - recent home glucose readings His A1c increased to 7.7 during last OV. We discussed potential ways to bring this down. He is adherent with metformin, currently working on exercise and dietary mods. Limiting carbs/sweets.  Admits this was hard during holidays. 4-5 pound weight loss. Recommend checking A1c next OV, if still elevated could consider increase to metformin 100mg  BID.  Patient Goals/Self-Care Activities Patient will:  - take medications as prescribed engage in dietary modifications by minimizing sodium intake   Follow Up Plan: FU 6 months  Medication Assistance: None required.  Patient affirms current coverage meets needs.         The patient verbalized understanding of instructions, educational materials, and care plan provided today and DECLINED offer to receive copy of patient instructions, educational materials, and care plan.  Telephone follow up appointment with pharmacy team member scheduled for: 6 months  , [facility]  , PharmD Clinical Pharmacist  [facility] [phone]

## 2021-10-04 ENCOUNTER — Encounter: Payer: Self-pay | Admitting: *Deleted

## 2021-10-25 ENCOUNTER — Telehealth: Payer: Self-pay | Admitting: Pharmacist

## 2021-10-25 NOTE — Progress Notes (Signed)
Chronic Care Management Pharmacy Assistant   Name: Paul Banks  MRN: [id number] DOB: November 17, 1945   Reason for Encounter: Diabetes Adherence Call    Recent office visits:  None  Recent consult visits:  None  Hospital visits:  None in previous 6 months  Medications: Outpatient Encounter Medications as of 10/25/2021  Medication Sig   Accu-Chek Softclix Lancets lancets USE TO CHECK BLOOD SUGAR 1 TIME PER DAY   allopurinol (ZYLOPRIM) 100 MG tablet Take 0.5 tablets (50 mg total) by mouth daily.   amLODipine (NORVASC) 10 MG tablet Take 1 tablet (10 mg total) by mouth daily.   aspirin 81 MG tablet Take 81 mg by mouth daily.   atorvastatin (LIPITOR) 10 MG tablet Take 1 tablet (10 mg total) by mouth daily.   Azelastine HCl 137 MCG/SPRAY SOLN SPRAY TWO SPRAYS IN EACH NOSTRIL TWICE DAILY   Fish Oil-Cholecalciferol (FISH OIL + D3 PO) Take by mouth.   glucose blood (ACCU-CHEK AVIVA PLUS) test strip USE TO CHECK BLOOD SUGAR 1 TIME PER DAY   latanoprost (XALATAN) 0.005 % ophthalmic solution SMARTSIG:In Eye(s)   levothyroxine (SYNTHROID) 137 MCG tablet TAKE ONE TABLET BY MOUTH DAILY BEFORE BREAKFAST   losartan-hydrochlorothiazide (HYZAAR) 100-25 MG tablet Take 1 tablet by mouth daily.   metFORMIN (GLUCOPHAGE-XR) 500 MG 24 hr tablet TAKE ONE TABLET BY MOUTH EVERY MORNING WITH BREAKFAST AND TAKE TWO TABLETS BY MOUTH EVERY EVENING WITH DINNER   metoprolol tartrate (LOPRESSOR) 50 MG tablet TAKE ONE TABLET BY MOUTH TWICE A DAY   Multiple Vitamins-Minerals (MULTI COMPLETE PO) Take by mouth.   potassium chloride (KLOR-CON) 8 MEQ tablet TAKE ONE TABLET BY MOUTH DAILY   timolol (TIMOPTIC) 0.5 % ophthalmic solution    No facility-administered encounter medications on file as of 10/25/2021.   Recent Relevant Labs: Lab Results  Component Value Date/Time   HGBA1C 7.2 (A) 07/19/2021 08:48 AM   HGBA1C 7.7 (H) 12/16/2020 09:04 AM   HGBA1C 7.1 (A) 06/24/2020 10:28 AM   HGBA1C 6.2 11/28/2018 08:55  AM   HGBA1C 7.1 09/10/2018 12:00 AM   MICROALBUR 1.7 10/18/2017 08:47 AM   MICROALBUR 3.4 (H) 07/06/2016 11:39 AM    Kidney Function Lab Results  Component Value Date/Time   CREATININE 1.13 12/16/2020 09:04 AM   CREATININE 1.16 12/11/2019 09:33 AM   CREATININE 1.17 11/28/2018 08:55 AM   GFR 64.46 12/16/2020 09:04 AM   GFRNONAA 53.25 06/29/2009 08:48 AM   GFRAA 62 04/27/2007 07:47 AM    Current antihyperglycemic regimen:  Metformin 500 mg, one in the morning and two in the evening  What recent interventions/DTPs have been made to improve glycemic control:  No recent interventions or DTPs.  Have there been any recent hospitalizations or ED visits since last visit with CPP? No  Patient denies hypoglycemic symptoms.  Patient denies hyperglycemic symptoms.  How often are you checking your blood sugar? Once a week.  What are your blood sugars ranging?  Fasting: 99-129 mostly 110  During the week, how often does your blood glucose drop below 70? Never  Are you checking your feet daily/regularly? Yes  Adherence Review: Is the patient currently on a STATIN medication? Yes Is the patient currently on ACE/ARB medication? Yes Does the patient have >5 day gap between last estimated fill dates? No   Care Gaps: Medicare Annual Wellness: Completed 01/22/2020 Ophthalmology Exam: Overdue since 01/31/2021 Foot Exam: Overdue since 11/28/2019 Hemoglobin A1C: 7.7% on 12/16/2020 Colonoscopy: Discontinued, last completed 06/01/2007  Future Appointments  Date Time  Provider [place]  01/17/2022  8:20 AM Vivi Barrack, MD [facility]  02/04/2022  8:45 AM [facility] HEALTH COACH [facility]  02/08/2022  2:00 PM [facility] CCM PHARMACIST [facility]   Star Rating Drugs: Atorvastatin last filled 09/12/2021 90 DS Metformin 500 mg last filled 07/14/2021 90 DS Losartan-HCTZ 100-25 mg last filled 09/12/2021 90 DS  April D Calhoun, [place] Pharmacist Assistant [phone]

## 2021-11-22 ENCOUNTER — Other Ambulatory Visit: Payer: Self-pay | Admitting: Family Medicine

## 2021-12-07 ENCOUNTER — Other Ambulatory Visit: Payer: Self-pay | Admitting: Family Medicine

## 2021-12-13 ENCOUNTER — Other Ambulatory Visit: Payer: Self-pay | Admitting: Family Medicine

## 2021-12-23 ENCOUNTER — Encounter: Payer: Self-pay | Admitting: *Deleted

## 2021-12-27 ENCOUNTER — Other Ambulatory Visit: Payer: Self-pay | Admitting: Family Medicine

## 2022-01-12 ENCOUNTER — Other Ambulatory Visit: Payer: Self-pay | Admitting: Family Medicine

## 2022-01-17 ENCOUNTER — Encounter: Payer: Medicare HMO | Admitting: Family Medicine

## 2022-01-27 NOTE — Progress Notes (Signed)
Care Management & Coordination Services Pharmacy Note  02/08/2022 Name:  Paul Banks MRN:  [id number] DOB:  [birth year]  Summary: PharmD FU visit.  Patient doing well reports glucose 100-110 fasting.  BP 132/74 at home.  Upcoming physical with PCP.  He is due for maintenance labs.  Could consider titration of metformin if A1c elevated.  Question statin adherence last fill report says 09/12/21 90ds.  Recommendations/Changes made from today's visit: Foot exam, A1c, kidney labs next visit Check on statin adherence  Follow up plan: FU 6 months CMA to follow BP   Subjective: Paul Banks is an 77 y.o. year old male who is a primary patient of Jimmey Ralph, Katina Degree, MD.  The care coordination team was consulted for assistance with disease management and care coordination needs.    Engaged with patient by telephone for follow up visit.  Recent office visits:  None   Recent consult visits:  None   Hospital visits:  None in previous 6 months   Objective:  Lab Results  Component Value Date   CREATININE 1.13 12/16/2020   BUN 16 12/16/2020   GFR 64.46 12/16/2020   GFRNONAA 53.25 06/29/2009   GFRAA 62 04/27/2007   NA 141 12/16/2020   K 4.3 12/16/2020   CALCIUM 10.2 12/16/2020   CO2 32 12/16/2020   GLUCOSE 182 (H) 12/16/2020    Lab Results  Component Value Date/Time   HGBA1C 7.2 (A) 07/19/2021 08:48 AM   HGBA1C 7.7 (H) 12/16/2020 09:04 AM   HGBA1C 7.1 (A) 06/24/2020 10:28 AM   HGBA1C 6.2 11/28/2018 08:55 AM   HGBA1C 7.1 09/10/2018 12:00 AM   GFR 64.46 12/16/2020 09:04 AM   GFR 61.37 11/28/2018 08:55 AM   MICROALBUR 1.7 10/18/2017 08:47 AM   MICROALBUR 3.4 (H) 07/06/2016 11:39 AM    Last diabetic Eye exam:  Lab Results  Component Value Date/Time   HMDIABEYEEXA Retinopathy (A) 06/09/2021 02:50 PM    Last diabetic Foot exam: No results found for: "HMDIABFOOTEX"   Lab Results  Component Value Date   CHOL 154 12/16/2020   HDL 44.60 12/16/2020   LDLCALC 81 12/11/2019    LDLDIRECT 89.0 12/16/2020   TRIG 218.0 (H) 12/16/2020   CHOLHDL 3 12/16/2020       Latest Ref Rng & Units 12/16/2020    9:04 AM 12/11/2019    9:33 AM 11/28/2018    8:55 AM  Hepatic Function  Total Protein 6.0 - 8.3 g/dL 8.1  8.5  7.8   Albumin 3.5 - 5.2 g/dL 4.6   4.6   AST 0 - 37 U/L 19  18  20    ALT 0 - 53 U/L 17  20  17    Alk Phosphatase 39 - 117 U/L 60   62   Total Bilirubin 0.2 - 1.2 mg/dL 0.9  0.6  0.9     Lab Results  Component Value Date/Time   TSH 1.71 12/16/2020 09:04 AM   TSH 2.29 12/11/2019 09:33 AM       Latest Ref Rng & Units 12/16/2020    9:04 AM 12/11/2019    9:33 AM 11/28/2018    8:55 AM  CBC  WBC 4.0 - 10.5 K/uL 7.6  9.8  9.8   Hemoglobin 13.0 - 17.0 g/dL 14/01/2019  11/30/2018  01.0   Hematocrit 39.0 - 52.0 % 44.5  46.9  45.3   Platelets 150.0 - 400.0 K/uL 247.0  308  283.0     Lab Results  Component Value Date/Time  VD25OH 36.76 10/18/2017 08:47 AM   VITAMINB12 384 07/01/2009 12:00 AM    Clinical ASCVD: Yes  The 10-year ASCVD risk score (Arnett DK, et al., 2019) is: 42.9%   Values used to calculate the score:     Age: 68 years     Sex: Male     Is Non-Hispanic African American: No     Diabetic: Yes     Tobacco smoker: No     Systolic Blood Pressure: 956 mmHg     Is BP treated: Yes     HDL Cholesterol: 44.6 mg/dL     Total Cholesterol: 154 mg/dL        07/19/2021    8:38 AM 01/21/2021    8:49 AM 12/16/2020    8:14 AM  Depression screen PHQ 2/9  Decreased Interest 0 0 0  Down, Depressed, Hopeless 0 0 0  PHQ - 2 Score 0 0 0     Social History   Tobacco Use  Smoking Status Never  Smokeless Tobacco Never   BP Readings from Last 3 Encounters:  08/01/21 127/68  07/19/21 (!) 150/90  12/16/20 (!) 193/91   Pulse Readings from Last 3 Encounters:  07/19/21 72  12/16/20 78  06/24/20 85   Wt Readings from Last 3 Encounters:  07/19/21 213 lb 9.6 oz (96.9 kg)  12/16/20 217 lb (98.4 kg)  06/24/20 217 lb 4 oz (98.5 kg)   BMI Readings from  Last 3 Encounters:  07/19/21 31.54 kg/m  12/16/20 32.05 kg/m  06/24/20 32.08 kg/m    Allergies  Allergen Reactions   Penicillins     REACTION: Rash   Pioglitazone     REACTION: edema   Tetracycline     REACTION: Rash    Medications Reviewed Today     Reviewed by Edythe Clarity, [facility] (Pharmacist) on 02/08/22 at 1220  Med List Status: <None>   Medication Order Taking? Sig Documenting Provider Last Dose Status Informant  Accu-Chek Softclix Lancets lancets [id number] No USE TO CHECK BLOOD SUGAR 1 TIME PER DAY Vivi Barrack, MD Taking Active   allopurinol (ZYLOPRIM) 100 MG tablet [id number]  TAKE 1/2 TABLET BY MOUTH DAILY Vivi Barrack, MD  Active   amLODipine (NORVASC) 10 MG tablet [id number]  TAKE ONE TABLET BY MOUTH DAILY Vivi Barrack, MD  Active   aspirin 81 MG tablet [id number] No Take 81 mg by mouth daily. [provider] Taking Active   atorvastatin (LIPITOR) 10 MG tablet [id number] No Take 1 tablet (10 mg total) by mouth daily. Vivi Barrack, MD Taking Active   Azelastine HCl 137 MCG/SPRAY SOLN [id number] No SPRAY TWO SPRAYS IN [facility] NOSTRIL TWICE DAILY Vivi Barrack, MD Taking Active   Fish Oil-Cholecalciferol (FISH OIL + D3 PO) [id number] No Take by mouth. [provider] Taking Active   glucose blood (ACCU-CHEK AVIVA PLUS) test strip [id number] No USE TO CHECK BLOOD SUGAR 1 TIME PER DAY Vivi Barrack, MD Taking Active   latanoprost (XALATAN) 0.005 % ophthalmic solution [id number] No SMARTSIG:In Eye(s) [provider] Taking Active   levothyroxine (SYNTHROID) 137 MCG tablet [id number]  TAKE ONE TABLET BY MOUTH DAILY BEFORE BREAKFAST Vivi Barrack, MD  Active   losartan-hydrochlorothiazide [facility]) 100-25 MG tablet [id number]  TAKE ONE TABLET BY MOUTH DAILY Vivi Barrack, MD  Active   metFORMIN (GLUCOPHAGE-XR) 500 MG 24 hr tablet [id number]  TAKE ONE TABLET BY MOUTH EVERY MORNING WITH BREAKFAST AND TAKE TWO TABLETS BY MOUTH EVERY EVENING WITH  Arlean Hopping, MD  Active   metoprolol tartrate (LOPRESSOR) 50 MG tablet [id number]  TAKE ONE TABLET BY MOUTH TWICE A DAY Ardith Dark, MD  Active   Multiple Vitamins-Minerals [facility] COMPLETE PO) [id number] No Take by mouth. [provider] Taking Active   potassium chloride (KLOR-CON) 8 MEQ tablet [id number] No TAKE ONE TABLET BY MOUTH DAILY Ardith Dark, MD Taking Active   timolol (TIMOPTIC) 0.5 % ophthalmic solution [id number] No  [provider] Taking Active             SDOH:  (Social Determinants of Health) assessments and interventions performed: Yes Financial Resource Strain: Low Risk  (02/08/2022)   Overall Financial Resource Strain (CARDIA)    Difficulty of Paying Living Expenses: Not very hard   Food Insecurity: No Food Insecurity (02/08/2022)   Hunger Vital Sign    Worried About Running Out of Food in the Last Year: Never true    Ran Out of Food in the Last Year: Never true    SDOH Interventions    Flowsheet Row Chronic Care Management from 08/26/2019 in [facility]  SDOH Interventions   Transportation Interventions Intervention Not Indicated      SDOH Screenings   Food Insecurity: No Food Insecurity (02/08/2022)  Housing: Low Risk  (01/21/2021)  Transportation Needs: No Transportation Needs (01/21/2021)  Depression (PHQ2-9): Low Risk  (07/19/2021)  Financial Resource Strain: Low Risk  (02/08/2022)  Physical Activity: Sufficiently Active (01/21/2021)  Social Connections: Moderately Isolated (01/21/2021)  Stress: No Stress Concern Present (01/21/2021)  Tobacco Use: Low Risk  (07/19/2021)    Medication Assistance: None required.  Patient affirms current coverage meets needs.  Medication Access: Within the past 30 days, how often has patient missed a dose of medication? 0 Is a pillbox or other method used to improve adherence? No  Factors that may affect medication adherence? no barriers identified Are meds synced by  current pharmacy? No  Are meds delivered by current pharmacy? No  Does patient experience delays in picking up medications due to transportation concerns? No   [facility] Reviewed: Is patient disadvantaged to use [facility]?: No  Current Rx insurance plan: [employer] Name and location of Current pharmacy:  [id number] - [address] [place] - [address] [place] [address] Phone: [phone] Fax: [phone]  [facility] services reviewed with patient today?: Yes  Patient requests to transfer care to [facility]?: No  Reason patient declined to change pharmacies: Loyalty to other pharmacy/Patient preference  Compliance/Adherence/Medication fill history: Care Gaps: UACR, A1c, Foot exam, AWV  Star-Rating Drugs: Metformin ER 500mg  01/12/22 90ds LosartanHCTZ 100-25mg  12/28/21 90ds Atorvastatin 10mg  09/12/21 90ds   Assessment/Plan     Hypertension (BP goal <140/90) 02/08/22 -Controlled based on home readings provided.  History of elevated office readings and normal home readings. -No echo on file  -Current treatment: Metoprolol tartrate 50 mg twice daily Appropriate, Effective, Safe, Accessible Amlodipine 10 mg daily Appropriate, Effective, Safe, Accessible Losartan-Hydrochlorothiazide 100-25mg  once daily Appropriate, Effective, Safe, Accessible -Current home readings: 132/74 last time he checked -Denies hypotensive/hypertensive symptoms -Educated on BP goals and benefits of medications for prevention of heart attack, stroke and kidney damage; Daily salt intake goal < 2300 mg; Exercise goal of 150 minutes per week; -Continues same medication as before - denies any dizziness or HA. -Monitor in office at next FU visit. Will have CMA check in regularly on BP and if any changes are made to  DM meds.  Update 09/29/20 Recent BP readings - last BP Sunday 121/84 P 66 137/87 P 61, 140/85 P 59, 132/82 P 61 Reports  that since he started new drops in his eyes he feels his BP has improved.  Denies any dizziness or HA's Continues to be active. Continue current meds for now - will continue to follow BP.  Update 02/01/21 130/82, 148/87, 139/84, 145/83, 139/90 - most recent BP at home. He is monitoring regularly and denies any symptoms at home or severely elevated BP like he has in office approx. 1 month ago. He is following up with Dr. Jerline Pain in June Encouraged him to continue to monitor - contact me if consistently > 140/90. No changes to meds at this time He continues to work on lifestyle - has lost a few pounds per his report watching portions and exercising.  Diabetes (A1c goal <7%) 02/08/22 -Not controlled, most recent A1c of 7.2 -Current medications: Metformin XR 500 mg - one tablet qam and two tablets qpm Appropriate, Effective, Safe, Accessible -Medications previously tried: n/a  -Current home glucose readings fasting glucose: 103, 99, 106, 107 post prandial glucose: did not obtain -Denies hypoglycemic/hyperglycemic symptoms -Current meal patterns: watches carbs, some sweets like chocolate, grains, minimal processed foods -Current exercise: same see previous -He has upcoming visit with PCP. -DUE FOR:  Diabetic kidney eval, foot exam, eye exam, and A1c -Continue current medications, if A1c elevated could titrate metformin dose.  Update 09/29/20 120, 103, 97, 99, 115, 132, 117 are recent glucose readings Congratulated him on good readings. Continues to take 1500mg  of metformin daily. Discussed diet which seems to be appropriate for maintaining euglycemia. Continue current meds for now - recheck A1c.  Update 02/01/21 117, 93, 84, 105, 93, 101, 91 - recent home glucose readings His A1c increased to 7.7 during last OV. We discussed potential ways to bring this down. He is adherent with metformin, currently working on exercise and dietary mods. Limiting carbs/sweets.  Admits this was hard  during holidays. 4-5 pound weight loss. Recommend checking A1c next OV, if still elevated could consider increase to metformin 100mg  BID.          Beverly Milch, PharmD Clinical Pharmacist  [facility] [phone]

## 2022-02-07 DIAGNOSIS — H401131 Primary open-angle glaucoma, bilateral, mild stage: Secondary | ICD-10-CM | POA: Diagnosis not present

## 2022-02-07 DIAGNOSIS — H25813 Combined forms of age-related cataract, bilateral: Secondary | ICD-10-CM | POA: Diagnosis not present

## 2022-02-07 DIAGNOSIS — H04123 Dry eye syndrome of bilateral lacrimal glands: Secondary | ICD-10-CM | POA: Diagnosis not present

## 2022-02-08 ENCOUNTER — Ambulatory Visit: Payer: Medicare HMO | Admitting: Pharmacist

## 2022-02-16 ENCOUNTER — Encounter: Payer: Self-pay | Admitting: Family Medicine

## 2022-02-16 ENCOUNTER — Ambulatory Visit (INDEPENDENT_AMBULATORY_CARE_PROVIDER_SITE_OTHER): Payer: Medicare HMO | Admitting: Family Medicine

## 2022-02-16 VITALS — BP 178/99 | HR 77 | Temp 97.5°F | Ht 69.0 in | Wt 213.6 lb

## 2022-02-16 DIAGNOSIS — E039 Hypothyroidism, unspecified: Secondary | ICD-10-CM | POA: Diagnosis not present

## 2022-02-16 DIAGNOSIS — M1A9XX0 Chronic gout, unspecified, without tophus (tophi): Secondary | ICD-10-CM | POA: Diagnosis not present

## 2022-02-16 DIAGNOSIS — Z0001 Encounter for general adult medical examination with abnormal findings: Secondary | ICD-10-CM

## 2022-02-16 DIAGNOSIS — N183 Chronic kidney disease, stage 3 unspecified: Secondary | ICD-10-CM | POA: Diagnosis not present

## 2022-02-16 DIAGNOSIS — E1159 Type 2 diabetes mellitus with other circulatory complications: Secondary | ICD-10-CM | POA: Diagnosis not present

## 2022-02-16 DIAGNOSIS — I152 Hypertension secondary to endocrine disorders: Secondary | ICD-10-CM | POA: Diagnosis not present

## 2022-02-16 DIAGNOSIS — E785 Hyperlipidemia, unspecified: Secondary | ICD-10-CM

## 2022-02-16 DIAGNOSIS — E1169 Type 2 diabetes mellitus with other specified complication: Secondary | ICD-10-CM | POA: Diagnosis not present

## 2022-02-16 DIAGNOSIS — E1122 Type 2 diabetes mellitus with diabetic chronic kidney disease: Secondary | ICD-10-CM

## 2022-02-16 LAB — CBC
HCT: 44.8 % (ref 39.0–52.0)
Hemoglobin: 15.4 g/dL (ref 13.0–17.0)
MCHC: 34.3 g/dL (ref 30.0–36.0)
MCV: 86.4 fl (ref 78.0–100.0)
Platelets: 266 10*3/uL (ref 150.0–400.0)
RBC: 5.19 Mil/uL (ref 4.22–5.81)
RDW: 14.6 % (ref 11.5–15.5)
WBC: 8.1 10*3/uL (ref 4.0–10.5)

## 2022-02-16 LAB — COMPREHENSIVE METABOLIC PANEL
ALT: 17 U/L (ref 0–53)
AST: 17 U/L (ref 0–37)
Albumin: 4.8 g/dL (ref 3.5–5.2)
Alkaline Phosphatase: 59 U/L (ref 39–117)
BUN: 15 mg/dL (ref 6–23)
CO2: 29 mEq/L (ref 19–32)
Calcium: 10.2 mg/dL (ref 8.4–10.5)
Chloride: 99 mEq/L (ref 96–112)
Creatinine, Ser: 1.17 mg/dL (ref 0.40–1.50)
GFR: 61.32 mL/min (ref >60.00)
Glucose, Bld: 190 mg/dL — ABNORMAL HIGH (ref 70–99)
Potassium: 4.3 mEq/L (ref 3.5–5.1)
Sodium: 137 mEq/L (ref 135–145)
Total Bilirubin: 0.7 mg/dL (ref 0.2–1.2)
Total Protein: 8.1 g/dL (ref 6.0–8.3)

## 2022-02-16 LAB — LIPID PANEL
Cholesterol: 157 mg/dL (ref 0–200)
HDL: 41.7 mg/dL (ref >39.00)
NonHDL: 115.11
Total CHOL/HDL Ratio: 4
Triglycerides: 256 mg/dL — ABNORMAL HIGH (ref 0.0–149.0)
VLDL: 51.2 mg/dL — ABNORMAL HIGH (ref 0.0–40.0)

## 2022-02-16 LAB — TSH: TSH: 0.58 u[IU]/mL (ref 0.35–5.50)

## 2022-02-16 LAB — MICROALBUMIN / CREATININE URINE RATIO
Creatinine,U: 100.5 mg/dL
Microalb Creat Ratio: 4.6 mg/g (ref 0.0–30.0)
Microalb, Ur: 4.6 mg/dL — ABNORMAL HIGH (ref 0.0–1.9)

## 2022-02-16 LAB — URIC ACID: Uric Acid, Serum: 4.4 mg/dL (ref 4.0–7.8)

## 2022-02-16 LAB — LDL CHOLESTEROL, DIRECT: Direct LDL: 82 mg/dL

## 2022-02-16 LAB — HEMOGLOBIN A1C: Hgb A1c MFr Bld: 7.5 % — ABNORMAL HIGH (ref 4.6–6.5)

## 2022-02-16 NOTE — Assessment & Plan Note (Signed)
Check TSH.  On Synthroid 137 mcg daily.

## 2022-02-16 NOTE — Assessment & Plan Note (Signed)
We discussed lifestyle modifications.  Continue metformin 500 mg in the morning and 1000 mg in the evening.  Check A1c today.

## 2022-02-16 NOTE — Patient Instructions (Signed)
It was very nice to see you today!  We will check blood work today.  Please continue to work on diet and exercise.  We will see you back in 6 months depending on results of blood work.  I will see back in a year for your next physical.  Take care, Dr Jerline Pain  PLEASE NOTE:  If you had any lab tests, please let us know if you have not heard back within a few days. You may see your results on mychart before we have a chance to review them but we will give you a call once they are reviewed by [place].   If we ordered any referrals today, please let us know if you have not heard from their office within the next week.   If you had any urgent prescriptions sent in today, please check with the pharmacy within an hour of our visit to make sure the prescription was transmitted appropriately.   Please try these tips to maintain a healthy lifestyle:  Eat at least 3 REAL meals and 1-2 snacks per day.  Aim for no more than 5 hours between eating.  If you eat breakfast, please do so within one hour of getting up.   Each meal should contain half fruits/vegetables, one quarter protein, and one quarter carbs (no bigger than a computer mouse)  Cut down on sweet beverages. This includes juice, soda, and sweet tea.   Drink at least 1 glass of water with each meal and aim for at least 8 glasses per day  Exercise at least 150 minutes every week.    Preventive Care 51 Years and Older, Male Preventive care refers to lifestyle choices and visits with your health care provider that can promote health and wellness. Preventive care visits are also called wellness exams. What can I expect for my preventive care visit? Counseling During your preventive care visit, your health care provider may ask about your: Medical history, including: Past medical problems. Family medical history. History of falls. Current health, including: Emotional well-being. Home life and relationship well-being. Sexual  activity. Memory and ability to understand (cognition). Lifestyle, including: Alcohol, nicotine or tobacco, and drug use. Access to firearms. Diet, exercise, and sleep habits. Work and work Statistician. Sunscreen use. Safety issues such as seatbelt and bike helmet use. Physical exam Your health care provider will check your: Height and weight. These may be used to calculate your BMI (body mass index). BMI is a measurement that tells if you are at a healthy weight. Waist circumference. This measures the distance around your waistline. This measurement also tells if you are at a healthy weight and may help predict your risk of certain diseases, such as type 2 diabetes and high blood pressure. Heart rate and blood pressure. Body temperature. Skin for abnormal spots. What immunizations do I need?  Vaccines are usually given at various ages, according to a schedule. Your health care provider will recommend vaccines for you based on your age, medical history, and lifestyle or other factors, such as travel or where you work. What tests do I need? Screening Your health care provider may recommend screening tests for certain conditions. This may include: Lipid and cholesterol levels. Diabetes screening. This is done by checking your blood sugar (glucose) after you have not eaten for a while (fasting). Hepatitis C test. Hepatitis B test. HIV (human immunodeficiency virus) test. STI (sexually transmitted infection) testing, if you are at risk. Lung cancer screening. Colorectal cancer screening. Prostate cancer screening. Abdominal aortic  aneurysm (AAA) screening. You may need this if you are a current or former smoker. Talk with your health care provider about your test results, treatment options, and if necessary, the need for more tests. Follow these instructions at home: Eating and drinking  Eat a diet that includes fresh fruits and vegetables, whole grains, lean protein, and low-fat  dairy products. Limit your intake of foods with high amounts of sugar, saturated fats, and salt. Take vitamin and mineral supplements as recommended by your health care provider. Do not drink alcohol if your health care provider tells you not to drink. If you drink alcohol: Limit how much you have to 0-2 drinks a day. Know how much alcohol is in your drink. In the [place] one drink equals one 12 oz bottle of beer (355 mL), one 5 oz glass of wine (148 mL), or one 1 oz glass of hard liquor (44 mL). Lifestyle Brush your teeth every morning and night with fluoride toothpaste. Floss one time each day. Exercise for at least 30 minutes 5 or more days each week. Do not use any products that contain nicotine or tobacco. These products include cigarettes, chewing tobacco, and vaping devices, such as e-cigarettes. If you need help quitting, ask your health care provider. Do not use drugs. If you are sexually active, practice safe sex. Use a condom or other form of protection to prevent STIs. Take aspirin only as told by your health care provider. Make sure that you understand how much to take and what form to take. Work with your health care provider to find out whether it is safe and beneficial for you to take aspirin daily. Ask your health care provider if you need to take a cholesterol-lowering medicine (statin). Find healthy ways to manage stress, such as: Meditation, yoga, or listening to music. Journaling. Talking to a trusted person. Spending time with friends and family. Safety Always wear your seat belt while driving or riding in a vehicle. Do not drive: If you have been drinking alcohol. Do not ride with someone who has been drinking. When you are tired or distracted. While texting. If you have been using any mind-altering substances or drugs. Wear a helmet and other protective equipment during sports activities. If you have firearms in your house, make sure you follow all gun safety  procedures. Minimize exposure to UV radiation to reduce your risk of skin cancer. What's next? Visit your health care provider once a year for an annual wellness visit. Ask your health care provider how often you should have your eyes and teeth checked. Stay up to date on all vaccines. This information is not intended to replace advice given to you by your health care provider. Make sure you discuss any questions you have with your health care provider. Document Revised: 06/24/2020 Document Reviewed: 06/24/2020 Elsevier Patient Education  [employer].

## 2022-02-16 NOTE — Assessment & Plan Note (Signed)
On Lipitor 10 mg daily.  Check lipids. °

## 2022-02-16 NOTE — Assessment & Plan Note (Signed)
No recent flares.  Continue allopurinol 50mg  daily.

## 2022-02-16 NOTE — Assessment & Plan Note (Signed)
Elevated today though all of his home readings have been at goal.  Has some element of whitecoat hypertension.  He will continue to monitor at home and let us know if persistently elevated.  Continue current regimen amlodipine 10 mg daily, losartan-HCTZ 100-25 once daily, metoprolol tartrate 50 mg twice daily.

## 2022-02-16 NOTE — Progress Notes (Signed)
Chief Complaint:  Paul Banks is a 77 y.o. male who presents today for his annual comprehensive physical exam.    Assessment/Plan:  Chronic Problems Addressed Today: Hypothyroidism Check TSH.  On Synthroid 137 mcg daily.  Dyslipidemia associated with type 2 diabetes mellitus (HCC) On Lipitor 10 mg daily.  Check lipids.  Gout No recent flares.  Continue allopurinol 50mg  daily.   Diabetes ([place]) We discussed lifestyle modifications.  Continue metformin 500 mg in the morning and 1000 mg in the evening.  Check A1c today.  Hypertension associated with diabetes ([place]) Elevated today though all of his home readings have been at goal.  Has some element of whitecoat hypertension.  He will continue to monitor at home and let us know if persistently elevated.  Continue current regimen amlodipine 10 mg daily, losartan-HCTZ 100-25 once daily, metoprolol tartrate 50 mg twice daily.  Preventative Healthcare: Check labs. UTD on vaccines.  Had Cologuard last year which was negative.  Patient Counseling(The following topics were reviewed and/or handout was given):  -Nutrition: Stressed importance of moderation in sodium/caffeine intake, saturated fat and cholesterol, caloric balance, sufficient intake of fresh fruits, vegetables, and fiber.  -Stressed the importance of regular exercise.   -Substance Abuse: Discussed cessation/primary prevention of tobacco, alcohol, or other drug use; driving or other dangerous activities under the influence; availability of treatment for abuse.   -Injury prevention: Discussed safety belts, safety helmets, smoke detector, smoking near bedding or upholstery.   -Sexuality: Discussed sexually transmitted diseases, partner selection, use of condoms, avoidance of unintended pregnancy and contraceptive alternatives.   -Dental health: Discussed importance of regular tooth brushing, flossing, and dental visits.  -Health maintenance and immunizations reviewed. Please refer to  Health maintenance section.  Return to care in 1 year for next preventative visit.     Subjective:  HPI:  He has no acute complaints today. See A/p for status of chronic conditions.   Lifestyle Diet: Balanced. Plenty of fruits and vegetables.  Exercise: Does a lot of walking.      02/16/2022    7:55 AM  Depression screen PHQ 2/9  Decreased Interest 0  Down, Depressed, Hopeless 0  PHQ - 2 Score 0    Health Maintenance Due  Topic Date Due   Diabetic kidney evaluation - Urine ACR  10/19/2018   FOOT EXAM  11/28/2019   Diabetic kidney evaluation - eGFR measurement  12/16/2021   Medicare Annual Wellness (AWV)  01/21/2022   HEMOGLOBIN A1C  01/19/2022     ROS: Per HPI, otherwise a complete review of systems was negative.   PMH:  The following were reviewed and entered/updated in epic: Past Medical History:  Diagnosis Date   ANEMIA 07/01/2009   COUGH DUE TO ACE INHIBITORS 09/10/2008   DM 06/12/2008   FATTY LIVER DISEASE 06/12/2008   Gout, unspecified 07/01/2009   HYPERCHOLESTEROLEMIA 05/02/2007   HYPERTENSION 08/17/2006   HYPOTHYROIDISM 08/17/2006   Mild renal insufficiency    w/u NEG   NASH (nonalcoholic steatohepatitis)    Patient Active Problem List   Diagnosis Date Noted   Glaucoma 12/16/2020   Hypertension associated with diabetes ([place]) 11/08/2017   Diabetes ([place]) 03/04/2015   Hypercalcemia 10/10/2012   Renal insufficiency 10/10/2012   Gout 07/01/2009   COUGH DUE TO ACE INHIBITORS 09/10/2008   NASH (nonalcoholic steatohepatitis) 06/12/2008   Dyslipidemia associated with type 2 diabetes mellitus ([place]) 05/02/2007   Hypothyroidism 08/17/2006   History reviewed. No pertinent surgical history.  Family History  Problem Relation Age of  Onset   Cancer Mother        Breast Cancer   Cancer Sister        Breast Cancer    Medications- reviewed and updated Current Outpatient Medications  Medication Sig Dispense Refill   Accu-Chek Softclix Lancets lancets USE TO CHECK  BLOOD SUGAR 1 TIME PER DAY 100 each 2   allopurinol (ZYLOPRIM) 100 MG tablet TAKE 1/2 TABLET BY MOUTH DAILY 45 tablet 3   amLODipine (NORVASC) 10 MG tablet TAKE ONE TABLET BY MOUTH DAILY 90 tablet 3   aspirin 81 MG tablet Take 81 mg by mouth daily.     atorvastatin (LIPITOR) 10 MG tablet Take 1 tablet (10 mg total) by mouth daily. 90 tablet 3   Azelastine HCl 137 MCG/SPRAY SOLN SPRAY TWO SPRAYS IN EACH NOSTRIL TWICE DAILY 30 mL 12   Fish Oil-Cholecalciferol (FISH OIL + D3 PO) Take by mouth.     glucose blood (ACCU-CHEK AVIVA PLUS) test strip USE TO CHECK BLOOD SUGAR 1 TIME PER DAY 100 each 1   latanoprost (XALATAN) 0.005 % ophthalmic solution SMARTSIG:In Eye(s)     levothyroxine (SYNTHROID) 137 MCG tablet TAKE ONE TABLET BY MOUTH DAILY BEFORE BREAKFAST 90 tablet 3   losartan-hydrochlorothiazide (HYZAAR) 100-25 MG tablet TAKE ONE TABLET BY MOUTH DAILY 90 tablet 0   metFORMIN (GLUCOPHAGE-XR) 500 MG 24 hr tablet TAKE ONE TABLET BY MOUTH EVERY MORNING WITH BREAKFAST AND TAKE TWO TABLETS BY MOUTH EVERY EVENING WITH DINNER 270 tablet 1   metoprolol tartrate (LOPRESSOR) 50 MG tablet TAKE ONE TABLET BY MOUTH TWICE A DAY 180 tablet 3   Multiple Vitamins-Minerals (MULTI COMPLETE PO) Take by mouth.     potassium chloride (KLOR-CON) 8 MEQ tablet TAKE ONE TABLET BY MOUTH DAILY 90 tablet 2   timolol (TIMOPTIC) 0.5 % ophthalmic solution      No current facility-administered medications for this visit.    Allergies-reviewed and updated Allergies  Allergen Reactions   Penicillins     REACTION: Rash   Pioglitazone     REACTION: edema   Tetracycline     REACTION: Rash    Social History   Socioeconomic History   Marital status: Single    Spouse name: Not on file   Number of children: Not on file   Years of education: Not on file   Highest education level: Not on file  Occupational History   Occupation: Banker    Comment: for [employer]  Tobacco Use   Smoking status: Never   Smokeless  tobacco: Never  Substance and Sexual Activity   Alcohol use: Not on file   Drug use: Not on file   Sexual activity: Not on file  Other Topics Concern   Not on file  Social History Narrative   Not on file   Social Determinants of Health   Financial Resource Strain: Low Risk  (02/08/2022)   Overall Financial Resource Strain (CARDIA)    Difficulty of Paying Living Expenses: Not very hard  Food Insecurity: No Food Insecurity (02/08/2022)   Hunger Vital Sign    Worried About Running Out of Food in the Last Year: Never true    Ran Out of Food in the Last Year: Never true  Transportation Needs: No Transportation Needs (01/21/2021)   PRAPARE - Hydrologist (Medical): No    Lack of Transportation (Non-Medical): No  Physical Activity: Sufficiently Active (01/21/2021)   Exercise Vital Sign    Days of Exercise per Week: 5  days    Minutes of Exercise per Session: 30 min  Stress: No Stress Concern Present (01/21/2021)   [place]    Feeling of Stress : Not at all  Social Connections: Moderately Isolated (01/21/2021)   Social Connection and Isolation Panel [NHANES]    Frequency of Communication with Friends and Family: More than three times a week    Frequency of Social Gatherings with Friends and Family: More than three times a week    Attends Religious Services: More than 4 times per year    Active Member of [employer] or Organizations: No    Attends Music therapist: Never    Marital Status: Never married        Objective:  Physical Exam: BP (!) 178/99   Pulse 77   Temp (!) 97.5 F (36.4 C) (Temporal)   Ht 5\' 9"  (1.753 m)   Wt 213 lb 9.6 oz (96.9 kg)   SpO2 98%   BMI 31.54 kg/m   Body mass index is 31.54 kg/m. Wt Readings from Last 3 Encounters:  02/16/22 213 lb 9.6 oz (96.9 kg)  07/19/21 213 lb 9.6 oz (96.9 kg)  12/16/20 217 lb (98.4 kg)   Gen: NAD, resting  comfortably HEENT: TMs normal bilaterally. OP clear. No thyromegaly noted.  CV: RRR with no murmurs appreciated Pulm: NWOB, CTAB with no crackles, wheezes, or rhonchi GI: Normal bowel sounds present. Soft, Nontender, Nondistended. MSK: no edema, cyanosis, or clubbing noted Skin: warm, dry Neuro: CN2-12 grossly intact. Strength 5/5 in upper and lower extremities. Reflexes symmetric and intact bilaterally.  Psych: Normal affect and thought content     Deandre Brannan M. Jerline Pain, MD 02/16/2022 9:00 AM

## 2022-02-18 NOTE — Progress Notes (Signed)
Please inform patient of the following:  A1c is stable at 7.5.  We can continue his current medication regimen and have him come back to recheck in 3 to 6 months.  His cholesterol levels are stable.  All his other labs are normal.  Do not need to make any other changes to his treatment plan at this time.  We can recheck everything else in a year.

## 2022-02-22 ENCOUNTER — Ambulatory Visit (INDEPENDENT_AMBULATORY_CARE_PROVIDER_SITE_OTHER): Payer: Medicare HMO

## 2022-02-22 ENCOUNTER — Other Ambulatory Visit: Payer: Self-pay | Admitting: Family Medicine

## 2022-02-22 DIAGNOSIS — Z0000 Encounter for general adult medical examination without abnormal findings: Secondary | ICD-10-CM

## 2022-02-22 NOTE — Progress Notes (Signed)
I connected with  [name] on 02/22/22 by a audio enabled telemedicine application and verified that I am speaking with the correct person using two identifiers.  Patient Location: Home  Provider Location: Office/Clinic  I discussed the limitations of evaluation and management by telemedicine. The patient expressed understanding and agreed to proceed.   Subjective:   Paul Banks is a 77 y.o. male who presents for Medicare Annual/Subsequent preventive examination.  Review of Systems     Cardiac Risk Factors include: advanced age (>50mn, >>27women);male gender;dyslipidemia;diabetes mellitus;obesity (BMI >30kg/m2);hypertension     Objective:    There were no vitals filed for this visit. There is no height or weight on file to calculate BMI.     02/22/2022    8:22 AM 01/21/2021    8:51 AM 09/25/2017    1:40 PM 07/06/2016   11:16 AM 10/01/2014   10:32 AM  Advanced Directives  Does Patient Have a Medical Advance Directive? Yes Yes No No No  Type of [place] will [place] Chart? No - copy requested No - copy requested       Current Medications (verified) Outpatient Encounter Medications as of 02/22/2022  Medication Sig   Accu-Chek Softclix Lancets lancets USE TO CHECK BLOOD SUGAR 1 TIME PER DAY   allopurinol (ZYLOPRIM) 100 MG tablet TAKE 1/2 TABLET BY MOUTH DAILY   amLODipine (NORVASC) 10 MG tablet TAKE ONE TABLET BY MOUTH DAILY   aspirin 81 MG tablet Take 81 mg by mouth daily.   atorvastatin (LIPITOR) 10 MG tablet Take 1 tablet (10 mg total) by mouth daily.   Azelastine HCl 137 MCG/SPRAY SOLN SPRAY TWO SPRAYS IN EACH NOSTRIL TWICE DAILY   Fish Oil-Cholecalciferol (FISH OIL + D3 PO) Take by mouth.   glucose blood (ACCU-CHEK AVIVA PLUS) test strip USE TO CHECK BLOOD SUGAR 1 TIME PER DAY   latanoprost (XALATAN) 0.005 % ophthalmic solution SMARTSIG:In Eye(s)    levothyroxine (SYNTHROID) 137 MCG tablet TAKE ONE TABLET BY MOUTH DAILY BEFORE BREAKFAST   losartan-hydrochlorothiazide (HYZAAR) 100-25 MG tablet TAKE ONE TABLET BY MOUTH DAILY   metFORMIN (GLUCOPHAGE-XR) 500 MG 24 hr tablet TAKE ONE TABLET BY MOUTH EVERY MORNING WITH BREAKFAST AND TAKE TWO TABLETS BY MOUTH EVERY EVENING WITH DINNER   metoprolol tartrate (LOPRESSOR) 50 MG tablet TAKE ONE TABLET BY MOUTH TWICE A DAY   Multiple Vitamins-Minerals (MULTI COMPLETE PO) Take by mouth.   potassium chloride (KLOR-CON) 8 MEQ tablet TAKE ONE TABLET BY MOUTH DAILY   timolol (TIMOPTIC) 0.5 % ophthalmic solution    No facility-administered encounter medications on file as of 02/22/2022.    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 No pertinent surgical history. Family History  Problem Relation Age of Onset   Cancer Mother        Breast Cancer   Cancer Sister        Breast Cancer   Social History   Socioeconomic History   Marital status: Single    Spouse name: Not on file  Number of children: Not on file   Years of education: Not on file   Highest education level: Not on file  Occupational History   Occupation: Banker    Comment: for [employer]  Tobacco Use   Smoking status: Never   Smokeless tobacco: Never  Substance and Sexual Activity   Alcohol use: Not on file   Drug use: Not on file   Sexual activity: Not on file  Other Topics Concern   Not on file  Social History Narrative   Not on file   Social Determinants of Health   Financial Resource Strain: Low Risk  (02/22/2022)   Overall  Financial Resource Strain (CARDIA)    Difficulty of Paying Living Expenses: Not hard at all  Food Insecurity: No Food Insecurity (02/22/2022)   Hunger Vital Sign    Worried About Running Out of Food in the Last Year: Never true    [place] in the Last Year: Never true  Transportation Needs: No Transportation Needs (02/22/2022)   PRAPARE - Hydrologist (Medical): No    Lack of Transportation (Non-Medical): No  Physical Activity: Sufficiently Active (02/22/2022)   Exercise Vital Sign    Days of Exercise per Week: 5 days    Minutes of Exercise per Session: 30 min  Stress: No Stress Concern Present (02/22/2022)   [place]    Feeling of Stress : Not at all  Social Connections: Moderately Isolated (02/22/2022)   Social Connection and Isolation Panel [NHANES]    Frequency of Communication with Friends and Family: Three times a week    Frequency of Social Gatherings with Friends and Family: More than three times a week    Attends Religious Services: More than 4 times per year    Active Member of [employer] or Organizations: No    Attends Music therapist: Never    Marital Status: Never married    Tobacco Counseling Counseling given: Not Answered   Clinical Intake:  Pre-visit preparation completed: Yes  Pain : No/denies pain     BMI - recorded: 31.54 Nutritional Status: BMI > 30  Obese Nutritional Risks: None Diabetes: Yes CBG done?: No Did pt. bring in CBG monitor from home?: No  How often do you need to have someone help you when you read instructions, pamphlets, or other written materials from your doctor or pharmacy?: 1 - Never  Diabetic?Nutrition Risk Assessment:  Has the patient had any N/V/D within the last 2 months?  No  Does the patient have any non-healing wounds?  No  Has the patient had any unintentional weight loss or weight gain?  No   Diabetes:  Is  the patient diabetic?  Yes  If diabetic, was a CBG obtained today?  No  Did the patient bring in their glucometer from home?  No  How often do you monitor your CBG's? Once a week .   Financial Strains and Diabetes Management:  Are you having any financial strains with the device, your supplies or your medication? No .  Does the patient want to be seen by Chronic Care Management for management of their diabetes?  No  Would the patient like to be referred to a Nutritionist or for Diabetic Management?  No   Diabetic Exams:  Diabetic Eye Exam: Completed 06/09/21 Diabetic Foot Exam: Overdue, Pt has been advised about the importance in completing this exam. Pt is scheduled for diabetic foot exam  on next appt .   Interpreter Needed?: No  Information entered by :: Charlott Rakes, LPN   Activities of Daily Living    02/22/2022    8:23 AM  In your present state of health, do you have any difficulty performing the following activities:  Hearing? 0  Vision? 0  Difficulty concentrating or making decisions? 0  Walking or climbing stairs? 0  Dressing or bathing? 0  Doing errands, shopping? 0  Preparing Food and eating ? N  Using the Toilet? N  In the past six months, have you accidently leaked urine? N  Do you have problems with loss of bowel control? N  Managing your Medications? N  Managing your Finances? N  Housekeeping or managing your Housekeeping? N    Patient Care Team: Vivi Barrack, MD as PCP - General (Family Medicine) Calvert Cantor, MD as Consulting Physician (Ophthalmology) Edythe Clarity, [facility] (Pharmacist)  Indicate any recent Medical Services you may have received from other than [facility] providers in the past year (date may be approximate).     Assessment:   This is a routine wellness examination for [place].  Hearing/Vision screen Hearing Screening - Comments:: Pt denies any hearing issues  Vision Screening - Comments:: Pt follows up with [facility] with Dr Eulas Post  for annual eye exams   Dietary issues and exercise activities discussed: Current Exercise Habits: Home exercise routine, Type of exercise: walking;strength training/weights, Time (Minutes): 30, Frequency (Times/Week): 5, Weekly Exercise (Minutes/Week): 150   Goals Addressed             This Visit's Progress    Patient Stated       Lose weight and maintain health        Depression Screen    02/22/2022    8:20 AM 02/16/2022    7:55 AM 07/19/2021    8:38 AM 01/21/2021    8:49 AM 12/16/2020    8:14 AM 06/24/2020   10:19 AM 12/11/2019    8:24 AM  PHQ 2/9 Scores  PHQ - 2 Score 0 0 0 0 0 0 0  PHQ- 9 Score      0     Fall Risk    02/22/2022    8:23 AM 02/16/2022    7:55 AM 07/19/2021    8:38 AM 01/21/2021    8:52 AM 12/16/2020    8:14 AM  Fall Risk   Falls in the past year? 0 0 0 0 0  Number falls in past yr: 0 0 0 0 0  Injury with Fall? 0 0 0 0 0  Risk for fall due to : Impaired vision No Fall Risks No Fall Risks Impaired vision   Follow up Falls prevention discussed   Falls prevention discussed     FALL RISK PREVENTION PERTAINING TO THE HOME:  Any stairs in or around the home? No  If so, are there any without handrails? No  Home free of loose throw rugs in walkways, pet beds, electrical cords, etc? Yes  Adequate lighting in your home to reduce risk of falls? Yes   ASSISTIVE DEVICES UTILIZED TO PREVENT FALLS:  Life alert? Yes  Use of a cane, walker or w/c? No  Grab bars in the bathroom? Yes  Shower chair or bench in shower? No  Elevated toilet seat or a handicapped toilet? No   TIMED UP AND GO:  Was the test performed? No .   Cognitive Function:        02/22/2022  8:24 AM 01/21/2021    8:54 AM  6CIT Screen  What Year? 0 points 0 points  What month? 0 points 0 points  What time? 0 points 0 points  Count back from 20 0 points 0 points  Months in reverse 0 points 0 points  Repeat phrase 0 points 0 points  Total Score 0 points 0 points     Immunizations Immunization History  Administered Date(s) Administered   Fluad Quad(high Dose 65+) 12/11/2019, 11/28/2021   Hepatitis A, Adult 09/24/2018   Influenza, Quadrivalent, Recombinant, Inj, Pf 12/02/2018   Influenza-Unspecified 10/10/2012   Janssen (J&J) SARS-COV-2 Vaccination 03/22/2019   Pneumococcal Conjugate-13 11/13/2013   Pneumococcal Polysaccharide-23 09/08/2010, 11/08/2017   Rsv, Bivalent, Protein Subunit Rsvpref,pf Evans Lance) 11/28/2021   Td 01/10/2002   Tdap 11/13/2013   Zoster Recombinat (Shingrix) 09/24/2018, 12/02/2018   Zoster, Live 03/04/2015    TDAP status: Up to date  Flu Vaccine status: Up to date  Pneumococcal vaccine status: Up to date  Covid-19 vaccine status: Completed vaccines  Qualifies for Shingles Vaccine? Yes   Zostavax completed Yes   Shingrix Completed?: Yes  Screening Tests Health Maintenance  Topic Date Due   FOOT EXAM  11/28/2019   OPHTHALMOLOGY EXAM  06/10/2022   HEMOGLOBIN A1C  08/17/2022   Diabetic kidney evaluation - eGFR measurement  02/17/2023   Diabetic kidney evaluation - Urine ACR  02/17/2023   Medicare Annual Wellness (AWV)  02/23/2023   DTaP/Tdap/Td (3 - Td or Tdap) 11/14/2023   Fecal DNA (Cologuard)  03/22/2024   Pneumonia Vaccine 30+ Years old  Completed   INFLUENZA VACCINE  Completed   Hepatitis C Screening  Completed   Zoster Vaccines- Shingrix  Completed   HPV VACCINES  Aged Out   COLONOSCOPY (Pts 45-76yr Insurance coverage will need to be confirmed)  Discontinued   COVID-19 Vaccine  Discontinued    Health Maintenance  Health Maintenance Due  Topic Date Due   FOOT EXAM  11/28/2019    Colorectal cancer screening: Type of screening: Cologuard. Completed 03/22/21. Repeat every 3 years    Additional Screening:  Hepatitis C Screening:  Completed 07/06/16  Vision Screening: Recommended annual ophthalmology exams for early detection of glaucoma and other disorders of the eye. Is the patient up to  date with their annual eye exam?  Yes  Who is the provider or what is the name of the office in which the patient attends annual eye exams? [facility] Dr gMonica Martinez If pt is not established with a provider, would they like to be referred to a provider to establish care? No .   Dental Screening: Recommended annual dental exams for proper oral hygiene  Community Resource Referral / Chronic Care Management: CRR required this visit?  No   CCM required this visit?  No      Plan:     I have personally reviewed and noted the following in the patient's chart:   Medical and social history Use of alcohol, tobacco or illicit drugs  Current medications and supplements including opioid prescriptions. Patient is not currently taking opioid prescriptions. Functional ability and status Nutritional status Physical activity Advanced directives List of other physicians Hospitalizations, surgeries, and ER visits in previous 12 months Vitals Screenings to include cognitive, depression, and falls Referrals and appointments  In addition, I have reviewed and discussed with patient certain preventive protocols, quality metrics, and best practice recommendations. A written personalized care plan for preventive services as well as general preventive health recommendations were provided to  patient.     Willette Brace, LPN   [id number]   Nurse Notes: none

## 2022-02-22 NOTE — Patient Instructions (Signed)
[name] , Thank you for taking time to come for your Medicare Wellness Visit. I appreciate your ongoing commitment to your health goals. Please review the following plan we discussed and let me know if I can assist you in the future.   These are the goals we discussed:  Goals      Patient Stated     Lose weight      Patient Stated     Lose weight and maintain health      [place] (see longitudinal plan of care for additional care plan information)  Current Barriers:  Chronic Disease Management support, education, and care coordination needs related to Hypertension, Hyperlipidemia, and Diabetes.   Hypertension BP Readings from Last 3 Encounters:  08/26/19 138/87  06/12/19 (!) 180/110  11/28/18 (!) 164/108  Pharmacist Clinical Goal(s): Over the next 180 days, patient will work with PharmD and providers to maintain BP goal <140/90 Current regimen:  Losartan-hydrochlorothiazide 100-25 mg once daily Metoprolol tartrate 50 mg twice daily Amlodipine 5 mg once daily Interventions: Home BP monitoring Diet recommendations Patient self care activities - Over the next 180 days, patient will: Check BP at least once every 1-2 weeks, document, and provide at future appointments Ensure daily salt intake < 2300 mg/day  Hyperlipidemia Lab Results  Component Value Date/Time   LDLCALC 65 10/18/2017 08:47 AM   LDLDIRECT 83.0 11/28/2018 08:55 AM  Pharmacist Clinical Goal(s): Over the next 180 days, patient will work with PharmD and providers to achieve LDL goal < 70 Current regimen:  Atorvastatin 10 mg once daily  Interventions: Diet/exercise recommendations Patient self care activities - Over the next 180 days, patient will: Continue with diet/exercise recommendations  Diabetes Lab Results  Component Value Date/Time   HGBA1C 6.9 (A) 06/12/2019 09:57 AM   HGBA1C 6.2 11/28/2018 08:55 AM   HGBA1C 7.1 09/10/2018 12:00 AM   HGBA1C 5.8 (A) 09/25/2017 01:18  PM   HGBA1C 5.9 04/23/2014 09:20 AM  Pharmacist Clinical Goal(s): Over the next 180 days, patient will work with PharmD and providers to maintain A1c goal <7% Current regimen:  Metformin XR 500 mg every morning, 1000 mg in evening Interventions: Blood glucose monitoring Patient self care activities - Over the next 180 days, patient will: Check blood sugar as directed, document, and provide at future appointments Contact provider with any episodes of hypoglycemia  Medication management Pharmacist Clinical Goal(s): Over the next 180 days, patient will work with PharmD and providers to maintain optimal medication adherence [facility] Interventions Comprehensive medication review performed. Continue current medication management strategy Patient self care activities - Over the next 180 days, patient will: Focus on medication adherence - Continue current management Report any questions or concerns to PharmD and/or provider(s)  Initial goal documentation      Track and Manage My Blood Pressure-Hypertension     Timeframe:  Long-Range Goal Priority:  High Start Date: 09/29/20                            Expected End Date: 03/29/21                      Follow Up Date 12/29/20    - check blood pressure weekly - choose a place to take my blood pressure (home, clinic or office, retail store) - write blood pressure results in a log or diary    Why is  this important?   You won't feel high blood pressure, but it can still hurt your blood vessels.  High blood pressure can cause heart or kidney problems. It can also cause a stroke.  Making lifestyle changes like losing a little weight or eating less salt will help.  Checking your blood pressure at home and at different times of the day can help to control blood pressure.  If the doctor prescribes medicine remember to take it the way the doctor ordered.  Call the office if you cannot afford the medicine or if there are  questions about it.     Notes:         This is a list of the screening recommended for you and due dates:  Health Maintenance  Topic Date Due   Complete foot exam   11/28/2019   Eye exam for diabetics  06/10/2022   Hemoglobin A1C  08/17/2022   Yearly kidney function blood test for diabetes  02/17/2023   Yearly kidney health urinalysis for diabetes  02/17/2023   Medicare Annual Wellness Visit  02/23/2023   DTaP/Tdap/Td vaccine (3 - Td or Tdap) 11/14/2023   Cologuard (Stool DNA test)  03/22/2024   Pneumonia Vaccine  Completed   Flu Shot  Completed   Hepatitis C Screening: USPSTF Recommendation to screen - Ages 21-79 yo.  Completed   Zoster (Shingles) Vaccine  Completed   HPV Vaccine  Aged Out   Colon Cancer Screening  Discontinued   COVID-19 Vaccine  Discontinued    Advanced directives: Please bring a copy of your health care power of attorney and living will to the office at your convenience.   Conditions/risks identified: lose weight and maintain health   Next appointment: Follow up in one year for your annual wellness visit.   Preventive Care 43 Years and Older, Male  Preventive care refers to lifestyle choices and visits with your health care provider that can promote health and wellness. What does preventive care include? A yearly physical exam. This is also called an annual well check. Dental exams once or twice a year. Routine eye exams. Ask your health care provider how often you should have your eyes checked. Personal lifestyle choices, including: Daily care of your teeth and gums. Regular physical activity. Eating a healthy diet. Avoiding tobacco and drug use. Limiting alcohol use. Practicing safe sex. Taking low doses of aspirin every day. Taking vitamin and mineral supplements as recommended by your health care provider. What happens during an annual well check? The services and screenings done by your health care provider during your annual well check  will depend on your age, overall health, lifestyle risk factors, and family history of disease. Counseling  Your health care provider may ask you questions about your: Alcohol use. Tobacco use. Drug use. Emotional well-being. Home and relationship well-being. Sexual activity. Eating habits. History of falls. Memory and ability to understand (cognition). Work and work Statistician. Screening  You may have the following tests or measurements: Height, weight, and BMI. Blood pressure. Lipid and cholesterol levels. These may be checked every 5 years, or more frequently if you are over 51 years old. Skin check. Lung cancer screening. You may have this screening every year starting at age 38 if you have a 30-pack-year history of smoking and currently smoke or have quit within the past 15 years. Fecal occult blood test (FOBT) of the stool. You may have this test every year starting at age 55. Flexible sigmoidoscopy or colonoscopy. You may have  a sigmoidoscopy every 5 years or a colonoscopy every 10 years starting at age 32. Prostate cancer screening. Recommendations will vary depending on your family history and other risks. Hepatitis C blood test. Hepatitis B blood test. Sexually transmitted disease (STD) testing. Diabetes screening. This is done by checking your blood sugar (glucose) after you have not eaten for a while (fasting). You may have this done every 1-3 years. Abdominal aortic aneurysm (AAA) screening. You may need this if you are a current or former smoker. Osteoporosis. You may be screened starting at age 74 if you are at high risk. Talk with your health care provider about your test results, treatment options, and if necessary, the need for more tests. Vaccines  Your health care provider may recommend certain vaccines, such as: Influenza vaccine. This is recommended every year. Tetanus, diphtheria, and acellular pertussis (Tdap, Td) vaccine. You may need a Td booster every 10  years. Zoster vaccine. You may need this after age 68. Pneumococcal 13-valent conjugate (PCV13) vaccine. One dose is recommended after age 75. Pneumococcal polysaccharide (PPSV23) vaccine. One dose is recommended after age 72. Talk to your health care provider about which screenings and vaccines you need and how often you need them. This information is not intended to replace advice given to you by your health care provider. Make sure you discuss any questions you have with your health care provider. Document Released: 01/23/2015 Document Revised: 09/16/2015 Document Reviewed: 10/28/2014 Elsevier Interactive Patient Education  2017 [place] Prevention in the Home Falls can cause injuries. They can happen to people of all ages. There are many things you can do to make your home safe and to help prevent falls. What can I do on the outside of my home? Regularly fix the edges of walkways and driveways and fix any cracks. Remove anything that might make you trip as you walk through a door, such as a raised step or threshold. Trim any bushes or trees on the path to your home. Use bright outdoor lighting. Clear any walking paths of anything that might make someone trip, such as rocks or tools. Regularly check to see if handrails are loose or broken. Make sure that both sides of any steps have handrails. Any raised decks and porches should have guardrails on the edges. Have any leaves, snow, or ice cleared regularly. Use sand or salt on walking paths during winter. Clean up any spills in your garage right away. This includes oil or grease spills. What can I do in the bathroom? Use night lights. Install grab bars by the toilet and in the tub and shower. Do not use towel bars as grab bars. Use non-skid mats or decals in the tub or shower. If you need to sit down in the shower, use a plastic, non-slip stool. Keep the floor dry. Clean up any water that spills on the floor as soon as it  happens. Remove soap buildup in the tub or shower regularly. Attach bath mats securely with double-sided non-slip rug tape. Do not have throw rugs and other things on the floor that can make you trip. What can I do in the bedroom? Use night lights. Make sure that you have a light by your bed that is easy to reach. Do not use any sheets or blankets that are too big for your bed. They should not hang down onto the floor. Have a firm chair that has side arms. You can use this for support while you get dressed. Do  not have throw rugs and other things on the floor that can make you trip. What can I do in the kitchen? Clean up any spills right away. Avoid walking on wet floors. Keep items that you use a lot in easy-to-reach places. If you need to reach something above you, use a strong step stool that has a grab bar. Keep electrical cords out of the way. Do not use floor polish or wax that makes floors slippery. If you must use wax, use non-skid floor wax. Do not have throw rugs and other things on the floor that can make you trip. What can I do with my stairs? Do not leave any items on the stairs. Make sure that there are handrails on both sides of the stairs and use them. Fix handrails that are broken or loose. Make sure that handrails are as long as the stairways. Check any carpeting to make sure that it is firmly attached to the stairs. Fix any carpet that is loose or worn. Avoid having throw rugs at the top or bottom of the stairs. If you do have throw rugs, attach them to the floor with carpet tape. Make sure that you have a light switch at the top of the stairs and the bottom of the stairs. If you do not have them, ask someone to add them for you. What else can I do to help prevent falls? Wear shoes that: Do not have high heels. Have rubber bottoms. Are comfortable and fit you well. Are closed at the toe. Do not wear sandals. If you use a stepladder: Make sure that it is fully opened.  Do not climb a closed stepladder. Make sure that both sides of the stepladder are locked into place. Ask someone to hold it for you, if possible. Clearly mark and make sure that you can see: Any grab bars or handrails. First and last steps. Where the edge of each step is. Use tools that help you move around (mobility aids) if they are needed. These include: Canes. Walkers. Scooters. Crutches. Turn on the lights when you go into a dark area. Replace any light bulbs as soon as they burn out. Set up your furniture so you have a clear path. Avoid moving your furniture around. If any of your floors are uneven, fix them. If there are any pets around you, be aware of where they are. Review your medicines with your doctor. Some medicines can make you feel dizzy. This can increase your chance of falling. Ask your doctor what other things that you can do to help prevent falls. This information is not intended to replace advice given to you by your health care provider. Make sure you discuss any questions you have with your health care provider. Document Released: 10/23/2008 Document Revised: 06/04/2015 Document Reviewed: 01/31/2014 Elsevier Interactive Patient Education  2017 [employer].

## 2022-03-13 ENCOUNTER — Other Ambulatory Visit: Payer: Self-pay | Admitting: Family Medicine

## 2022-03-25 ENCOUNTER — Other Ambulatory Visit: Payer: Self-pay | Admitting: Family Medicine

## 2022-04-19 ENCOUNTER — Telehealth: Payer: Self-pay | Admitting: Pharmacist

## 2022-04-19 NOTE — Progress Notes (Unsigned)
Care Management & Coordination Services Pharmacy Team  Reason for Encounter: Hypertension  Contacted patient to discuss hypertension disease state. {US HC Outreach:28874}    Current antihypertensive regimen:  Amlodipine 10 mg daily Losartan-hctz 100-25 mg daily Metformin 500 mg twice daily  Patient verbally confirms he is taking the above medications as directed. {yes/no:20286}  How often are you checking your Blood Pressure? {CHL HP BP Monitoring Frequency:[phone]}  he checks his blood pressure {timing:25218} {before/after:25217} taking his medication.  Current home BP readings: ***  DATE:             BP               PULSE   Wrist or arm cuff: Caffeine intake: Salt intake: OTC medications including pseudoephedrine or NSAIDs?  Any readings above 180/100? {yes/no:20286} If yes any symptoms of hypertensive emergency? {hypertensive emergency symptoms:25354}  What recent interventions/DTPs have been made by any provider to improve Blood Pressure control since last CPP Visit: ***  Any recent hospitalizations or ED visits since last visit with CPP? {yes/no:20286}  What diet changes have been made to improve Blood Pressure Control?  ***  What exercise is being done to improve your Blood Pressure Control?  ***  Adherence Review: Is the patient currently on ACE/ARB medication? {yes/no:20286} Does the patient have >5 day gap between last estimated fill dates? {yes/no:20286}  Star Rating Drugs:  Atorvastatin 10 mg last filled 02/22/2022 90 DS Losartan-hctz 100-25 mg last filled 03/25/2022 90 DS Metformin 500 mg last filled 04/12/2022 90 DS   Chart Updates: Recent office visits:  02/16/2022 [facility] (PCP) Ardith Dark, MD; no medication changes indicated.  Recent consult visits:  None  Hospital visits:  None in previous 6 months  Medications: Outpatient Encounter Medications as of 04/19/2022  Medication Sig   Accu-Chek Softclix Lancets lancets USE TO CHECK BLOOD SUGAR  1 TIME PER DAY   allopurinol (ZYLOPRIM) 100 MG tablet TAKE 1/2 TABLET BY MOUTH DAILY   amLODipine (NORVASC) 10 MG tablet TAKE ONE TABLET BY MOUTH DAILY   aspirin 81 MG tablet Take 81 mg by mouth daily.   atorvastatin (LIPITOR) 10 MG tablet TAKE ONE TABLET BY MOUTH DAILY   Azelastine HCl 137 MCG/SPRAY SOLN SPRAY TWO SPRAYS IN EACH NOSTRIL TWICE DAILY   Fish Oil-Cholecalciferol (FISH OIL + D3 PO) Take by mouth.   glucose blood (ACCU-CHEK AVIVA PLUS) test strip USE TO CHECK BLOOD SUGAR 1 TIME PER DAY   latanoprost (XALATAN) 0.005 % ophthalmic solution SMARTSIG:In Eye(s)   levothyroxine (SYNTHROID) 137 MCG tablet TAKE ONE TABLET BY MOUTH DAILY BEFORE BREAKFAST   losartan-hydrochlorothiazide (HYZAAR) 100-25 MG tablet TAKE 1 TABLET BY MOUTH DAILY   metFORMIN (GLUCOPHAGE-XR) 500 MG 24 hr tablet TAKE ONE TABLET BY MOUTH EVERY MORNING WITH BREAKFAST AND TAKE TWO TABLETS BY MOUTH EVERY EVENING WITH DINNER   metoprolol tartrate (LOPRESSOR) 50 MG tablet TAKE ONE TABLET BY MOUTH TWICE A DAY   Multiple Vitamins-Minerals (MULTI COMPLETE PO) Take by mouth.   potassium chloride (KLOR-CON) 8 MEQ tablet TAKE 1 TABLET BY MOUTH DAILY   timolol (TIMOPTIC) 0.5 % ophthalmic solution    No facility-administered encounter medications on file as of 04/19/2022.    Recent Office Vitals: BP Readings from Last 3 Encounters:  02/16/22 (!) 178/99  08/01/21 127/68  07/19/21 (!) 150/90   Pulse Readings from Last 3 Encounters:  02/16/22 77  07/19/21 72  12/16/20 78    Wt Readings from Last 3 Encounters:  02/16/22 213 lb 9.6  oz (96.9 kg)  07/19/21 213 lb 9.6 oz (96.9 kg)  12/16/20 217 lb (98.4 kg)     Kidney Function Lab Results  Component Value Date/Time   CREATININE 1.17 02/16/2022 08:31 AM   CREATININE 1.13 12/16/2020 09:04 AM   CREATININE 1.16 12/11/2019 09:33 AM   GFR 61.32 02/16/2022 08:31 AM   GFRNONAA 53.25 06/29/2009 08:48 AM   GFRAA 62 04/27/2007 07:47 AM       Latest Ref Rng & Units 02/16/2022     8:31 AM 12/16/2020    9:04 AM 12/11/2019    9:33 AM  BMP  Glucose 70 - 99 mg/dL 811  031  594   BUN 6 - 23 mg/dL 15  16  20    Creatinine 0.40 - 1.50 mg/dL 5.85  9.29  2.44   BUN/Creat Ratio 6 - 22 (calc)   NOT APPLICABLE   Sodium 135 - 145 mEq/L 137  141  137   Potassium 3.5 - 5.1 mEq/L 4.3  4.3  4.3   Chloride 96 - 112 mEq/L 99  99  98   CO2 19 - 32 mEq/L 29  32  30   Calcium 8.4 - 10.5 mg/dL 62.8  63.8  17.7     Future Appointments  Date Time Provider Department Center  08/17/2022  9:00 AM Ardith Dark, MD [facility]  02/13/2023  2:00 PM [name], [facility] None  02/28/2023  8:15 AM [facility] ANNUAL WELLNESS VISIT 1 [facility]   April D Calhoun, [facility] Clinical Pharmacist Assistant [phone]

## 2022-06-21 ENCOUNTER — Other Ambulatory Visit: Payer: Self-pay | Admitting: Family Medicine

## 2022-07-11 ENCOUNTER — Other Ambulatory Visit: Payer: Self-pay | Admitting: Family Medicine

## 2022-08-10 DIAGNOSIS — H35033 Hypertensive retinopathy, bilateral: Secondary | ICD-10-CM | POA: Diagnosis not present

## 2022-08-10 DIAGNOSIS — H401131 Primary open-angle glaucoma, bilateral, mild stage: Secondary | ICD-10-CM | POA: Diagnosis not present

## 2022-08-10 DIAGNOSIS — H25813 Combined forms of age-related cataract, bilateral: Secondary | ICD-10-CM | POA: Diagnosis not present

## 2022-08-10 DIAGNOSIS — E119 Type 2 diabetes mellitus without complications: Secondary | ICD-10-CM | POA: Diagnosis not present

## 2022-08-17 ENCOUNTER — Encounter: Payer: Self-pay | Admitting: Family Medicine

## 2022-08-17 ENCOUNTER — Ambulatory Visit (INDEPENDENT_AMBULATORY_CARE_PROVIDER_SITE_OTHER): Payer: Medicare HMO | Admitting: Family Medicine

## 2022-08-17 VITALS — BP 187/95 | HR 80 | Temp 97.7°F | Ht 69.0 in | Wt 215.4 lb

## 2022-08-17 DIAGNOSIS — E1159 Type 2 diabetes mellitus with other circulatory complications: Secondary | ICD-10-CM | POA: Diagnosis not present

## 2022-08-17 DIAGNOSIS — N183 Chronic kidney disease, stage 3 unspecified: Secondary | ICD-10-CM

## 2022-08-17 DIAGNOSIS — Z7984 Long term (current) use of oral hypoglycemic drugs: Secondary | ICD-10-CM | POA: Diagnosis not present

## 2022-08-17 DIAGNOSIS — E785 Hyperlipidemia, unspecified: Secondary | ICD-10-CM

## 2022-08-17 DIAGNOSIS — I152 Hypertension secondary to endocrine disorders: Secondary | ICD-10-CM | POA: Diagnosis not present

## 2022-08-17 DIAGNOSIS — E1169 Type 2 diabetes mellitus with other specified complication: Secondary | ICD-10-CM

## 2022-08-17 DIAGNOSIS — H9319 Tinnitus, unspecified ear: Secondary | ICD-10-CM

## 2022-08-17 LAB — POCT GLYCOSYLATED HEMOGLOBIN (HGB A1C): Hemoglobin A1C: 8.4 % — AB (ref 4.0–5.6)

## 2022-08-17 NOTE — Assessment & Plan Note (Signed)
Elevated today.  Home readings are typically in the 120s to 130s over 80s range.  He will continue to monitor at home.  He does have whitecoat hypertension.  He will send [place] a message in a few weeks with his home blood pressure readings.  Will continue amlodipine 10 mg daily, losartan-HCTZ 100-25 once daily, and metoprolol tartrate 50 mg twice daily.  Consider increasing dose of metoprolol if blood pressure continues to be elevated or is elevated at home.

## 2022-08-17 NOTE — Progress Notes (Signed)
   Paul Banks is a 77 y.o. male who presents today for an office visit.  Assessment/Plan:  New/Acute Problems: Tinnitus  May be related to recent aspirin use.  Hopefully this will continue to improve over the next several weeks though he did fail his hearing screening today.  Will place referral to audiology for further evaluation and management.  Chronic Problems Addressed Today: Diabetes (HCC) A1c up slightly to to 8.4. We did discuss increased his dose of metformin however he would like to defer for now. He would like to continue working on his diet and exercise. We will continue his metformin 1500 mg total daily. Recheck A1c in 3-6 months.   Hypertension associated with diabetes (HCC) Elevated today.  Home readings are typically in the 120s to 130s over 80s range.  He will continue to monitor at home.  He does have whitecoat hypertension.  He will send [place] a message in a few weeks with his home blood pressure readings.  Will continue amlodipine 10 mg daily, losartan-HCTZ 100-25 once daily, and metoprolol tartrate 50 mg twice daily.  Consider increasing dose of metoprolol if blood pressure continues to be elevated or is elevated at home.     Subjective:  HPI:  See A/P for status of chronic conditions.  Patient is here today for follow-up.  We saw him 6 months ago for annual physical.  At that time A1c was 7.5.  We continued him on metformin 500 mg in the morning and 1000 mg in the evening.  Other labs at that time were stable as well.  He has been having some ringing in his ear for the last month. Located in both ears. He has been having some allergies and sneezing more but ringing started randomly. Hearing seems to be normal. No dizziness.  He did take a couple of aspirins a day before his tinnitus started.       Objective:  Physical Exam: BP (!) 187/95   Pulse 80   Temp 97.7 F (36.5 C) (Temporal)   Ht 5\' 9"  (1.753 m)   Wt 215 lb 6.4 oz (97.7 kg)   SpO2 98%   BMI 31.81  kg/m   Gen: No acute distress, resting comfortably HEENT: TMs clear bilaterally. CV: Regular rate and rhythm with no murmurs appreciated Pulm: Normal work of breathing, clear to auscultation bilaterally with no crackles, wheezes, or rhonchi Neuro: Grossly normal, moves all extremities Psych: Normal affect and thought content       M. Jimmey Ralph, MD 08/17/2022 9:47 AM

## 2022-08-17 NOTE — Assessment & Plan Note (Signed)
A1c up slightly to to 8.4. We did discuss increased his dose of metformin however he would like to defer for now. He would like to continue working on his diet and exercise. We will continue his metformin 1500 mg total daily. Recheck A1c in 3-6 months.

## 2022-08-17 NOTE — Patient Instructions (Addendum)
It was very nice to see you today!  Your A1c today is elevated to 8.4.  Please continue to work on your diet and exercise.  We will refer you to audiology for the ringing in your ear.  Please monitor your blood pressure at home and let us know if it is persistently elevated.  Return in about 6 months (around 02/17/2023) for Annual Physical.   Take care, Dr Jimmey Ralph  PLEASE NOTE:  If you had any lab tests, please let us know if you have not heard back within a few days. You may see your results on mychart before we have a chance to review them but we will give you a call once they are reviewed by [place].   If we ordered any referrals today, please let us know if you have not heard from their office within the next week.   If you had any urgent prescriptions sent in today, please check with the pharmacy within an hour of our visit to make sure the prescription was transmitted appropriately.   Please try these tips to maintain a healthy lifestyle:  Eat at least 3 REAL meals and 1-2 snacks per day.  Aim for no more than 5 hours between eating.  If you eat breakfast, please do so within one hour of getting up.   Each meal should contain half fruits/vegetables, one quarter protein, and one quarter carbs (no bigger than a computer mouse)  Cut down on sweet beverages. This includes juice, soda, and sweet tea.   Drink at least 1 glass of water with each meal and aim for at least 8 glasses per day  Exercise at least 150 minutes every week.

## 2022-08-29 ENCOUNTER — Telehealth: Payer: Self-pay | Admitting: Family Medicine

## 2022-09-11 NOTE — Telephone Encounter (Signed)
Paramedic stated. 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

## 2022-09-11 NOTE — Telephone Encounter (Signed)
[facility] called and would like to talk to Dr Jimmey Ralph about pt. Please advise.

## 2022-09-11 DEATH — deceased

## 2022-09-21 ENCOUNTER — Ambulatory Visit: Payer: Medicare HMO | Admitting: Audiologist

## 2023-02-13 ENCOUNTER — Encounter: Payer: Medicare HMO | Admitting: Pharmacist

## 2023-02-20 ENCOUNTER — Encounter: Payer: Self-pay | Admitting: Family Medicine
